# Patient Record
Sex: Female | Born: 1960 | Race: Black or African American | Hispanic: No | Marital: Single | Smoking: Never smoker
Health system: Southern US, Community
[De-identification: ages and names within clinical notes are randomized; demographics above are authoritative.]

## PROBLEM LIST (undated history)

## (undated) DIAGNOSIS — R0600 Dyspnea, unspecified: Secondary | ICD-10-CM

## (undated) DIAGNOSIS — K219 Gastro-esophageal reflux disease without esophagitis: Secondary | ICD-10-CM

## (undated) DIAGNOSIS — G4730 Sleep apnea, unspecified: Secondary | ICD-10-CM

## (undated) DIAGNOSIS — J189 Pneumonia, unspecified organism: Secondary | ICD-10-CM

## (undated) DIAGNOSIS — F419 Anxiety disorder, unspecified: Secondary | ICD-10-CM

## (undated) DIAGNOSIS — F32A Depression, unspecified: Secondary | ICD-10-CM

## (undated) DIAGNOSIS — M79672 Pain in left foot: Secondary | ICD-10-CM

## (undated) DIAGNOSIS — M7732 Calcaneal spur, left foot: Secondary | ICD-10-CM

## (undated) DIAGNOSIS — I10 Essential (primary) hypertension: Secondary | ICD-10-CM

## (undated) DIAGNOSIS — IMO0001 Reserved for inherently not codable concepts without codable children: Secondary | ICD-10-CM

## (undated) DIAGNOSIS — T7840XA Allergy, unspecified, initial encounter: Secondary | ICD-10-CM

## (undated) DIAGNOSIS — I639 Cerebral infarction, unspecified: Secondary | ICD-10-CM

## (undated) DIAGNOSIS — M7662 Achilles tendinitis, left leg: Secondary | ICD-10-CM

## (undated) DIAGNOSIS — D649 Anemia, unspecified: Secondary | ICD-10-CM

## (undated) DIAGNOSIS — M47816 Spondylosis without myelopathy or radiculopathy, lumbar region: Secondary | ICD-10-CM

## (undated) DIAGNOSIS — I499 Cardiac arrhythmia, unspecified: Secondary | ICD-10-CM

## (undated) DIAGNOSIS — Z6841 Body Mass Index (BMI) 40.0 and over, adult: Secondary | ICD-10-CM

## (undated) DIAGNOSIS — E669 Obesity, unspecified: Secondary | ICD-10-CM

## (undated) DIAGNOSIS — I82401 Acute embolism and thrombosis of unspecified deep veins of right lower extremity: Secondary | ICD-10-CM

## (undated) DIAGNOSIS — J45909 Unspecified asthma, uncomplicated: Secondary | ICD-10-CM

## (undated) DIAGNOSIS — M216X1 Other acquired deformities of right foot: Secondary | ICD-10-CM

## (undated) DIAGNOSIS — E785 Hyperlipidemia, unspecified: Secondary | ICD-10-CM

## (undated) DIAGNOSIS — M1990 Unspecified osteoarthritis, unspecified site: Secondary | ICD-10-CM

## (undated) DIAGNOSIS — E66813 Obesity, class 3: Secondary | ICD-10-CM

## (undated) DIAGNOSIS — R7303 Prediabetes: Secondary | ICD-10-CM

## (undated) HISTORY — DX: Anemia, unspecified: D64.9

## (undated) HISTORY — PX: ABDOMINAL HYSTERECTOMY: SHX81

## (undated) HISTORY — PX: KELOID EXCISION: SHX1856

## (undated) HISTORY — DX: Allergy, unspecified, initial encounter: T78.40XA

## (undated) HISTORY — DX: Obesity, unspecified: E66.9

## (undated) HISTORY — PX: HEEL SPUR EXCISION: SHX1733

## (undated) HISTORY — DX: Essential (primary) hypertension: I10

## (undated) HISTORY — DX: Gastro-esophageal reflux disease without esophagitis: K21.9

## (undated) HISTORY — DX: Acute embolism and thrombosis of unspecified deep veins of right lower extremity: I82.401

## (undated) HISTORY — PX: HERNIA REPAIR: SHX51

## (undated) HISTORY — DX: Spondylosis without myelopathy or radiculopathy, lumbar region: M47.816

## (undated) HISTORY — DX: Hyperlipidemia, unspecified: E78.5

## (undated) HISTORY — DX: Reserved for inherently not codable concepts without codable children: IMO0001

## (undated) HISTORY — PX: CHOLECYSTECTOMY: SHX55

---

## 2005-01-11 ENCOUNTER — Emergency Department: Payer: Self-pay | Admitting: Emergency Medicine

## 2005-01-19 ENCOUNTER — Ambulatory Visit: Payer: Self-pay | Admitting: Unknown Physician Specialty

## 2005-01-26 ENCOUNTER — Ambulatory Visit: Payer: Self-pay | Admitting: Family Medicine

## 2006-02-12 ENCOUNTER — Emergency Department: Payer: Self-pay | Admitting: General Practice

## 2006-04-18 ENCOUNTER — Other Ambulatory Visit: Payer: Self-pay

## 2006-04-18 ENCOUNTER — Inpatient Hospital Stay: Payer: Self-pay

## 2006-11-26 ENCOUNTER — Emergency Department: Payer: Self-pay | Admitting: Unknown Physician Specialty

## 2006-12-13 ENCOUNTER — Ambulatory Visit: Payer: Self-pay

## 2007-02-22 ENCOUNTER — Emergency Department (HOSPITAL_COMMUNITY): Admission: EM | Admit: 2007-02-22 | Discharge: 2007-02-22 | Payer: Self-pay | Admitting: Emergency Medicine

## 2007-06-06 ENCOUNTER — Emergency Department (HOSPITAL_COMMUNITY): Admission: EM | Admit: 2007-06-06 | Discharge: 2007-06-06 | Payer: Self-pay | Admitting: Emergency Medicine

## 2007-07-14 ENCOUNTER — Encounter (INDEPENDENT_AMBULATORY_CARE_PROVIDER_SITE_OTHER): Payer: Self-pay | Admitting: Nurse Practitioner

## 2007-07-21 ENCOUNTER — Ambulatory Visit: Payer: Self-pay | Admitting: Nurse Practitioner

## 2007-07-21 DIAGNOSIS — L910 Hypertrophic scar: Secondary | ICD-10-CM | POA: Insufficient documentation

## 2007-07-21 DIAGNOSIS — J45909 Unspecified asthma, uncomplicated: Secondary | ICD-10-CM | POA: Insufficient documentation

## 2007-07-21 DIAGNOSIS — I10 Essential (primary) hypertension: Secondary | ICD-10-CM | POA: Insufficient documentation

## 2007-09-06 ENCOUNTER — Ambulatory Visit: Payer: Self-pay | Admitting: Nurse Practitioner

## 2007-09-06 DIAGNOSIS — K219 Gastro-esophageal reflux disease without esophagitis: Secondary | ICD-10-CM

## 2007-09-06 LAB — CONVERTED CEMR LAB
ALT: 14 units/L (ref 0–35)
AST: 16 units/L (ref 0–37)
Alkaline Phosphatase: 48 units/L (ref 39–117)
Basophils Absolute: 0 10*3/uL (ref 0.0–0.1)
Chloride: 105 meq/L (ref 96–112)
Creatinine, Ser: 0.87 mg/dL (ref 0.40–1.20)
Eosinophils Absolute: 0.2 10*3/uL (ref 0.0–0.7)
Eosinophils Relative: 4 % (ref 0–5)
HDL: 64 mg/dL (ref >39)
LDL Cholesterol: 114 mg/dL — ABNORMAL HIGH (ref 0–99)
Lymphs Abs: 2.1 10*3/uL (ref 0.7–4.0)
MCHC: 33 g/dL (ref 30.0–36.0)
MCV: 80.1 fL (ref 78.0–100.0)
Monocytes Absolute: 0.4 10*3/uL (ref 0.1–1.0)
Monocytes Relative: 8 % (ref 3–12)
Neutro Abs: 2 10*3/uL (ref 1.7–7.7)
Neutrophils Relative %: 42 % — ABNORMAL LOW (ref 43–77)
Platelets: 225 10*3/uL (ref 150–400)
Potassium: 4.2 meq/L (ref 3.5–5.3)
RBC: 4.47 M/uL (ref 3.87–5.11)
RDW: 17.1 % — ABNORMAL HIGH (ref 11.5–15.5)
Total CHOL/HDL Ratio: 3
Triglycerides: 71 mg/dL (ref <150)
VLDL: 14 mg/dL (ref 0–40)

## 2007-10-31 ENCOUNTER — Ambulatory Visit (HOSPITAL_COMMUNITY): Admission: RE | Admit: 2007-10-31 | Discharge: 2007-10-31 | Payer: Self-pay | Admitting: Nurse Practitioner

## 2007-11-10 ENCOUNTER — Encounter (INDEPENDENT_AMBULATORY_CARE_PROVIDER_SITE_OTHER): Payer: Self-pay | Admitting: Nurse Practitioner

## 2007-11-15 ENCOUNTER — Ambulatory Visit: Payer: Self-pay | Admitting: Nurse Practitioner

## 2007-11-15 DIAGNOSIS — D649 Anemia, unspecified: Secondary | ICD-10-CM

## 2007-11-15 DIAGNOSIS — M1710 Unilateral primary osteoarthritis, unspecified knee: Secondary | ICD-10-CM

## 2007-11-20 ENCOUNTER — Ambulatory Visit (HOSPITAL_COMMUNITY): Admission: RE | Admit: 2007-11-20 | Discharge: 2007-11-20 | Payer: Self-pay | Admitting: Family Medicine

## 2007-11-20 DIAGNOSIS — IMO0002 Reserved for concepts with insufficient information to code with codable children: Secondary | ICD-10-CM | POA: Insufficient documentation

## 2007-11-27 ENCOUNTER — Ambulatory Visit: Payer: Self-pay | Admitting: Nurse Practitioner

## 2007-11-27 DIAGNOSIS — F329 Major depressive disorder, single episode, unspecified: Secondary | ICD-10-CM

## 2007-11-28 ENCOUNTER — Telehealth (INDEPENDENT_AMBULATORY_CARE_PROVIDER_SITE_OTHER): Payer: Self-pay | Admitting: Nurse Practitioner

## 2007-12-07 ENCOUNTER — Encounter (INDEPENDENT_AMBULATORY_CARE_PROVIDER_SITE_OTHER): Payer: Self-pay | Admitting: Nurse Practitioner

## 2007-12-08 ENCOUNTER — Ambulatory Visit: Payer: Self-pay | Admitting: Nurse Practitioner

## 2007-12-21 ENCOUNTER — Ambulatory Visit: Payer: Self-pay | Admitting: Nurse Practitioner

## 2007-12-26 ENCOUNTER — Ambulatory Visit: Payer: Self-pay | Admitting: *Deleted

## 2008-01-05 ENCOUNTER — Ambulatory Visit: Payer: Self-pay | Admitting: Nurse Practitioner

## 2008-01-16 ENCOUNTER — Telehealth (INDEPENDENT_AMBULATORY_CARE_PROVIDER_SITE_OTHER): Payer: Self-pay | Admitting: Nurse Practitioner

## 2008-02-01 ENCOUNTER — Encounter (INDEPENDENT_AMBULATORY_CARE_PROVIDER_SITE_OTHER): Payer: Self-pay | Admitting: Nurse Practitioner

## 2008-02-19 ENCOUNTER — Telehealth (INDEPENDENT_AMBULATORY_CARE_PROVIDER_SITE_OTHER): Payer: Self-pay | Admitting: Nurse Practitioner

## 2008-02-23 ENCOUNTER — Ambulatory Visit: Payer: Self-pay | Admitting: Nurse Practitioner

## 2008-02-23 DIAGNOSIS — K5289 Other specified noninfective gastroenteritis and colitis: Secondary | ICD-10-CM | POA: Insufficient documentation

## 2008-02-26 ENCOUNTER — Encounter (INDEPENDENT_AMBULATORY_CARE_PROVIDER_SITE_OTHER): Payer: Self-pay | Admitting: Nurse Practitioner

## 2008-02-26 ENCOUNTER — Ambulatory Visit: Payer: Self-pay | Admitting: Internal Medicine

## 2008-02-28 ENCOUNTER — Telehealth (INDEPENDENT_AMBULATORY_CARE_PROVIDER_SITE_OTHER): Payer: Self-pay | Admitting: Nurse Practitioner

## 2008-03-13 ENCOUNTER — Other Ambulatory Visit: Payer: Self-pay | Admitting: Orthopedic Surgery

## 2008-03-14 ENCOUNTER — Ambulatory Visit (HOSPITAL_BASED_OUTPATIENT_CLINIC_OR_DEPARTMENT_OTHER): Admission: RE | Admit: 2008-03-14 | Discharge: 2008-03-14 | Payer: Self-pay | Admitting: Orthopedic Surgery

## 2008-03-15 ENCOUNTER — Telehealth (INDEPENDENT_AMBULATORY_CARE_PROVIDER_SITE_OTHER): Payer: Self-pay | Admitting: Nurse Practitioner

## 2008-03-20 ENCOUNTER — Ambulatory Visit: Payer: Self-pay

## 2008-03-29 ENCOUNTER — Telehealth (INDEPENDENT_AMBULATORY_CARE_PROVIDER_SITE_OTHER): Payer: Self-pay | Admitting: Nurse Practitioner

## 2008-04-04 ENCOUNTER — Encounter: Admission: RE | Admit: 2008-04-04 | Discharge: 2008-05-29 | Payer: Self-pay | Admitting: Orthopedic Surgery

## 2008-04-24 ENCOUNTER — Telehealth (INDEPENDENT_AMBULATORY_CARE_PROVIDER_SITE_OTHER): Payer: Self-pay | Admitting: Nurse Practitioner

## 2008-04-30 ENCOUNTER — Ambulatory Visit: Payer: Self-pay | Admitting: Nurse Practitioner

## 2008-05-02 ENCOUNTER — Telehealth (INDEPENDENT_AMBULATORY_CARE_PROVIDER_SITE_OTHER): Payer: Self-pay | Admitting: Nurse Practitioner

## 2008-05-07 ENCOUNTER — Encounter (INDEPENDENT_AMBULATORY_CARE_PROVIDER_SITE_OTHER): Payer: Self-pay | Admitting: Nurse Practitioner

## 2008-05-07 DIAGNOSIS — K921 Melena: Secondary | ICD-10-CM

## 2008-05-07 LAB — CONVERTED CEMR LAB
OCCULT 1: POSITIVE
OCCULT 2: POSITIVE

## 2008-05-20 ENCOUNTER — Ambulatory Visit: Payer: Self-pay | Admitting: Internal Medicine

## 2008-05-30 ENCOUNTER — Encounter: Payer: Self-pay | Admitting: Internal Medicine

## 2008-05-30 ENCOUNTER — Ambulatory Visit: Payer: Self-pay | Admitting: Internal Medicine

## 2008-05-31 ENCOUNTER — Encounter: Payer: Self-pay | Admitting: Internal Medicine

## 2008-06-08 ENCOUNTER — Emergency Department (HOSPITAL_COMMUNITY): Admission: EM | Admit: 2008-06-08 | Discharge: 2008-06-09 | Payer: Self-pay | Admitting: Emergency Medicine

## 2008-06-19 ENCOUNTER — Ambulatory Visit: Payer: Self-pay | Admitting: Nurse Practitioner

## 2008-06-21 ENCOUNTER — Telehealth (INDEPENDENT_AMBULATORY_CARE_PROVIDER_SITE_OTHER): Payer: Self-pay | Admitting: Nurse Practitioner

## 2008-07-12 ENCOUNTER — Ambulatory Visit: Payer: Self-pay | Admitting: Nurse Practitioner

## 2008-07-12 LAB — CONVERTED CEMR LAB
Bilirubin Urine: NEGATIVE
Ketones, urine, test strip: NEGATIVE
Specific Gravity, Urine: 1.015
pH: 5

## 2008-07-13 ENCOUNTER — Encounter (INDEPENDENT_AMBULATORY_CARE_PROVIDER_SITE_OTHER): Payer: Self-pay | Admitting: Nurse Practitioner

## 2008-07-15 ENCOUNTER — Telehealth (INDEPENDENT_AMBULATORY_CARE_PROVIDER_SITE_OTHER): Payer: Self-pay | Admitting: *Deleted

## 2008-07-22 ENCOUNTER — Encounter (INDEPENDENT_AMBULATORY_CARE_PROVIDER_SITE_OTHER): Payer: Self-pay | Admitting: Nurse Practitioner

## 2008-08-05 ENCOUNTER — Encounter (INDEPENDENT_AMBULATORY_CARE_PROVIDER_SITE_OTHER): Payer: Self-pay | Admitting: Nurse Practitioner

## 2008-09-13 ENCOUNTER — Telehealth (INDEPENDENT_AMBULATORY_CARE_PROVIDER_SITE_OTHER): Payer: Self-pay | Admitting: Nurse Practitioner

## 2008-09-16 ENCOUNTER — Telehealth (INDEPENDENT_AMBULATORY_CARE_PROVIDER_SITE_OTHER): Payer: Self-pay | Admitting: Nurse Practitioner

## 2008-09-18 ENCOUNTER — Emergency Department (HOSPITAL_COMMUNITY): Admission: EM | Admit: 2008-09-18 | Discharge: 2008-09-18 | Payer: Self-pay | Admitting: Emergency Medicine

## 2008-10-02 ENCOUNTER — Encounter (INDEPENDENT_AMBULATORY_CARE_PROVIDER_SITE_OTHER): Payer: Self-pay | Admitting: *Deleted

## 2008-10-03 ENCOUNTER — Ambulatory Visit: Payer: Self-pay | Admitting: Nurse Practitioner

## 2008-10-03 DIAGNOSIS — R5381 Other malaise: Secondary | ICD-10-CM

## 2008-10-03 DIAGNOSIS — R0989 Other specified symptoms and signs involving the circulatory and respiratory systems: Secondary | ICD-10-CM

## 2008-10-03 DIAGNOSIS — R0609 Other forms of dyspnea: Secondary | ICD-10-CM

## 2008-10-03 DIAGNOSIS — R5383 Other fatigue: Secondary | ICD-10-CM

## 2008-10-03 LAB — CONVERTED CEMR LAB: Blood Glucose, Fingerstick: 69

## 2008-10-08 ENCOUNTER — Encounter (INDEPENDENT_AMBULATORY_CARE_PROVIDER_SITE_OTHER): Payer: Self-pay | Admitting: Nurse Practitioner

## 2008-10-08 ENCOUNTER — Telehealth (INDEPENDENT_AMBULATORY_CARE_PROVIDER_SITE_OTHER): Payer: Self-pay | Admitting: *Deleted

## 2008-10-10 ENCOUNTER — Ambulatory Visit (HOSPITAL_BASED_OUTPATIENT_CLINIC_OR_DEPARTMENT_OTHER): Admission: RE | Admit: 2008-10-10 | Discharge: 2008-10-10 | Payer: Self-pay | Admitting: Nurse Practitioner

## 2008-10-10 ENCOUNTER — Encounter (INDEPENDENT_AMBULATORY_CARE_PROVIDER_SITE_OTHER): Payer: Self-pay | Admitting: Nurse Practitioner

## 2008-10-13 ENCOUNTER — Ambulatory Visit: Payer: Self-pay | Admitting: Internal Medicine

## 2008-10-18 ENCOUNTER — Encounter (INDEPENDENT_AMBULATORY_CARE_PROVIDER_SITE_OTHER): Payer: Self-pay | Admitting: Nurse Practitioner

## 2008-10-21 ENCOUNTER — Telehealth (INDEPENDENT_AMBULATORY_CARE_PROVIDER_SITE_OTHER): Payer: Self-pay | Admitting: Nurse Practitioner

## 2009-04-23 ENCOUNTER — Ambulatory Visit: Payer: Self-pay | Admitting: Family Medicine

## 2009-08-25 ENCOUNTER — Encounter: Payer: Self-pay | Admitting: Family Medicine

## 2009-09-06 ENCOUNTER — Encounter: Payer: Self-pay | Admitting: Family Medicine

## 2009-11-28 ENCOUNTER — Emergency Department: Payer: Self-pay | Admitting: Emergency Medicine

## 2010-04-29 ENCOUNTER — Ambulatory Visit: Payer: Self-pay | Admitting: Certified Nurse Midwife

## 2010-07-07 ENCOUNTER — Ambulatory Visit: Payer: Self-pay | Admitting: Family Medicine

## 2010-09-30 ENCOUNTER — Ambulatory Visit: Payer: Self-pay | Admitting: Family Medicine

## 2011-01-19 NOTE — Procedures (Signed)
NAME:  Lindsay Cooke, Lindsay Cooke                ACCOUNT NO.:  [id number]   MEDICAL RECORD NO.:  [id number]          PATIENT TYPE:  OUT   LOCATION:  SLEEP CENTER                 FACILITY:  [facility]   PHYSICIAN:  Clinton D. Maple Hudson, MD, FCCP, FACPDATE OF BIRTH:  03-15-63   DATE OF STUDY:  10/10/2008                            NOCTURNAL POLYSOMNOGRAM   REFERRING PHYSICIAN:  Lehman Prom, NP   INDICATION FOR STUDY:  Insomnia with sleep apnea.   EPWORTH SLEEPINESS SCORE:  6/24.  BMI 43.3, weight 252 pounds, height 64  inches, neck 15 inches.   MEDICATIONS:  Home medications charted and reviewed.   SLEEP ARCHITECTURE:  Total sleep time 262 minutes with sleep efficiency  67%.  Stage I was 29.7%.  Stage II 70.3%.  Stage III and REM were  absent.  Sleep latency 59 minutes.  Awake after sleep onset 72 minutes.  Arousal index 7.1.  No bedtime medication was reported.  She slept  intermittently from slept onset at 11:10 p.m. until better able to  sustain sleep after 12:30 a.m.  She then woke with only minimal further  sleep after 4:30 a.m.   RESPIRATORY DATA:  Apnea-hypopnea index (AHI) 2.3 per hour.  A total of  10 events was scored, all hypopneas recorded while sleeping supine.  There were insufficient events to permit CPAP titration by split  protocol on the study night.   OXYGEN DATA:  Moderate snoring with oxygen desaturation to a nadir of  90%.  Mean oxygen saturation through the study was 96.2% on room air.   CARDIAC DATA:  Normal sinus rhythm.   MOVEMENT-PARASOMNIA:  Periodic limb movement with arousal.  A total of  21 events was counted associated with arousal or awakening an average of  4.8 times per hour.   IMPRESSIONS-RECOMMENDATIONS:  1. Difficulty initiating and maintaining sleep consistent with the      patient's history, but not obviously associated with significant      respiratory disturbance.  Consider addressing this primarily with      attention to good sleep hygiene and  management of insomnia.  2. Occasional respiratory disturbance events effecting sleep, AHI 2.3      per hour (normal range 0 to 5 per hour).  Moderate snoring.  All of      the events were recorded while supine.  Oxygen desaturation to a      nadir of 90%.  3. Periodic limb movement with arousal, 4.8 per hour.  There may be      some benefit to a therapeutic trial of Requip or Mirapex if      clinically appropriate.      Clinton D. Maple Hudson, MD, [facility], FACP  Diplomate, Biomedical engineer of Sleep Medicine  Electronically Signed     CDY/MEDQ  D:  10/12/2008 11:19:10  T:  10/13/2008 05:20:35  Job:  [id number]

## 2011-01-19 NOTE — Op Note (Signed)
NAME:  Lindsay Cooke, Lindsay Cooke                ACCOUNT NO.:  [id number]   MEDICAL RECORD NO.:  [id number]          PATIENT TYPE:  AMB   LOCATION:  DSC                          FACILITY:  [facility]   PHYSICIAN:  Rodney A. Mortenson, M.D.DATE OF BIRTH:  Jan 26, 1961   DATE OF PROCEDURE:  03/14/2008  DATE OF DISCHARGE:                               OPERATIVE REPORT   JUSTIFICATION:  A 50 year old female with 2-year history of increasing  pain in the right knee.  Recently had marked increased pain along the  medial joint line.  Trouble with stairs, rolling over in bed was quite  painful.  There is acute tenderness along the post medial corner of the  knee.  No lateral joint line tenderness, no fluid in the knee.  The knee  was very large and the MRI was done.  This shows degenerative changes  especially in the medial part and a large tear of the posterior horn of  the medial meniscus.  Because of persistent pain and discomfort,  selective arthroscopic evaluation and treatment was indicated necessary.  Complications were discussed preoperatively.  Questions were answered  and encouraged and the patient wished to proceed.   Justification outpatient surgical and minimum morbidity.   PREOPERATIVE DIAGNOSES:  Chondromalacia medial patellar facet; tear  posterior attachment medial meniscus.   POSTOPERATIVE DIAGNOSES:  Chondromalacia medial patellar facet, right  knee; tear posterior attachment medial meniscus, right knee; large  tibial spine osteophyte with impingement into the femoral notch area;  early osteoarthritis medial femoral condyle.   PROCEDURES:  Arthroscopic chondroplasty medial patellar facet; debride  posterior horn of the medial meniscus; osteotomy tibial spine osteophyte  and removal.   SURGEON:  Rodney A. Chaney Malling, MD   ANESTHESIA:  MAC.   PATHOLOGY:  With an arthroscope, a very careful examination was  undertaken.  There was fraying and tearing of the medial patellar facet  with  grade 2 and grade 3 changes.  The femoral notch area showed some  early changes of early osteoarthritis.  Over the medial femoral condyle,  there was grade 2 loss of articular cartilage.  The arthroscope was then  placed into the medial compartment and the anterior two-thirds of the  medial meniscus was absolutely normal, but there was fraying and tearing  of the posterior attachment of the medial meniscus.  With the  arthroscope into the intercondylar notch area, an anterior cruciate  ligament was normal, but there was a huge tibial spine osteophyte, and  on full extension, this osteophyte impacted the femoral notch area.  Again, the lateral compartment with normal articular cartilage of the  lateral femoral condyle, lateral tibial plateau, and entire  circumference of the lateral meniscus was normal.   PROCEDURE:  The patient was placed on the operating table in supine  position.  Pneumatic tourniquet brought above the right upper thigh.  The right leg was placed in a leg holder.  The entire right lower  extremity was prepped with DuraPrep and draped out in the usual manner.  An infusion can was placed in the superior medial pouch and the knee  distended with  saline.  Anteromedial and lateral portals were made.  The  arthroscope was introduced.  As noted above, there was fraying and  tearing of articular cartilage of the medial patellar facet and using  the chondroplasty shaver, this area was debrided very nicely.  The  arthroscope was then passed into the lateral compartment fairly normal  structures were seen.  In the medial compartment, there was early  osteoarthritis and loss of articular cartilage of the medial femoral  condyle, but a tear of the posterior horn.  This was debrided through  both portals with small baskets.  Once this was debrided to my  satisfaction, intra-articular shaver was introduced and the remaining  rim was then smoothed and balanced with nice transition mid  third of the  medial meniscus.  The anterior two-thirds of meniscus was preserved.  The arthroscope was then brought into the intercondylar notch area, and  there was a huge tibial spine osteophyte with full extension, impinged  on the intercondylar notch area and  blocked in full extension.  The  base of the osteophyte was then osteotomized with a small osteotome and  mallet.  Once this was freed up, it was grabbed with the pituitary and  removed through the anterior portal.  The base was then debrided with  chondroplasty shaver.  Excellent decompression of tibial spine and the  posterior attachment of the medial meniscus was achieved.  The knee was  then filled with Marcaine.  The patient has a history of severe keloid  formation and the wounds were closed with 4-0 nylon suture in an attempt  to minimize keloid formation.  Marcaine was placed in the knee.  A large  bulky pressure dressing applied and the patient returned to recovery  room in excellent condition.  Technically, this procedure went extremely  well.   DISPOSITION:  1. Usual postoperative instructions.  2. To my office next week on Wednesday.  3. Percocet for pain.      Rodney A. Chaney Malling, M.D.  Electronically Signed     RAM/MEDQ  D:  03/14/2008  T:  03/14/2008  Job:  [id number]

## 2011-03-26 ENCOUNTER — Ambulatory Visit: Payer: Self-pay | Admitting: Family Medicine

## 2011-04-02 ENCOUNTER — Ambulatory Visit: Payer: Self-pay | Admitting: Family Medicine

## 2011-04-06 ENCOUNTER — Ambulatory Visit: Payer: Self-pay | Admitting: Family Medicine

## 2011-04-22 ENCOUNTER — Ambulatory Visit: Payer: Self-pay | Admitting: Family Medicine

## 2011-04-30 ENCOUNTER — Emergency Department: Payer: Self-pay | Admitting: Emergency Medicine

## 2011-06-03 LAB — BASIC METABOLIC PANEL
CO2: 27
GFR calc non Af Amer: 52 — ABNORMAL LOW
Sodium: 142

## 2011-06-03 LAB — POCT HEMOGLOBIN-HEMACUE: Hemoglobin: 11.5 — ABNORMAL LOW

## 2011-06-07 LAB — POCT I-STAT, CHEM 8
BUN: 22
Creatinine, Ser: 1.5 — ABNORMAL HIGH
Glucose, Bld: 101 — ABNORMAL HIGH
HCT: 38
Hemoglobin: 12.9

## 2011-06-07 LAB — URINALYSIS, ROUTINE W REFLEX MICROSCOPIC
Hgb urine dipstick: NEGATIVE
Ketones, ur: NEGATIVE
Protein, ur: NEGATIVE
pH: 6

## 2011-06-07 LAB — DIFFERENTIAL
Basophils Absolute: 0
Basophils Relative: 1
Eosinophils Relative: 3
Lymphocytes Relative: 37
Monocytes Absolute: 0.6

## 2011-06-07 LAB — POCT CARDIAC MARKERS
Myoglobin, poc: 233
Myoglobin, poc: 93.9

## 2011-06-07 LAB — CBC
HCT: 38.3
Hemoglobin: 12.7
MCHC: 33.2
MCV: 84.4
Platelets: 227
RBC: 4.53
RDW: 18 — ABNORMAL HIGH
WBC: 7

## 2011-06-23 LAB — I-STAT 8, (EC8 V) (CONVERTED LAB)
Acid-Base Excess: 4 — ABNORMAL HIGH
BUN: 12
HCT: 37
Hemoglobin: 12.6
Operator id: 277751
Potassium: 3.6
TCO2: 28

## 2011-06-23 LAB — POCT CARDIAC MARKERS
CKMB, poc: <1 — ABNORMAL LOW
CKMB, poc: <1 — ABNORMAL LOW
Myoglobin, poc: 45.4
Operator id: 277751
Troponin i, poc: <0.05

## 2011-06-23 LAB — D-DIMER, QUANTITATIVE: D-Dimer, Quant: 1.09 — ABNORMAL HIGH

## 2011-07-01 ENCOUNTER — Ambulatory Visit: Payer: Self-pay | Admitting: Family

## 2011-08-05 ENCOUNTER — Ambulatory Visit: Payer: Self-pay | Admitting: Family

## 2011-10-26 ENCOUNTER — Ambulatory Visit: Payer: Self-pay | Admitting: Internal Medicine

## 2011-11-28 ENCOUNTER — Emergency Department: Payer: Self-pay | Admitting: Emergency Medicine

## 2012-08-18 DIAGNOSIS — H9191 Unspecified hearing loss, right ear: Secondary | ICD-10-CM | POA: Insufficient documentation

## 2012-11-11 ENCOUNTER — Emergency Department: Payer: Self-pay | Admitting: Emergency Medicine

## 2012-11-11 LAB — CBC
HCT: 37.9 % (ref 35.0–47.0)
HGB: 12.9 g/dL (ref 12.0–16.0)
MCH: 27.9 pg (ref 26.0–34.0)
MCHC: 34.1 g/dL (ref 32.0–36.0)
Platelet: 211 10*3/uL (ref 150–440)
RDW: 17 % — ABNORMAL HIGH (ref 11.5–14.5)

## 2012-11-11 LAB — LIPASE, BLOOD: Lipase: 156 U/L (ref 73–393)

## 2012-11-11 LAB — COMPREHENSIVE METABOLIC PANEL
Albumin: 4 g/dL (ref 3.4–5.0)
Anion Gap: 6 — ABNORMAL LOW (ref 7–16)
Bilirubin,Total: 0.8 mg/dL (ref 0.2–1.0)
Calcium, Total: 9.3 mg/dL (ref 8.5–10.1)
Chloride: 104 mmol/L (ref 98–107)
Co2: 29 mmol/L (ref 21–32)
Creatinine: 1.06 mg/dL (ref 0.60–1.30)
EGFR (Non-African Amer.): >60
Potassium: 3.6 mmol/L (ref 3.5–5.1)
SGOT(AST): 25 U/L (ref 15–37)
SGPT (ALT): 43 U/L (ref 12–78)
Sodium: 139 mmol/L (ref 136–145)
Total Protein: 7.9 g/dL (ref 6.4–8.2)

## 2012-11-11 LAB — URINALYSIS, COMPLETE
Bilirubin,UR: NEGATIVE
Blood: NEGATIVE
Glucose,UR: NEGATIVE mg/dL (ref 0–75)
Ketone: NEGATIVE
Leukocyte Esterase: NEGATIVE
Nitrite: NEGATIVE
Protein: NEGATIVE
RBC,UR: <1 /HPF (ref 0–5)
Specific Gravity: 1.013 (ref 1.003–1.030)
Squamous Epithelial: 1
WBC UR: 1 /HPF (ref 0–5)

## 2013-01-19 ENCOUNTER — Ambulatory Visit: Payer: Self-pay | Admitting: Family Medicine

## 2013-02-16 ENCOUNTER — Emergency Department: Payer: Self-pay | Admitting: Emergency Medicine

## 2013-02-16 LAB — URINALYSIS, COMPLETE
Blood: NEGATIVE
Glucose,UR: NEGATIVE mg/dL (ref 0–75)
Leukocyte Esterase: NEGATIVE
Protein: NEGATIVE
RBC,UR: <1 /HPF (ref 0–5)
Squamous Epithelial: 3

## 2013-02-16 LAB — COMPREHENSIVE METABOLIC PANEL
BUN: 18 mg/dL (ref 7–18)
Bilirubin,Total: 0.6 mg/dL (ref 0.2–1.0)
Calcium, Total: 8.8 mg/dL (ref 8.5–10.1)
Chloride: 105 mmol/L (ref 98–107)
Co2: 32 mmol/L (ref 21–32)
Creatinine: 1.08 mg/dL (ref 0.60–1.30)
EGFR (Non-African Amer.): >60
Osmolality: 282 (ref 275–301)
Potassium: 3.8 mmol/L (ref 3.5–5.1)
SGPT (ALT): 47 U/L (ref 12–78)
Sodium: 140 mmol/L (ref 136–145)
Total Protein: 6.7 g/dL (ref 6.4–8.2)

## 2013-02-16 LAB — CBC
HCT: 34.9 % — ABNORMAL LOW (ref 35.0–47.0)
HGB: 12 g/dL (ref 12.0–16.0)
MCHC: 34.5 g/dL (ref 32.0–36.0)
MCV: 82 fL (ref 80–100)
RDW: 16.4 % — ABNORMAL HIGH (ref 11.5–14.5)
WBC: 6.4 10*3/uL (ref 3.6–11.0)

## 2013-10-08 ENCOUNTER — Other Ambulatory Visit: Payer: Self-pay | Admitting: *Deleted

## 2013-10-08 MED ORDER — MELOXICAM 15 MG PO TABS: 15.0000 mg | ORAL_TABLET | Freq: Every day | ORAL | Status: DC

## 2013-10-08 NOTE — Telephone Encounter (Signed)
RECEIVED FAX REQUEST FOR MOBIC 15MG   FROM PHARMACY

## 2013-10-10 ENCOUNTER — Emergency Department: Payer: Self-pay | Admitting: Emergency Medicine

## 2013-10-10 LAB — COMPREHENSIVE METABOLIC PANEL
ALK PHOS: 62 U/L
ANION GAP: 7 (ref 7–16)
Albumin: 4.1 g/dL (ref 3.4–5.0)
BUN: 9 mg/dL (ref 7–18)
Bilirubin,Total: 0.9 mg/dL (ref 0.2–1.0)
CHLORIDE: 102 mmol/L (ref 98–107)
CREATININE: 0.99 mg/dL (ref 0.60–1.30)
Calcium, Total: 9.4 mg/dL (ref 8.5–10.1)
Co2: 29 mmol/L (ref 21–32)
EGFR (African American): >60
EGFR (Non-African Amer.): >60
Glucose: 137 mg/dL — ABNORMAL HIGH (ref 65–99)
OSMOLALITY: 277 (ref 275–301)
POTASSIUM: 3.7 mmol/L (ref 3.5–5.1)
SGOT(AST): 15 U/L (ref 15–37)
SGPT (ALT): 23 U/L (ref 12–78)
Sodium: 138 mmol/L (ref 136–145)
Total Protein: 8 g/dL (ref 6.4–8.2)

## 2013-10-10 LAB — CBC WITH DIFFERENTIAL/PLATELET
BASOS PCT: 1.1 %
Basophil #: 0.1 10*3/uL (ref 0.0–0.1)
EOS ABS: 0.3 10*3/uL (ref 0.0–0.7)
Eosinophil %: 3.1 %
HCT: 37.2 % (ref 35.0–47.0)
HGB: 12.7 g/dL (ref 12.0–16.0)
LYMPHS ABS: 2.4 10*3/uL (ref 1.0–3.6)
LYMPHS PCT: 23.1 %
MCH: 28.2 pg (ref 26.0–34.0)
MCHC: 34.1 g/dL (ref 32.0–36.0)
MCV: 83 fL (ref 80–100)
Monocyte #: 0.8 x10 3/mm (ref 0.2–0.9)
Monocyte %: 7.6 %
NEUTROS PCT: 65.1 %
Neutrophil #: 6.9 10*3/uL — ABNORMAL HIGH (ref 1.4–6.5)
PLATELETS: 246 10*3/uL (ref 150–440)
RBC: 4.5 10*6/uL (ref 3.80–5.20)
RDW: 16.9 % — ABNORMAL HIGH (ref 11.5–14.5)
WBC: 10.6 10*3/uL (ref 3.6–11.0)

## 2013-10-10 LAB — URINALYSIS, COMPLETE
BILIRUBIN, UR: NEGATIVE
Bacteria: NONE SEEN
Blood: NEGATIVE
Glucose,UR: NEGATIVE mg/dL (ref 0–75)
KETONE: NEGATIVE
Leukocyte Esterase: NEGATIVE
Nitrite: NEGATIVE
Ph: 8 (ref 4.5–8.0)
Protein: NEGATIVE
RBC,UR: <1 /HPF (ref 0–5)
SPECIFIC GRAVITY: 1.015 (ref 1.003–1.030)
WBC UR: <1 /HPF (ref 0–5)

## 2013-10-10 LAB — LIPASE, BLOOD: LIPASE: 125 U/L (ref 73–393)

## 2013-10-12 ENCOUNTER — Emergency Department: Payer: Self-pay | Admitting: Emergency Medicine

## 2013-10-12 LAB — CBC
HCT: 32.3 % — ABNORMAL LOW (ref 35.0–47.0)
HGB: 11.2 g/dL — ABNORMAL LOW (ref 12.0–16.0)
MCH: 28.8 pg (ref 26.0–34.0)
MCHC: 34.6 g/dL (ref 32.0–36.0)
MCV: 83 fL (ref 80–100)
Platelet: 207 10*3/uL (ref 150–440)
RBC: 3.88 10*6/uL (ref 3.80–5.20)
RDW: 16.7 % — ABNORMAL HIGH (ref 11.5–14.5)
WBC: 11.1 10*3/uL — AB (ref 3.6–11.0)

## 2013-10-12 LAB — COMPREHENSIVE METABOLIC PANEL
Albumin: 3.6 g/dL (ref 3.4–5.0)
Alkaline Phosphatase: 95 U/L
Anion Gap: 5 — ABNORMAL LOW (ref 7–16)
BUN: 8 mg/dL (ref 7–18)
Bilirubin,Total: 1.1 mg/dL — ABNORMAL HIGH (ref 0.2–1.0)
CO2: 30 mmol/L (ref 21–32)
CREATININE: 0.94 mg/dL (ref 0.60–1.30)
Calcium, Total: 9 mg/dL (ref 8.5–10.1)
Chloride: 100 mmol/L (ref 98–107)
EGFR (Non-African Amer.): >60
Glucose: 136 mg/dL — ABNORMAL HIGH (ref 65–99)
Osmolality: 271 (ref 275–301)
Potassium: 4.2 mmol/L (ref 3.5–5.1)
SGOT(AST): 69 U/L — ABNORMAL HIGH (ref 15–37)
SGPT (ALT): 65 U/L (ref 12–78)
Sodium: 135 mmol/L — ABNORMAL LOW (ref 136–145)
TOTAL PROTEIN: 8 g/dL (ref 6.4–8.2)

## 2013-10-12 LAB — LIPASE, BLOOD: Lipase: 87 U/L (ref 73–393)

## 2013-10-16 ENCOUNTER — Inpatient Hospital Stay: Payer: Self-pay | Admitting: Surgery

## 2013-10-16 LAB — CBC WITH DIFFERENTIAL/PLATELET
BASOS ABS: 0.1 10*3/uL (ref 0.0–0.1)
BASOS PCT: 1.6 %
EOS ABS: 0.3 10*3/uL (ref 0.0–0.7)
Eosinophil %: 3.9 %
HCT: 33.3 % — ABNORMAL LOW (ref 35.0–47.0)
HGB: 11.4 g/dL — ABNORMAL LOW (ref 12.0–16.0)
LYMPHS PCT: 38.1 %
Lymphocyte #: 2.8 10*3/uL (ref 1.0–3.6)
MCH: 28.4 pg (ref 26.0–34.0)
MCHC: 34.3 g/dL (ref 32.0–36.0)
MCV: 83 fL (ref 80–100)
Monocyte #: 0.6 x10 3/mm (ref 0.2–0.9)
Monocyte %: 7.7 %
Neutrophil #: 3.6 10*3/uL (ref 1.4–6.5)
Neutrophil %: 48.7 %
PLATELETS: 318 10*3/uL (ref 150–440)
RBC: 4.01 10*6/uL (ref 3.80–5.20)
RDW: 16.4 % — ABNORMAL HIGH (ref 11.5–14.5)
WBC: 7.3 10*3/uL (ref 3.6–11.0)

## 2013-10-16 LAB — COMPREHENSIVE METABOLIC PANEL
Albumin: 3.3 g/dL — ABNORMAL LOW (ref 3.4–5.0)
Alkaline Phosphatase: 159 U/L — ABNORMAL HIGH
Anion Gap: 5 — ABNORMAL LOW (ref 7–16)
BUN: 12 mg/dL (ref 7–18)
Bilirubin,Total: 0.5 mg/dL (ref 0.2–1.0)
CO2: 33 mmol/L — AB (ref 21–32)
Calcium, Total: 9.9 mg/dL (ref 8.5–10.1)
Chloride: 101 mmol/L (ref 98–107)
Creatinine: 1.02 mg/dL (ref 0.60–1.30)
EGFR (Non-African Amer.): >60
Glucose: 101 mg/dL — ABNORMAL HIGH (ref 65–99)
Osmolality: 277 (ref 275–301)
POTASSIUM: 3.6 mmol/L (ref 3.5–5.1)
SGOT(AST): 28 U/L (ref 15–37)
SGPT (ALT): 64 U/L (ref 12–78)
Sodium: 139 mmol/L (ref 136–145)
Total Protein: 8.3 g/dL — ABNORMAL HIGH (ref 6.4–8.2)

## 2013-10-18 LAB — CBC WITH DIFFERENTIAL/PLATELET
BASOS ABS: 0.1 10*3/uL (ref 0.0–0.1)
Basophil %: 1.2 %
Eosinophil #: 0.3 10*3/uL (ref 0.0–0.7)
Eosinophil %: 4.8 %
HCT: 33 % — AB (ref 35.0–47.0)
HGB: 11.2 g/dL — ABNORMAL LOW (ref 12.0–16.0)
Lymphocyte #: 3 10*3/uL (ref 1.0–3.6)
Lymphocyte %: 44 %
MCH: 28.4 pg (ref 26.0–34.0)
MCHC: 34 g/dL (ref 32.0–36.0)
MCV: 83 fL (ref 80–100)
Monocyte #: 0.5 x10 3/mm (ref 0.2–0.9)
Monocyte %: 7.8 %
Neutrophil #: 2.8 10*3/uL (ref 1.4–6.5)
Neutrophil %: 42.2 %
PLATELETS: 321 10*3/uL (ref 150–440)
RBC: 3.96 10*6/uL (ref 3.80–5.20)
RDW: 16.9 % — ABNORMAL HIGH (ref 11.5–14.5)
WBC: 6.8 10*3/uL (ref 3.6–11.0)

## 2013-10-18 LAB — COMPREHENSIVE METABOLIC PANEL
Albumin: 3.1 g/dL — ABNORMAL LOW (ref 3.4–5.0)
Alkaline Phosphatase: 131 U/L — ABNORMAL HIGH
Anion Gap: 7 (ref 7–16)
BUN: 7 mg/dL (ref 7–18)
Bilirubin,Total: 0.4 mg/dL (ref 0.2–1.0)
CHLORIDE: 103 mmol/L (ref 98–107)
CO2: 28 mmol/L (ref 21–32)
CREATININE: 0.97 mg/dL (ref 0.60–1.30)
Calcium, Total: 9.6 mg/dL (ref 8.5–10.1)
EGFR (African American): >60
EGFR (Non-African Amer.): >60
Glucose: 125 mg/dL — ABNORMAL HIGH (ref 65–99)
Osmolality: 275 (ref 275–301)
Potassium: 3.7 mmol/L (ref 3.5–5.1)
SGOT(AST): 15 U/L (ref 15–37)
SGPT (ALT): 42 U/L (ref 12–78)
Sodium: 138 mmol/L (ref 136–145)
Total Protein: 7.5 g/dL (ref 6.4–8.2)

## 2013-10-19 LAB — COMPREHENSIVE METABOLIC PANEL
ALBUMIN: 3 g/dL — AB (ref 3.4–5.0)
ALK PHOS: 115 U/L
Anion Gap: 5 — ABNORMAL LOW (ref 7–16)
BUN: 7 mg/dL (ref 7–18)
Bilirubin,Total: 0.4 mg/dL (ref 0.2–1.0)
CALCIUM: 9 mg/dL (ref 8.5–10.1)
Chloride: 104 mmol/L (ref 98–107)
Co2: 30 mmol/L (ref 21–32)
Creatinine: 1.09 mg/dL (ref 0.60–1.30)
EGFR (Non-African Amer.): 59 — ABNORMAL LOW
Glucose: 99 mg/dL (ref 65–99)
Osmolality: 276 (ref 275–301)
Potassium: 4.1 mmol/L (ref 3.5–5.1)
SGOT(AST): 21 U/L (ref 15–37)
SGPT (ALT): 36 U/L (ref 12–78)
Sodium: 139 mmol/L (ref 136–145)
Total Protein: 7 g/dL (ref 6.4–8.2)

## 2013-10-19 LAB — CBC WITH DIFFERENTIAL/PLATELET
Basophil #: 0.1 10*3/uL (ref 0.0–0.1)
Basophil %: 1.1 %
EOS PCT: 1.2 %
Eosinophil #: 0.1 10*3/uL (ref 0.0–0.7)
HCT: 32 % — AB (ref 35.0–47.0)
HGB: 10.9 g/dL — AB (ref 12.0–16.0)
LYMPHS ABS: 2.7 10*3/uL (ref 1.0–3.6)
LYMPHS PCT: 26.6 %
MCH: 28.3 pg (ref 26.0–34.0)
MCHC: 34.1 g/dL (ref 32.0–36.0)
MCV: 83 fL (ref 80–100)
Monocyte #: 0.7 x10 3/mm (ref 0.2–0.9)
Monocyte %: 7.2 %
NEUTROS ABS: 6.6 10*3/uL — AB (ref 1.4–6.5)
Neutrophil %: 63.9 %
PLATELETS: 347 10*3/uL (ref 150–440)
RBC: 3.86 10*6/uL (ref 3.80–5.20)
RDW: 16.7 % — AB (ref 11.5–14.5)
WBC: 10.3 10*3/uL (ref 3.6–11.0)

## 2013-10-19 LAB — PATHOLOGY REPORT

## 2013-10-21 LAB — CREATININE, SERUM
Creatinine: 0.98 mg/dL (ref 0.60–1.30)
EGFR (African American): >60

## 2013-12-10 ENCOUNTER — Ambulatory Visit: Payer: Self-pay | Admitting: Podiatry

## 2013-12-12 ENCOUNTER — Ambulatory Visit: Payer: Self-pay | Admitting: Podiatry

## 2013-12-24 ENCOUNTER — Ambulatory Visit: Payer: Self-pay | Admitting: Podiatry

## 2014-01-02 ENCOUNTER — Encounter: Payer: Self-pay | Admitting: Podiatry

## 2014-01-02 ENCOUNTER — Ambulatory Visit (INDEPENDENT_AMBULATORY_CARE_PROVIDER_SITE_OTHER): Payer: 59 | Admitting: Podiatry

## 2014-01-02 VITALS — Resp 16 | Ht 61.0 in | Wt 230.0 lb

## 2014-01-02 DIAGNOSIS — R600 Localized edema: Secondary | ICD-10-CM

## 2014-01-02 DIAGNOSIS — R609 Edema, unspecified: Secondary | ICD-10-CM

## 2014-01-02 NOTE — Progress Notes (Signed)
Lindsay Cooke presents today with a chief complaint of swelling to the posterior lateral aspect of the bilateral ankles. She states it is elected significance down.  Objective: Vital signs are stable she is alert and oriented x3 she has no pain on palpation of the ankles. Pulses are strongly palpable there is no venous distention. She has lipomas to the posterior lateral aspect of the peroneal tendons.  Assessment: Lipomas bilateral lateral ankles.  Plan: Followup with me as needed.

## 2014-03-14 ENCOUNTER — Emergency Department: Payer: Self-pay | Admitting: Emergency Medicine

## 2014-03-14 LAB — CBC
HCT: 38 % (ref 35.0–47.0)
HGB: 12.6 g/dL (ref 12.0–16.0)
MCH: 28.3 pg (ref 26.0–34.0)
MCHC: 33 g/dL (ref 32.0–36.0)
MCV: 86 fL (ref 80–100)
Platelet: 264 10*3/uL (ref 150–440)
RBC: 4.44 10*6/uL (ref 3.80–5.20)
RDW: 16.4 % — ABNORMAL HIGH (ref 11.5–14.5)
WBC: 7 10*3/uL (ref 3.6–11.0)

## 2014-03-14 LAB — BASIC METABOLIC PANEL
Anion Gap: 8 (ref 7–16)
BUN: 12 mg/dL (ref 7–18)
CHLORIDE: 104 mmol/L (ref 98–107)
Calcium, Total: 9.1 mg/dL (ref 8.5–10.1)
Co2: 29 mmol/L (ref 21–32)
Creatinine: 1.14 mg/dL (ref 0.60–1.30)
EGFR (African American): >60
EGFR (Non-African Amer.): 56 — ABNORMAL LOW
Glucose: 145 mg/dL — ABNORMAL HIGH (ref 65–99)
OSMOLALITY: 284 (ref 275–301)
Potassium: 3.8 mmol/L (ref 3.5–5.1)
Sodium: 141 mmol/L (ref 136–145)

## 2014-03-14 LAB — TROPONIN I: Troponin-I: <0.02 ng/mL

## 2014-04-05 ENCOUNTER — Ambulatory Visit: Payer: Self-pay | Admitting: Family Medicine

## 2014-04-25 ENCOUNTER — Encounter: Payer: Self-pay | Admitting: Internal Medicine

## 2014-05-03 ENCOUNTER — Ambulatory Visit: Payer: Self-pay | Admitting: Cardiovascular Disease

## 2014-05-03 LAB — CBC WITH DIFFERENTIAL/PLATELET
BASOS ABS: 0.1 10*3/uL (ref 0.0–0.1)
BASOS PCT: 1.1 %
EOS PCT: 4.4 %
Eosinophil #: 0.2 10*3/uL (ref 0.0–0.7)
HCT: 35.2 % (ref 35.0–47.0)
HGB: 11.9 g/dL — ABNORMAL LOW (ref 12.0–16.0)
LYMPHS PCT: 46.8 %
Lymphocyte #: 2.1 10*3/uL (ref 1.0–3.6)
MCH: 28.7 pg (ref 26.0–34.0)
MCHC: 33.8 g/dL (ref 32.0–36.0)
MCV: 85 fL (ref 80–100)
MONOS PCT: 7.2 %
Monocyte #: 0.3 x10 3/mm (ref 0.2–0.9)
Neutrophil #: 1.8 10*3/uL (ref 1.4–6.5)
Neutrophil %: 40.5 %
PLATELETS: 211 10*3/uL (ref 150–440)
RBC: 4.16 10*6/uL (ref 3.80–5.20)
RDW: 17 % — ABNORMAL HIGH (ref 11.5–14.5)
WBC: 4.5 10*3/uL (ref 3.6–11.0)

## 2014-05-03 LAB — BASIC METABOLIC PANEL
Anion Gap: 8 (ref 7–16)
BUN: 12 mg/dL (ref 7–18)
CHLORIDE: 107 mmol/L (ref 98–107)
CO2: 28 mmol/L (ref 21–32)
Calcium, Total: 8.9 mg/dL (ref 8.5–10.1)
Creatinine: 1.02 mg/dL (ref 0.60–1.30)
EGFR (African American): >60
EGFR (Non-African Amer.): >60
GLUCOSE: 113 mg/dL — AB (ref 65–99)
Osmolality: 286 (ref 275–301)
Potassium: 3.5 mmol/L (ref 3.5–5.1)
Sodium: 143 mmol/L (ref 136–145)

## 2014-05-28 ENCOUNTER — Encounter: Payer: Self-pay | Admitting: Internal Medicine

## 2014-05-29 ENCOUNTER — Emergency Department: Payer: Self-pay | Admitting: Student

## 2014-05-29 LAB — URINALYSIS, COMPLETE
BLOOD: NEGATIVE
Bilirubin,UR: NEGATIVE
Glucose,UR: NEGATIVE mg/dL (ref 0–75)
KETONE: NEGATIVE
Leukocyte Esterase: NEGATIVE
NITRITE: NEGATIVE
Ph: 6 (ref 4.5–8.0)
Protein: NEGATIVE
Specific Gravity: 1.011 (ref 1.003–1.030)
Squamous Epithelial: 1
WBC UR: <1 /HPF (ref 0–5)

## 2014-05-29 LAB — COMPREHENSIVE METABOLIC PANEL
ALT: 18 U/L
Albumin: 3.7 g/dL (ref 3.4–5.0)
Alkaline Phosphatase: 66 U/L
Anion Gap: 6 — ABNORMAL LOW (ref 7–16)
BUN: 13 mg/dL (ref 7–18)
Bilirubin,Total: 0.5 mg/dL (ref 0.2–1.0)
CHLORIDE: 103 mmol/L (ref 98–107)
CO2: 31 mmol/L (ref 21–32)
Calcium, Total: 9.1 mg/dL (ref 8.5–10.1)
Creatinine: 0.95 mg/dL (ref 0.60–1.30)
GLUCOSE: 101 mg/dL — AB (ref 65–99)
Osmolality: 280 (ref 275–301)
Potassium: 3.5 mmol/L (ref 3.5–5.1)
SGOT(AST): 14 U/L — ABNORMAL LOW (ref 15–37)
SODIUM: 140 mmol/L (ref 136–145)
Total Protein: 7.3 g/dL (ref 6.4–8.2)

## 2014-05-29 LAB — CBC
HCT: 36.5 % (ref 35.0–47.0)
HGB: 11.6 g/dL — ABNORMAL LOW (ref 12.0–16.0)
MCH: 27.3 pg (ref 26.0–34.0)
MCHC: 31.8 g/dL — AB (ref 32.0–36.0)
MCV: 86 fL (ref 80–100)
Platelet: 211 10*3/uL (ref 150–440)
RBC: 4.25 10*6/uL (ref 3.80–5.20)
RDW: 16.7 % — ABNORMAL HIGH (ref 11.5–14.5)
WBC: 5.3 10*3/uL (ref 3.6–11.0)

## 2014-05-29 LAB — HCG, QUANTITATIVE, PREGNANCY: BETA HCG, QUANT.: 2 m[IU]/mL

## 2014-07-16 DIAGNOSIS — M479 Spondylosis, unspecified: Secondary | ICD-10-CM | POA: Insufficient documentation

## 2014-11-06 ENCOUNTER — Encounter: Payer: Self-pay | Admitting: Internal Medicine

## 2014-11-06 ENCOUNTER — Emergency Department: Payer: Self-pay | Admitting: Emergency Medicine

## 2014-12-28 NOTE — Op Note (Signed)
PATIENT NAME:  Lindsay Cooke, Lindsay Cooke MR#:  [id number] DATE OF BIRTH:  [birth year]  DATE OF PROCEDURE:  10/18/2013  PREOPERATIVE DIAGNOSIS:  Acute cholecystitis.   POSTOPERATIVE DIAGNOSIS:  Acute cholecystitis.   PROCEDURE:  Laparoscopic cholecystectomy.   SURGEON:  Richard E. Burt Knack, M.D.   ANESTHESIA:  General with endotracheal tube.   ASSISTANT:  Applebaum, PA-S.   INDICATIONS:  This is a patient with unrelenting right upper quadrant pain and a workup showing acute cholecystitis. Preoperatively, we discussed the rationale for surgery, the options of observation, the risk of bleeding, infection, recurrence of symptoms, failure to resolve her symptoms, bowel injury, bile duct injury, bile duct leak, retained common bile duct stone, any of which could require further surgery and/or ERCP, stent and papillotomy. She has also had a ventral hernia repair in the supraumbilical area, and because of her large scar and keloid, we discussed the risk of additional keloid, but more importantly, the risk of the inability to access the abdominal cavity without bowel injury and the potential for an open procedure or bowel repair. This was reviewed for her. She understood and agreed to proceed.   FINDINGS:  Acute cholecystitis with chronic cholecystitis and a very thickened, scarified gallbladder wall, necrosis posteriorly in the gallbladder fossa, multiple small gallstones, thick bile and extensive adhesions.   DESCRIPTION OF PROCEDURE:  The patient was induced to general anesthesia, she was on IV antibiotics. VTE prophylaxis was in place. She was prepped and draped in a sterile fashion. Marcaine was infiltrated in the skin and subcutaneous tissues in the supraumbilical area cephalad to the transverse keloid scar present and a midline incision was utilized to place a Visiport. A Visiport was utilized with a 5-mm, 0-degree scope to enter the abdominal cavity. Entrance was to the left of the falciform ligament, but  maneuvering around the falciform ligament revealed no additional adhesions in this area, and under direct vision, 2 lateral 5-mm ports were placed followed by a 12-mm epigastric port. The camera was placed in each of these other port sites to view back at the periumbilical site and it was clear that there were adhesions inferior to this, but no adhesions in the area of the Visiport trocar placement and no sign of bowel involved in any of those adhesions and certainly no sign of bowel injury.   With the camera in the periumbilical site, the gallbladder was identified encased in omentum. The omental adhesions were taken down bluntly. They were both acute and chronic in nature (scar tissue was present from the liver dome to the diaphragm as well). Dissection of this omental cake off of the gallbladder was performed revealing a very edematous and chronically and acutely inflamed gallbladder. The 0-degree scope was changed out to a 30-degree scope to allow visualization of the infundibular area and the gallbladder was placed on tension. An incision using utilizing blunt dissection and sharp dissection without the use of energy was performed in the area of the infundibulum to reveal a very thickened rind-like area around the infundibulum. This was taken down to reveal the cystic artery, which was doubly clipped and divided. This allowed for good visualization of the cystic duct as it entered into the infundibulum. The SAFE procedure was performed, and there was no sign of common hepatic duct behind the gallbladder signifying that the cystic duct had indeed been identified. Here it was triply clipped and divided. It was very thickened, but had a small lumen, and the cystic duct was divided. The gallbladder was taken  from the gallbladder fossa with blunt retraction and dissection. Some small vessels were identified and doubly clipped and divided as well, and the gallbladder was taken from the gallbladder fossa and passed  out through an enlarged epigastric port site with the aid of an Endo Catch bag. There was very little bleeding. The area was irrigated with copious amounts of normal saline. Spilled stones were retrieved. There was no sign of bile leak or bleeding and no sign of bowel injury. The clips were in good position and there was no sign of bile leak.   Through the epigastric port and out the right lateral port was placed a 10-mm JP drain placed into the foramen of Winslow and held in with 3-0 nylon. Again, the area was checked for hemostasis and found to be adequate.  The camera was placed in multiple port sites to view back at the periumbilical site. Again, there was no sign of bowel adhesions involved in this area and no sign of bowel injury. Therefore, with hemostasis being adequate, the pneumoperitoneum was released. All ports were removed. The drain was placed to bulb suction and the fascial edges at the epigastric site and periumbilical site were approximated with figure-of-eight 0 Vicryls and then 4-0 subcuticular Monocryl was used on all skin edges. Steri-Strips, Mastisol and sterile dressings were placed.     The patient tolerated the procedure well. There were no complications. She was taken to the recovery room in stable condition to be admitted for continued care.  ____________________________ Jerrol Banana. Burt Knack, MD rec:jm D: 10/18/2013 13:50:44 ET T: 10/18/2013 15:13:18 ET JOB#: [id number]  cc: Jerrol Banana. Burt Knack, MD, <Dictator> Florene Glen MD ELECTRONICALLY SIGNED 10/18/2013 15:38

## 2014-12-28 NOTE — Consult Note (Signed)
Brief Consult Note: Diagnosis: Sx cholelithiasis.   Patient was seen by consultant.   Discussed with Attending MD.   Comments: Nl WBC, temp, and nontender abdominal exam. Has been having Sx for > a year; this time not markedly different. Mom and sister s/p ccy. No indications of acute cholecystitis or hydrops. Should resolve with opiod analgesics and nausea meds. We will be happy to see as outpatient and schedule for lap ccy electively.  Electronic Signatures: Consuela Mimes (MD)  (Signed 04-Feb-15 09:35)  Authored: Brief Consult Note   Last Updated: 04-Feb-15 09:35 by Consuela Mimes (MD)

## 2014-12-28 NOTE — Discharge Summary (Signed)
PATIENT NAME:  Lindsay Cooke, WIERSMA MR#:  [id number] DATE OF BIRTH:  08-Feb-1961  DATE OF ADMISSION:  10/16/2013 DATE OF DISCHARGE:  10/21/2013  DIAGNOSES: Acute cholecystitis, obesity, diabetes, hypertension.   PROCEDURE: Laparoscopic cholecystectomy.   HISTORY OF PRESENT ILLNESS: This is a patient with unrelenting right upper quadrant pain with gallstones and a tentative diagnosis of acute cholecystitis. She was taken to the Operating Room where that diagnosis was confirmed. Laparoscopic cholecystectomy was performed without incident. A drain was placed, subsequently removed and never showed any sign of bile leak.  She is tolerating a regular diet and is restarted on her home medications with oral  analgesics. She will follow up in our office in 10 days with instructions to shower.   ____________________________ Jerrol Banana Burt Knack, MD rec:cs D: 10/21/2013 13:50:29 ET T: 10/21/2013 15:57:04 ET JOB#: [id number]  cc: Jerrol Banana. Burt Knack, MD, <Dictator> Florene Glen MD ELECTRONICALLY SIGNED 10/21/2013 18:53

## 2015-02-22 ENCOUNTER — Encounter: Payer: Self-pay | Admitting: Emergency Medicine

## 2015-02-22 ENCOUNTER — Emergency Department: Payer: Medicare Other

## 2015-02-22 ENCOUNTER — Emergency Department
Admission: EM | Admit: 2015-02-22 | Discharge: 2015-02-22 | Disposition: A | Discharge status: Home / Self Care | Payer: Medicare Other | Attending: Emergency Medicine | Admitting: Emergency Medicine

## 2015-02-22 DIAGNOSIS — J45901 Unspecified asthma with (acute) exacerbation: Secondary | ICD-10-CM

## 2015-02-22 DIAGNOSIS — Z880 Allergy status to penicillin: Secondary | ICD-10-CM | POA: Insufficient documentation

## 2015-02-22 DIAGNOSIS — I10 Essential (primary) hypertension: Secondary | ICD-10-CM | POA: Diagnosis not present

## 2015-02-22 DIAGNOSIS — R05 Cough: Secondary | ICD-10-CM | POA: Diagnosis present

## 2015-02-22 DIAGNOSIS — Z79899 Other long term (current) drug therapy: Secondary | ICD-10-CM | POA: Insufficient documentation

## 2015-02-22 MED ORDER — PREDNISONE 20 MG PO TABS
60.0000 mg | ORAL_TABLET | Freq: Once | ORAL | Status: AC
  Administered 2015-02-22: 60 mg via ORAL

## 2015-02-22 MED ORDER — IPRATROPIUM-ALBUTEROL 0.5-2.5 (3) MG/3ML IN SOLN
RESPIRATORY_TRACT | Status: AC
  Administered 2015-02-22: 3 mL via RESPIRATORY_TRACT
  Filled 2015-02-22: qty 3

## 2015-02-22 MED ORDER — IPRATROPIUM-ALBUTEROL 0.5-2.5 (3) MG/3ML IN SOLN
3.0000 mL | Freq: Once | RESPIRATORY_TRACT | Status: AC
  Administered 2015-02-22: 3 mL via RESPIRATORY_TRACT

## 2015-02-22 MED ORDER — PREDNISONE 10 MG PO TABS: ORAL_TABLET | ORAL | Status: DC

## 2015-02-22 MED ORDER — ALBUTEROL SULFATE HFA 108 (90 BASE) MCG/ACT IN AERS: 2.0000 | INHALATION_SPRAY | Freq: Four times a day (QID) | RESPIRATORY_TRACT | Status: DC | PRN

## 2015-02-22 MED ORDER — PREDNISONE 20 MG PO TABS
ORAL_TABLET | ORAL | Status: AC
  Administered 2015-02-22: 60 mg via ORAL
  Filled 2015-02-22: qty 3

## 2015-02-22 NOTE — Discharge Instructions (Signed)

## 2015-02-22 NOTE — ED Provider Notes (Signed)
[facility] Emergency Department Provider Note  ____________________________________________  Time seen:  12:22 PM  I have reviewed the triage vital signs and the nursing notes.   HISTORY  Chief Complaint Cough   HPI Lindsay Cooke is a 54 y.o. female is here today with complaint of productive cough and asthma problems. She has not been on any inhalers for many years. She is unsure if she is having a fever during this time. She states she felt short of breath on exertion. She states her pain is a 9 out of 10. She states that cough and asthma was worse today but has been going on for several days.   Past Medical History  Diagnosis Date  . Reflux   . Hypertension     Patient Active Problem List   Diagnosis Date Noted  . FATIGUE 10/03/2008  . SNORING 10/03/2008  . GUAIAC POSITIVE STOOL 05/07/2008  . GASTROENTERITIS 02/23/2008  . DEPRESSION 11/27/2007  . MEDIAL MENISCUS TEAR, RIGHT 11/20/2007  . ANEMIA 11/15/2007  . OSTEOARTHRITIS, KNEE 11/15/2007  . GERD 09/06/2007  . OBESITY 07/21/2007  . HYPERTENSION 07/21/2007  . ASTHMA 07/21/2007  . KELOID 07/21/2007    Past Surgical History  Procedure Laterality Date  . Cholecystectomy    . Keloid excision    . Hernia repair      Current Outpatient Rx  Name  Route  Sig  Dispense  Refill  . albuterol (PROVENTIL HFA;VENTOLIN HFA) 108 (90 BASE) MCG/ACT inhaler   Inhalation   Inhale 2 puffs into the lungs every 6 (six) hours as needed for wheezing or shortness of breath.   1 Inhaler   2   . losartan-hydrochlorothiazide (HYZAAR) 50-12.5 MG per tablet   Oral   Take 1 tablet by mouth daily.         . meloxicam (MOBIC) 15 MG tablet   Oral   Take 1 tablet (15 mg total) by mouth daily.   30 tablet   1   . predniSONE (DELTASONE) 10 MG tablet      Take 5 tablets tomorrow, on day 2 take 4 tablets, day 3 take 3 tablets, day 4 take 2 tablets, day 5 take  1 tablets the last day   21 tablet   0      Allergies Penicillins  No family history on file.  Social History History  Substance Use Topics  . Smoking status: Never Smoker   . Smokeless tobacco: Not on file  . Alcohol Use: No    Review of Systems Constitutional: Unsure about fever/chills Eyes: No visual changes. ENT: No sore throat. Cardiovascular: Denies chest pain. Respiratory: Positive shortness of breath on exertion especially outside. Productive cough. Gastrointestinal: No abdominal pain.  No nausea, no vomiting.  Genitourinary: Negative for dysuria. Musculoskeletal: Negative for back pain. Skin: Negative for rash. Neurological: Negative for headaches, focal weakness or numbness.  10-point ROS otherwise negative.  ____________________________________________   PHYSICAL EXAM:  VITAL SIGNS: ED Triage Vitals  Enc Vitals Group     BP 02/22/15 1200 111/91 mmHg     Pulse Rate 02/22/15 1200 90     Resp 02/22/15 1200 18     Temp 02/22/15 1200 98.6 F (37 C)     Temp Source 02/22/15 1200 Oral     SpO2 02/22/15 1200 98 %     Weight 02/22/15 1200 240 lb (108.863 kg)     Height 02/22/15 1200 5\' 4"  (1.626 m)     Head Cir --  Peak Flow --      Pain Score 02/22/15 1202 9     Pain Loc --      Pain Edu? --      Excl. in [place]? --     Constitutional: Alert and oriented. Well appearing and in no acute distress. Eyes: Conjunctivae are normal. PERRL. EOMI. Head: Atraumatic. Nose: No congestion/rhinnorhea. Mouth/Throat: Mucous membranes are moist.  Oropharynx non-erythematous. Neck: No stridor.  Supple Hematological/Lymphatic/Immunilogical: No cervical lymphadenopathy. Cardiovascular: Normal rate, regular rhythm. Grossly normal heart sounds.  Good peripheral circulation. Respiratory: Normal respiratory effort.  No retractions. Lungs bilateral expiratory wheezes noted. Patient is able to speak in full sentences without any difficulty. Gastrointestinal: Soft and nontender. No distention. No abdominal bruits. No  CVA tenderness. Musculoskeletal: No lower extremity tenderness nor edema.  No joint effusions. Neurologic:  Normal speech and language. No gross focal neurologic deficits are appreciated. Speech is normal. No gait instability. Skin:  Skin is warm, dry and intact. No rash noted. Psychiatric: Mood and affect are normal. Speech and behavior are normal.  ____________________________________________   LABS (all labs ordered are listed, but only abnormal results are displayed)  Labs Reviewed - No data to display RADIOLOGY  Chest x-ray per radiologist showed no active cardiopulmonary disease. ____________________________________________   PROCEDURES  Procedure(s) performed: None  Critical Care performed: No  ____________________________________________   INITIAL IMPRESSION / ASSESSMENT AND PLAN / ED COURSE  Pertinent labs & imaging results that were available during my care of the patient were reviewed by me and considered in my medical decision making (see chart for details).  Patient received DuoNeb treatment while in the emergency room along with prednisone. She states she is markedly improved after the DuoNeb treatment. She was given a prescription for albuterol inhaler along with another 5 days of prednisone to taper. She is to follow-up with her doctor if any continued problems. ____________________________________________   FINAL CLINICAL IMPRESSION(S) / ED DIAGNOSES  Final diagnoses:  Asthma exacerbation      Johnn Hai, PA-C 02/22/15 1547  Earleen Newport, MD 02/23/15 1810

## 2015-02-22 NOTE — ED Notes (Signed)
Speaking full sentences

## 2015-04-10 ENCOUNTER — Other Ambulatory Visit: Payer: Self-pay | Admitting: Nurse Practitioner

## 2015-04-10 ENCOUNTER — Other Ambulatory Visit: Payer: Self-pay | Admitting: Pediatrics

## 2015-04-10 DIAGNOSIS — Z1231 Encounter for screening mammogram for malignant neoplasm of breast: Secondary | ICD-10-CM

## 2015-04-21 ENCOUNTER — Encounter: Payer: Self-pay | Admitting: Emergency Medicine

## 2015-04-21 ENCOUNTER — Emergency Department
Admission: EM | Admit: 2015-04-21 | Discharge: 2015-04-21 | Disposition: A | Discharge status: Home / Self Care | Payer: Medicare Other | Attending: Emergency Medicine | Admitting: Emergency Medicine

## 2015-04-21 DIAGNOSIS — J45909 Unspecified asthma, uncomplicated: Secondary | ICD-10-CM | POA: Diagnosis not present

## 2015-04-21 DIAGNOSIS — Z79899 Other long term (current) drug therapy: Secondary | ICD-10-CM | POA: Diagnosis not present

## 2015-04-21 DIAGNOSIS — Z791 Long term (current) use of non-steroidal anti-inflammatories (NSAID): Secondary | ICD-10-CM | POA: Insufficient documentation

## 2015-04-21 DIAGNOSIS — Z7952 Long term (current) use of systemic steroids: Secondary | ICD-10-CM | POA: Insufficient documentation

## 2015-04-21 DIAGNOSIS — L299 Pruritus, unspecified: Secondary | ICD-10-CM | POA: Diagnosis present

## 2015-04-21 DIAGNOSIS — Z880 Allergy status to penicillin: Secondary | ICD-10-CM | POA: Diagnosis not present

## 2015-04-21 DIAGNOSIS — I10 Essential (primary) hypertension: Secondary | ICD-10-CM | POA: Diagnosis not present

## 2015-04-21 MED ORDER — PERMETHRIN 5 % EX CREA: 1.0000 "application " | TOPICAL_CREAM | Freq: Once | CUTANEOUS | Status: DC

## 2015-04-21 MED ORDER — HYDROXYZINE HCL 50 MG PO TABS: 50.0000 mg | ORAL_TABLET | Freq: Three times a day (TID) | ORAL | Status: DC | PRN

## 2015-04-21 NOTE — ED Notes (Signed)
Says itching all over x 1 week.

## 2015-04-21 NOTE — ED Notes (Signed)
Assess per PA 

## 2015-04-21 NOTE — ED Provider Notes (Signed)
[facility] Emergency Department Provider Note  ____________________________________________  Time seen: Approximately 10:08 AM  I have reviewed the triage vital signs and the nursing notes.   HISTORY  Chief Complaint Pruritis    HPI Lindsay Cooke is a 54 y.o. female complaining of itching for one week. Patient denies any new foods hygiene and lunch products. Patient state she is all over her body and and intensifies at night. Patient states no lesions noted. Patient states she is standing at a house not slept anywhere  Past Medical History  Diagnosis Date  . Reflux   . Hypertension     Patient Active Problem List   Diagnosis Date Noted  . FATIGUE 10/03/2008  . SNORING 10/03/2008  . GUAIAC POSITIVE STOOL 05/07/2008  . GASTROENTERITIS 02/23/2008  . DEPRESSION 11/27/2007  . MEDIAL MENISCUS TEAR, RIGHT 11/20/2007  . ANEMIA 11/15/2007  . OSTEOARTHRITIS, KNEE 11/15/2007  . GERD 09/06/2007  . OBESITY 07/21/2007  . HYPERTENSION 07/21/2007  . ASTHMA 07/21/2007  . KELOID 07/21/2007    Past Surgical History  Procedure Laterality Date  . Cholecystectomy    . Keloid excision    . Hernia repair      Current Outpatient Rx  Name  Route  Sig  Dispense  Refill  . albuterol (PROVENTIL HFA;VENTOLIN HFA) 108 (90 BASE) MCG/ACT inhaler   Inhalation   Inhale 2 puffs into the lungs every 6 (six) hours as needed for wheezing or shortness of breath.   1 Inhaler   2   . losartan-hydrochlorothiazide (HYZAAR) 50-12.5 MG per tablet   Oral   Take 1 tablet by mouth daily.         . meloxicam (MOBIC) 15 MG tablet   Oral   Take 1 tablet (15 mg total) by mouth daily.   30 tablet   1   . predniSONE (DELTASONE) 10 MG tablet      Take 5 tablets tomorrow, on day 2 take 4 tablets, day 3 take 3 tablets, day 4 take 2 tablets, day 5 take  1 tablets the last day   21 tablet   0     Allergies Penicillins  No family history on file.  Social History Social  History  Substance Use Topics  . Smoking status: Never Smoker   . Smokeless tobacco: None  . Alcohol Use: No    Review of Systems Constitutional: No fever/chills Eyes: No visual changes. ENT: No sore throat. Cardiovascular: Denies chest pain. Respiratory: Denies shortness of breath. Gastrointestinal: No abdominal pain.  No nausea, no vomiting.  No diarrhea.  No constipation. Genitourinary: Negative for dysuria. Musculoskeletal: Negative for back pain. Skin: Negative for rash. Constant itching Neurological: Negative for headaches, focal weakness or numbness. Endocrine:Hypertension and asthma Hematological/Lymphatic: Allergic/Immunilogical: Penicillin 10-point ROS otherwise negative.  ____________________________________________   PHYSICAL EXAM:  VITAL SIGNS: ED Triage Vitals  Enc Vitals Group     BP 04/21/15 0955 145/70 mmHg     Pulse Rate 04/21/15 0955 86     Resp 04/21/15 0955 20     Temp 04/21/15 0955 97.8 F (36.6 C)     Temp Source 04/21/15 0955 Oral     SpO2 04/21/15 0955 98 %     Weight 04/21/15 0955 240 lb (108.863 kg)     Height 04/21/15 0955 5\' 1"  (1.549 m)     Head Cir --      Peak Flow --      Pain Score --      Pain Loc --  Pain Edu? --      Excl. in [place]? --     Constitutional: Alert and oriented. Well appearing and in no acute distress. Eyes: Conjunctivae are normal. PERRL. EOMI. Head: Atraumatic. Nose: No congestion/rhinnorhea. Mouth/Throat: Mucous membranes are moist.  Oropharynx non-erythematous. Neck: No stridor. No cervical spine tenderness to palpation. Cardiovascular: Normal rate, regular rhythm. Grossly normal heart sounds.  Good peripheral circulation. Respiratory: Normal respiratory effort.  No retractions. Lungs CTAB. Gastrointestinal: Soft and nontender. No distention. No abdominal bruits. No CVA tenderness. Genitourinary:  Musculoskeletal: No lower extremity tenderness nor edema.  No joint effusions. Neurologic:  Normal speech  and language. No gross focal neurologic deficits are appreciated. No gait instability. Skin:  Skin is warm, dry and intact. No rash noted. Signs of excoriation but no lesions Psychiatric: Mood and affect are normal. Speech and behavior are normal.  ____________________________________________   LABS (all labs ordered are listed, but only abnormal results are displayed)  Labs Reviewed - No data to display ____________________________________________  EKG   ____________________________________________  RADIOLOGY   ____________________________________________   PROCEDURES  Procedure(s) performed: None  Critical Care performed: No  ____________________________________________   INITIAL IMPRESSION / ASSESSMENT AND PLAN / ED COURSE  Pertinent labs & imaging results that were available during my care of the patient were reviewed by me and considered in my medical decision making (see chart for details).  Pruritus. Patient given a prescription for Atarax for itching. Although no visible lesions for history of nocturnal intensified itching also prescribed Elimite. Patient advised follow-up family doctor in 2-3 days improvement.   FINAL CLINICAL IMPRESSION(S) / ED DIAGNOSES  Final diagnoses:  Pruritus      Sable Feil, PA-C 04/21/15 [place], PA-C 04/21/15 1022  Earleen Newport, MD 04/23/15 [phone]

## 2015-04-23 ENCOUNTER — Encounter: Payer: Self-pay | Admitting: Internal Medicine

## 2015-05-06 ENCOUNTER — Ambulatory Visit: Payer: Medicare Other

## 2015-05-13 ENCOUNTER — Ambulatory Visit: Admission: RE | Admit: 2015-05-13 | Payer: Medicare Other | Source: Ambulatory Visit

## 2015-05-28 DIAGNOSIS — I82409 Acute embolism and thrombosis of unspecified deep veins of unspecified lower extremity: Secondary | ICD-10-CM | POA: Insufficient documentation

## 2015-05-29 ENCOUNTER — Other Ambulatory Visit: Payer: Self-pay | Admitting: Nurse Practitioner

## 2015-05-29 ENCOUNTER — Ambulatory Visit
Admission: RE | Admit: 2015-05-29 | Discharge: 2015-05-29 | Disposition: A | Discharge status: Home / Self Care | Payer: Medicare Other | Source: Ambulatory Visit | Attending: Nurse Practitioner | Admitting: Nurse Practitioner

## 2015-05-29 DIAGNOSIS — Z1231 Encounter for screening mammogram for malignant neoplasm of breast: Secondary | ICD-10-CM

## 2015-06-10 ENCOUNTER — Other Ambulatory Visit: Payer: Self-pay | Admitting: *Deleted

## 2015-06-10 ENCOUNTER — Encounter: Payer: Self-pay | Admitting: *Deleted

## 2015-06-11 ENCOUNTER — Encounter (INDEPENDENT_AMBULATORY_CARE_PROVIDER_SITE_OTHER): Payer: Self-pay

## 2015-06-11 ENCOUNTER — Encounter: Payer: Self-pay | Admitting: Internal Medicine

## 2015-06-11 ENCOUNTER — Inpatient Hospital Stay: Payer: Medicare Other

## 2015-06-11 ENCOUNTER — Inpatient Hospital Stay: Payer: Medicare Other | Attending: Internal Medicine | Admitting: Internal Medicine

## 2015-06-11 VITALS — BP 121/81 | HR 75 | Temp 97.8°F | Resp 20 | Ht 61.0 in | Wt 242.5 lb

## 2015-06-11 DIAGNOSIS — M79651 Pain in right thigh: Secondary | ICD-10-CM | POA: Diagnosis not present

## 2015-06-11 DIAGNOSIS — D649 Anemia, unspecified: Secondary | ICD-10-CM | POA: Diagnosis not present

## 2015-06-11 DIAGNOSIS — K219 Gastro-esophageal reflux disease without esophagitis: Secondary | ICD-10-CM

## 2015-06-11 DIAGNOSIS — M479 Spondylosis, unspecified: Secondary | ICD-10-CM

## 2015-06-11 DIAGNOSIS — I10 Essential (primary) hypertension: Secondary | ICD-10-CM | POA: Diagnosis not present

## 2015-06-11 DIAGNOSIS — I82411 Acute embolism and thrombosis of right femoral vein: Secondary | ICD-10-CM | POA: Diagnosis present

## 2015-06-11 DIAGNOSIS — M25561 Pain in right knee: Secondary | ICD-10-CM | POA: Diagnosis not present

## 2015-06-11 DIAGNOSIS — Z7901 Long term (current) use of anticoagulants: Secondary | ICD-10-CM | POA: Diagnosis not present

## 2015-06-11 DIAGNOSIS — E669 Obesity, unspecified: Secondary | ICD-10-CM | POA: Diagnosis not present

## 2015-06-11 NOTE — Progress Notes (Signed)
[name] CONSULT NOTE  No care team member to display  CHIEF COMPLAINTS/PURPOSE OF CONSULTATION:  DVT recurrent  HEMATOLOGIC HISTORY:  # ? MARCH DVT on Eliquis ; SEP 2016-DVT [RIGHT sup Fem vein] on Eliquis   HISTORY OF PRESENTING ILLNESS:  Lindsay Cooke 54 y.o. morbidly obese female with recently diagnosed right lower extremity DVT has been referred to [place] for further evaluation.  Patient states that spring of 2016 she had a blood clot in the right lower extremity; she denies any long flights or surgeries or any other condition leading to immobility around the time of the blood clot. She states to be on anticoagulation for 3 months or so; and then stop.  However again few Weeks ago patient noted to have worsening pain in her right thigh; and along with worsening right knee pain [this is chronic]. A repeat ultrasound in the doctor's office showed DVT of the right superficial femoral vein. Patient has been started back on Eliquis.   Patient states that there is no significant improvement in the right thigh pain or swelling. However more importantly she defers to her right knee pain as more significant than the right thigh pain.   ROS: A complete 10 point review of system is done which is negative except mentioned above in history of present illness  MEDICAL HISTORY:  Past Medical History  Diagnosis Date  . Reflux   . Benign essential hypertension   . Right leg DVT ([place])   . Obesity   . Lumbar spondylosis   . Anemia     SURGICAL HISTORY: Past Surgical History  Procedure Laterality Date  . Cholecystectomy    . Keloid excision    . Hernia repair      SOCIAL HISTORY: Social History   Social History  . Marital Status: Single    Spouse Name: N/A  . Number of Children: N/A  . Years of Education: N/A   Occupational History  . Not on file.   Social History Main Topics  . Smoking status: Never Smoker   . Smokeless tobacco: Never Used  . Alcohol Use: No   . Drug Use: No  . Sexual Activity: No   Other Topics Concern  . Not on file   Social History Narrative    FAMILY HISTORY: Family History  Problem Relation Age of Onset  . Breast cancer Sister   . Heart disease Mother   . Congestive Heart Failure Sister     ALLERGIES:  is allergic to penicillins.  MEDICATIONS:  Current Outpatient Prescriptions  Medication Sig Dispense Refill  . albuterol (PROAIR HFA) 108 (90 BASE) MCG/ACT inhaler Inhale 1 Inhaler into the lungs daily as needed. Shortness of breath    . albuterol (PROVENTIL HFA;VENTOLIN HFA) 108 (90 BASE) MCG/ACT inhaler Inhale 2 puffs into the lungs every 6 (six) hours as needed for wheezing or shortness of breath. 1 Inhaler 2  . apixaban (ELIQUIS) 5 MG TABS tablet Take 5 mg by mouth 2 (two) times daily.    Marland Kitchen Apple Cider Vinegar (APPLE CIDER VINEGAR ULTRA) 600 MG CAPS Take 1 capsule by mouth daily.    . Biotin 5000 MCG CAPS Take 1 capsule by mouth daily.    . Cetirizine HCl 10 MG CAPS Take 10 mg by mouth daily.    . Cholecalciferol (D3-1000 PO) Take 1 tablet by mouth daily.    . Cinnamon 500 MG capsule Take 1 capsule by mouth daily.    . DULoxetine (CYMBALTA) 60 MG capsule  Take 60 mg by mouth 2 (two) times daily.    Water engineer Bandages & Supports (MEDICAL COMPRESSION THIGH HIGH) MISC by Does not apply route.    Marland Kitchen FLUARIX QUADRIVALENT 0.5 ML injection inject 0.5 milliliter intramuscularly  0  . hydrALAZINE (APRESOLINE) 25 MG tablet Take 25 mg by mouth daily.    Marland Kitchen losartan-hydrochlorothiazide (HYZAAR) 100-25 MG tablet Take 1 tablet by mouth daily.    Marland Kitchen omeprazole (PRILOSEC) 40 MG capsule Take 1 capsule by mouth daily.    . Prenatal Vit-Fe Fumarate-FA (MULTIVITAMIN-PRENATAL) 27-0.8 MG TABS tablet Take 1 tablet by mouth daily at 12 noon.    . vitamin C (ASCORBIC ACID) 500 MG tablet Take 500 mg by mouth daily.    . vitamin E (VITAMIN E) 400 UNIT capsule Take 400 Units by mouth daily.     No current facility-administered medications  for this visit.      Marland Kitchen  PHYSICAL EXAMINATION: ECOG PERFORMANCE STATUS: 1 - Symptomatic but completely ambulatory  Filed Vitals:   06/11/15 1500  BP: 121/81  Pulse: 75  Temp: 97.8 F (36.6 C)  Resp: 20   Filed Weights   06/11/15 1500  Weight: 242 lb 8.1 oz (110 kg)    GENERAL: Well-nourished well-developed; Alert, no distress; mild discomfort because of knee pain. Morbidly obese; has difficulty getting onto the table because of habitus. EYES: no pallor or icterus OROPHARYNX: no thrush or ulceration; good dentition  NECK: supple, no masses felt LYMPH:  no palpable lymphadenopathy in the cervical, axillary or inguinal regions LUNGS: clear to auscultation and  No wheeze or crackles HEART/CVS: regular rate & rhythm and no murmurs; Right thigh- seems to be swollen compared to the left. tenderness noted on palpation of the right thigh.  ABDOMEN: abdomen soft, non-tender and normal bowel sounds Musculoskeletal:no cyanosis of digits and no clubbing  PSYCH: alert & oriented x 3 with fluent speech NEURO: no focal motor/sensory deficits SKIN:  no rashes; keloid noted on the right side of the face.  LABORATORY DATA:  I have reviewed the data as listed Lab Results  Component Value Date   WBC 5.3 05/29/2014   HGB 11.6* 05/29/2014   HCT 36.5 05/29/2014   MCV 86 05/29/2014   PLT 211 05/29/2014   No results for input(s): NA, K, CL, CO2, GLUCOSE, BUN, CREATININE, CALCIUM, GFRNONAA, GFRAA, PROT, ALBUMIN, AST, ALT, ALKPHOS, BILITOT, BILIDIR, IBILI in the last 8760 hours.  RADIOGRAPHIC STUDIES: I have personally reviewed the radiological images as listed and agreed with the findings in the report. Mm Digital Screening Bilateral  05/29/2015   CLINICAL DATA:  Screening.  EXAM: DIGITAL SCREENING BILATERAL MAMMOGRAM WITH CAD  COMPARISON:  Previous exam(s).  ACR Breast Density Category b: There are scattered areas of fibroglandular density.  FINDINGS: There are no findings suspicious for  malignancy. Images were processed with CAD.  IMPRESSION: No mammographic evidence of malignancy. A result letter of this screening mammogram will be mailed directly to the patient.  RECOMMENDATION: Screening mammogram in one year. (Code:SM-B-01Y)  BI-RADS CATEGORY  1: Negative.   Electronically Signed   By: Fidela Salisbury M.D.   On: 05/29/2015 12:46    ASSESSMENT & PLAN:   # Right lower extremity DVT 2; and most recently September 2016 currently on Eliquis. The etiology of DVTs is unclear; however obesity  Is a risk factor. I would recommend checking factor V Leiden; prothrombin gene mutation; also anticardiolipin antibody workup. Patient recent cancer screening mammograms/colonoscopy negative.  For now I recommend continued  Eliquis.   # Discussed with the patient that the length of anticoagulation is unclear at this time. However, further recommendations to based upon above workup; patient's tolerance to anticoagulation etc. Our office has contacted PCPs office to get the results of the ultrasounds.  Thank you Ms. Matthew Saras for allowing me to participate in the care of your pleasant patient. Please do not hesitate to contact me with questions or concerns in the interim.  Questions were answered. The patient knows to call the clinic with any problems, questions or concerns.   I spent 20 minutes counseling the patient face to face. The total time spent in the appointment was 30 minutes and more than 50% was on counseling.     Cammie Sickle, MD 06/11/2015 4:05 PM

## 2015-06-13 ENCOUNTER — Telehealth: Payer: Self-pay | Admitting: *Deleted

## 2015-06-13 ENCOUNTER — Ambulatory Visit: Payer: Medicare Other | Admitting: Internal Medicine

## 2015-06-13 NOTE — Telephone Encounter (Signed)
Patient called. Questions were answered. Dr. Rogue Bussing states that patient might benefit from the stockings; however, the patient states that she can not afford these stocking (as the cost is $184/per leg). Patient wants to get these stockings over the counter cheaper. At this time, she can check her local pharmacies to see if there is a cheaper alternative. However, the patient still states that she will not be able to do buy the stockings that her primary care suggested.  Reviewed with patient that her coagulation workup has not just resulted.  I explained to patient that these results take over a week to result. We will have these results back in time for her next appointment on 10/19 with Dr. Rogue Bussing.

## 2015-06-13 NOTE — Telephone Encounter (Signed)
Pt called because she is suppose to wear thigh high stockings and the one she is suppose to buy is very costly.  She wants to know if there is cheaper alternatives.  Then she called back later about wanted to know about her blood report.  I told her I would send it to the nurse with Dr. B and someone would get back with her.

## 2015-06-16 LAB — FACTOR 5 LEIDEN

## 2015-06-16 LAB — CARDIOLIPIN ANTIBODIES, IGG, IGM, IGA: Anticardiolipin IgG: <9 GPL U/mL (ref 0–14)

## 2015-06-16 LAB — PROTHROMBIN GENE MUTATION

## 2015-06-17 ENCOUNTER — Ambulatory Visit: Payer: Medicare Other | Admitting: Internal Medicine

## 2015-06-25 ENCOUNTER — Encounter: Payer: Self-pay | Admitting: Internal Medicine

## 2015-06-25 ENCOUNTER — Inpatient Hospital Stay (HOSPITAL_BASED_OUTPATIENT_CLINIC_OR_DEPARTMENT_OTHER): Payer: Medicare Other | Admitting: Internal Medicine

## 2015-06-25 VITALS — BP 128/82 | HR 75 | Temp 96.9°F | Resp 18 | Ht 61.0 in | Wt 280.6 lb

## 2015-06-25 DIAGNOSIS — M25561 Pain in right knee: Secondary | ICD-10-CM | POA: Diagnosis not present

## 2015-06-25 DIAGNOSIS — I82411 Acute embolism and thrombosis of right femoral vein: Secondary | ICD-10-CM

## 2015-06-25 DIAGNOSIS — M79651 Pain in right thigh: Secondary | ICD-10-CM

## 2015-06-25 DIAGNOSIS — M479 Spondylosis, unspecified: Secondary | ICD-10-CM

## 2015-06-25 DIAGNOSIS — K219 Gastro-esophageal reflux disease without esophagitis: Secondary | ICD-10-CM

## 2015-06-25 DIAGNOSIS — I10 Essential (primary) hypertension: Secondary | ICD-10-CM

## 2015-06-25 DIAGNOSIS — E669 Obesity, unspecified: Secondary | ICD-10-CM

## 2015-06-25 DIAGNOSIS — I82401 Acute embolism and thrombosis of unspecified deep veins of right lower extremity: Secondary | ICD-10-CM

## 2015-06-25 DIAGNOSIS — Z7901 Long term (current) use of anticoagulants: Secondary | ICD-10-CM

## 2015-06-25 DIAGNOSIS — D649 Anemia, unspecified: Secondary | ICD-10-CM

## 2015-06-25 NOTE — Progress Notes (Signed)
[facility] CONSULT NOTE  No care team member to display  CHIEF COMPLAINTS/PURPOSE OF CONSULTATION:  DVT recurrent  HEMATOLOGIC HISTORY:  # ? MARCH DVT on Eliquis ; SEP 2016-DVT [RIGHT sup Fem vein] on Eliquis   HISTORY OF PRESENTING ILLNESS:  Lindsay Cooke 54 y.o. morbidly obese female with recently diagnosed right lower extremity DVT 2 most recently September 2016 that showed a right lower extremity DVT currently on Eliquis.  Patient continues to have right knee pain; right leg swelling more than left. She seems to be tolerating Eliquis fairly well without any bleeding issues.  She is here to review the results of her blood work/hypercoagulable workup.  MEDICAL HISTORY:  Past Medical History  Diagnosis Date  . Reflux   . Benign essential hypertension   . Right leg DVT ([place])   . Obesity   . Lumbar spondylosis   . Anemia     SURGICAL HISTORY: Past Surgical History  Procedure Laterality Date  . Cholecystectomy    . Keloid excision    . Hernia repair      SOCIAL HISTORY: Social History   Social History  . Marital Status: Single    Spouse Name: N/A  . Number of Children: N/A  . Years of Education: N/A   Occupational History  . Not on file.   Social History Main Topics  . Smoking status: Never Smoker   . Smokeless tobacco: Never Used  . Alcohol Use: No  . Drug Use: No  . Sexual Activity: No   Other Topics Concern  . Not on file   Social History Narrative    FAMILY HISTORY: Family History  Problem Relation Age of Onset  . Breast cancer Sister   . Heart disease Mother   . Congestive Heart Failure Sister     ALLERGIES:  is allergic to penicillins.  MEDICATIONS:  Current Outpatient Prescriptions  Medication Sig Dispense Refill  . albuterol (PROAIR HFA) 108 (90 BASE) MCG/ACT inhaler Inhale 1 Inhaler into the lungs daily as needed. Shortness of breath    . albuterol (PROVENTIL HFA;VENTOLIN HFA) 108 (90 BASE) MCG/ACT inhaler Inhale 2  puffs into the lungs every 6 (six) hours as needed for wheezing or shortness of breath. 1 Inhaler 2  . apixaban (ELIQUIS) 5 MG TABS tablet Take 5 mg by mouth 2 (two) times daily.    Marland Kitchen Apple Cider Vinegar (APPLE CIDER VINEGAR ULTRA) 600 MG CAPS Take 1 capsule by mouth daily.    . Biotin 5000 MCG CAPS Take 1 capsule by mouth daily.    . Cetirizine HCl 10 MG CAPS Take 10 mg by mouth daily.    . Cholecalciferol (D3-1000 PO) Take 1 tablet by mouth daily.    . Cinnamon 500 MG capsule Take 1 capsule by mouth daily.    Marland Kitchen FLUARIX QUADRIVALENT 0.5 ML injection inject 0.5 milliliter intramuscularly  0  . hydrALAZINE (APRESOLINE) 25 MG tablet Take 25 mg by mouth daily.    Marland Kitchen losartan-hydrochlorothiazide (HYZAAR) 100-25 MG tablet Take 1 tablet by mouth daily.    Marland Kitchen omeprazole (PRILOSEC) 40 MG capsule Take 1 capsule by mouth daily.    . Prenatal Vit-Fe Fumarate-FA (MULTIVITAMIN-PRENATAL) 27-0.8 MG TABS tablet Take 1 tablet by mouth daily at 12 noon.    . vitamin C (ASCORBIC ACID) 500 MG tablet Take 500 mg by mouth daily.    . vitamin E (VITAMIN E) 400 UNIT capsule Take 400 Units by mouth daily.    . DULoxetine (CYMBALTA) 60 MG  capsule Take 60 mg by mouth 2 (two) times daily.    Water engineer Bandages & Supports (MEDICAL COMPRESSION THIGH HIGH) MISC by Does not apply route.     No current facility-administered medications for this visit.      Marland Kitchen  PHYSICAL EXAMINATION: ECOG PERFORMANCE STATUS: 1 - Symptomatic but completely ambulatory  Filed Vitals:   06/25/15 0914  BP: 128/82  Pulse: 75  Temp: 96.9 F (36.1 C)  Resp: 18   Filed Weights   06/25/15 0914  Weight: 280 lb 10.3 oz (127.3 kg)    GENERAL: Well-nourished well-developed; Alert, no distress; mild discomfort because of knee pain. Morbidly obese; has difficulty getting onto the table because of habitus.  LABORATORY DATA:  I have reviewed the data as listed Lab Results  Component Value Date   WBC 5.3 05/29/2014   HGB 11.6* 05/29/2014    HCT 36.5 05/29/2014   MCV 86 05/29/2014   PLT 211 05/29/2014   No results for input(s): NA, K, CL, CO2, GLUCOSE, BUN, CREATININE, CALCIUM, GFRNONAA, GFRAA, PROT, ALBUMIN, AST, ALT, ALKPHOS, BILITOT, BILIDIR, IBILI in the last 8760 hours.  RADIOGRAPHIC STUDIES: I have personally reviewed the radiological images as listed and agreed with the findings in the report. Mm Digital Screening Bilateral  05/29/2015  CLINICAL DATA:  Screening. EXAM: DIGITAL SCREENING BILATERAL MAMMOGRAM WITH CAD COMPARISON:  Previous exam(s). ACR Breast Density Category b: There are scattered areas of fibroglandular density. FINDINGS: There are no findings suspicious for malignancy. Images were processed with CAD. IMPRESSION: No mammographic evidence of malignancy. A result letter of this screening mammogram will be mailed directly to the patient. RECOMMENDATION: Screening mammogram in one year. (Code:SM-B-01Y) BI-RADS CATEGORY  1: Negative. Electronically Signed   By: Fidela Salisbury M.D.   On: 05/29/2015 12:46    ASSESSMENT & PLAN:   # Right lower extremity DVT 2; and most recently September 2016 currently on Eliquis. Prothrombin gene mutation; factor V Leiden negative. Anticardiolipin antibody negative. Etiology unclear-but obesity/lack of significant morbidity is a likely factor. For now I recommended at least 4- 6 months of anticoagulation.  I recommend repeating ultrasound of the right lower extremity in approximately 4 months;  and then recommend final anticoagulation duration.   # Right knee pain- likely from arthritis rather than DVT. Recommend follow up with PCP.   I spent 15  minutes counseling the patient face to face. The total time spent in the appointment was 30 minutes and more than 50% was on counseling.     Cammie Sickle, MD 06/25/2015 9:47 AM

## 2015-06-25 NOTE — Progress Notes (Signed)
Pt here to talk with md about lab results to determine how long pt will need to take blood thinners, she states that her right thigh to knee hurts all the time and she takes tylenol and it is not working.

## 2015-07-01 ENCOUNTER — Telehealth: Payer: Self-pay | Admitting: *Deleted

## 2015-07-01 NOTE — Telephone Encounter (Signed)
Called to request assistance in getting eliquis States her insurance is denying it and she needs Dr B to do something

## 2015-07-09 NOTE — Telephone Encounter (Addendum)
I spoke with Judeen Hammans. She has left multiple message with patient's primary care provider and nursing team at [facility]. Her PMD is the provider that prescribed this drug. Judeen Hammans also spoke with Barnabas Lister. Unfortunately, no patient assistance funds are available at this time.  Judeen Hammans has updated the patient with this information on multiple occassions.

## 2015-07-24 ENCOUNTER — Telehealth: Payer: Self-pay | Admitting: *Deleted

## 2015-07-24 NOTE — Telephone Encounter (Signed)
i told Dr. Rogue Bussing that when I had extra time that I would call pt to see if her blood thinner medication was taken care of with her PCP.  I called her today and she states that switched her to xarelto and gave her a card that she can get 5 prescriptions with it and they are working on getting  A new rx to be approved through [facility].  She is doing ok and the PCP office is working with her.

## 2015-07-24 NOTE — Telephone Encounter (Signed)
I did speak to pt on 11/2 and explain to her that I have checked with [facility] and the PCP office has still not done the prior auth.  When i called the PCP office they told me that they did not send it to [place] but they did send it to [facility] and they would let pt know if there was a problem.  Patient mentioned something about getting the rest of the med before the year runs out and it starts all over again with copay.  I told her she would need to speak to PCP office because they are ordering and managing the drug.  She states the office called her later in the day and told her while they are working on it she could come and get samples from their office.  She was going that day.

## 2015-09-04 DIAGNOSIS — G4489 Other headache syndrome: Secondary | ICD-10-CM | POA: Insufficient documentation

## 2015-09-18 DIAGNOSIS — Z0000 Encounter for general adult medical examination without abnormal findings: Secondary | ICD-10-CM | POA: Insufficient documentation

## 2015-09-18 DIAGNOSIS — Z719 Counseling, unspecified: Secondary | ICD-10-CM | POA: Insufficient documentation

## 2015-09-18 DIAGNOSIS — Z713 Dietary counseling and surveillance: Secondary | ICD-10-CM | POA: Insufficient documentation

## 2015-10-01 DIAGNOSIS — Z86711 Personal history of pulmonary embolism: Secondary | ICD-10-CM | POA: Insufficient documentation

## 2015-10-24 ENCOUNTER — Ambulatory Visit: Payer: Medicare Other

## 2015-10-27 ENCOUNTER — Inpatient Hospital Stay: Payer: Medicare Other | Admitting: Internal Medicine

## 2015-10-27 ENCOUNTER — Telehealth: Payer: Self-pay | Admitting: *Deleted

## 2015-10-27 NOTE — Telephone Encounter (Signed)
Pt no showed for u/s. I asked cancer center scheduling to f/u with this with the patient since pt was on md's schedule for today.  Per Tia in scheduling "pt was called and asked if she wanted to r/s and she stated she would call us back when she was ready to r/s [place] and f/u appt."

## 2015-11-23 ENCOUNTER — Inpatient Hospital Stay
Admission: EM | Admit: 2015-11-23 | Discharge: 2015-11-25 | DRG: 065 | Disposition: A | Discharge status: Home Health | Payer: Medicare Other | Attending: Internal Medicine | Admitting: Internal Medicine

## 2015-11-23 ENCOUNTER — Encounter: Payer: Self-pay | Admitting: Emergency Medicine

## 2015-11-23 ENCOUNTER — Emergency Department: Payer: Medicare Other

## 2015-11-23 DIAGNOSIS — M79605 Pain in left leg: Secondary | ICD-10-CM | POA: Diagnosis present

## 2015-11-23 DIAGNOSIS — E669 Obesity, unspecified: Secondary | ICD-10-CM | POA: Diagnosis present

## 2015-11-23 DIAGNOSIS — D649 Anemia, unspecified: Secondary | ICD-10-CM | POA: Diagnosis present

## 2015-11-23 DIAGNOSIS — J45909 Unspecified asthma, uncomplicated: Secondary | ICD-10-CM | POA: Diagnosis present

## 2015-11-23 DIAGNOSIS — M79604 Pain in right leg: Secondary | ICD-10-CM

## 2015-11-23 DIAGNOSIS — Z880 Allergy status to penicillin: Secondary | ICD-10-CM | POA: Diagnosis not present

## 2015-11-23 DIAGNOSIS — I82409 Acute embolism and thrombosis of unspecified deep veins of unspecified lower extremity: Secondary | ICD-10-CM

## 2015-11-23 DIAGNOSIS — R4781 Slurred speech: Secondary | ICD-10-CM | POA: Diagnosis not present

## 2015-11-23 DIAGNOSIS — Z803 Family history of malignant neoplasm of breast: Secondary | ICD-10-CM

## 2015-11-23 DIAGNOSIS — I10 Essential (primary) hypertension: Secondary | ICD-10-CM | POA: Diagnosis present

## 2015-11-23 DIAGNOSIS — Z8249 Family history of ischemic heart disease and other diseases of the circulatory system: Secondary | ICD-10-CM

## 2015-11-23 DIAGNOSIS — M47816 Spondylosis without myelopathy or radiculopathy, lumbar region: Secondary | ICD-10-CM | POA: Diagnosis present

## 2015-11-23 DIAGNOSIS — Z86718 Personal history of other venous thrombosis and embolism: Secondary | ICD-10-CM

## 2015-11-23 DIAGNOSIS — Z6841 Body Mass Index (BMI) 40.0 and over, adult: Secondary | ICD-10-CM | POA: Diagnosis not present

## 2015-11-23 DIAGNOSIS — I639 Cerebral infarction, unspecified: Secondary | ICD-10-CM | POA: Diagnosis present

## 2015-11-23 DIAGNOSIS — K219 Gastro-esophageal reflux disease without esophagitis: Secondary | ICD-10-CM | POA: Diagnosis present

## 2015-11-23 DIAGNOSIS — Z7901 Long term (current) use of anticoagulants: Secondary | ICD-10-CM

## 2015-11-23 DIAGNOSIS — R042 Hemoptysis: Secondary | ICD-10-CM | POA: Diagnosis present

## 2015-11-23 DIAGNOSIS — I824Y1 Acute embolism and thrombosis of unspecified deep veins of right proximal lower extremity: Secondary | ICD-10-CM

## 2015-11-23 HISTORY — DX: Unspecified asthma, uncomplicated: J45.909

## 2015-11-23 LAB — CBC WITH DIFFERENTIAL/PLATELET
BASOS ABS: 0.1 10*3/uL (ref 0–0.1)
BASOS PCT: 1 %
EOS ABS: 0.2 10*3/uL (ref 0–0.7)
Eosinophils Relative: 3 %
HEMATOCRIT: 35.7 % (ref 35.0–47.0)
Hemoglobin: 12.1 g/dL (ref 12.0–16.0)
Lymphocytes Relative: 42 %
Lymphs Abs: 2.4 10*3/uL (ref 1.0–3.6)
MCH: 28 pg (ref 26.0–34.0)
MCHC: 33.9 g/dL (ref 32.0–36.0)
MCV: 82.5 fL (ref 80.0–100.0)
MONO ABS: 0.4 10*3/uL (ref 0.2–0.9)
Monocytes Relative: 8 %
NEUTROS ABS: 2.7 10*3/uL (ref 1.4–6.5)
Neutrophils Relative %: 46 %
PLATELETS: 193 10*3/uL (ref 150–440)
RBC: 4.32 MIL/uL (ref 3.80–5.20)
RDW: 17 % — AB (ref 11.5–14.5)
WBC: 5.8 10*3/uL (ref 3.6–11.0)

## 2015-11-23 LAB — BASIC METABOLIC PANEL
Anion gap: 3 — ABNORMAL LOW (ref 5–15)
BUN: 17 mg/dL (ref 6–20)
CALCIUM: 9.3 mg/dL (ref 8.9–10.3)
CHLORIDE: 105 mmol/L (ref 101–111)
CO2: 31 mmol/L (ref 22–32)
CREATININE: 0.78 mg/dL (ref 0.44–1.00)
Glucose, Bld: 85 mg/dL (ref 65–99)
Potassium: 3.4 mmol/L — ABNORMAL LOW (ref 3.5–5.1)
SODIUM: 139 mmol/L (ref 135–145)

## 2015-11-23 LAB — TROPONIN I

## 2015-11-23 MED ORDER — LOSARTAN POTASSIUM 50 MG PO TABS
100.0000 mg | ORAL_TABLET | Freq: Every day | ORAL | Status: DC
  Administered 2015-11-24 – 2015-11-25 (×2): 100 mg via ORAL
  Filled 2015-11-23 (×3): qty 2

## 2015-11-23 MED ORDER — DULOXETINE HCL 60 MG PO CPEP
60.0000 mg | ORAL_CAPSULE | Freq: Two times a day (BID) | ORAL | Status: DC
  Administered 2015-11-23 – 2015-11-25 (×4): 60 mg via ORAL
  Filled 2015-11-23 (×4): qty 1

## 2015-11-23 MED ORDER — POTASSIUM CHLORIDE IN NACL 20-0.9 MEQ/L-% IV SOLN
INTRAVENOUS | Status: DC
  Administered 2015-11-23 – 2015-11-24 (×3): via INTRAVENOUS
  Filled 2015-11-23 (×4): qty 1000

## 2015-11-23 MED ORDER — ONDANSETRON HCL 4 MG/2ML IJ SOLN: 4.0000 mg | Freq: Four times a day (QID) | INTRAMUSCULAR | Status: DC | PRN

## 2015-11-23 MED ORDER — LOSARTAN POTASSIUM-HCTZ 100-25 MG PO TABS: 1.0000 | ORAL_TABLET | Freq: Every day | ORAL | Status: DC

## 2015-11-23 MED ORDER — IOHEXOL 350 MG/ML SOLN
75.0000 mL | Freq: Once | INTRAVENOUS | Status: AC | PRN
  Administered 2015-11-23: 75 mL via INTRAVENOUS

## 2015-11-23 MED ORDER — ACETAMINOPHEN 650 MG RE SUPP: 650.0000 mg | Freq: Four times a day (QID) | RECTAL | Status: DC | PRN

## 2015-11-23 MED ORDER — VITAMIN E 180 MG (400 UNIT) PO CAPS
400.0000 [IU] | ORAL_CAPSULE | Freq: Every day | ORAL | Status: DC
  Administered 2015-11-24 – 2015-11-25 (×2): 400 [IU] via ORAL
  Filled 2015-11-23 (×2): qty 1

## 2015-11-23 MED ORDER — HYDROCHLOROTHIAZIDE 25 MG PO TABS
25.0000 mg | ORAL_TABLET | Freq: Every day | ORAL | Status: DC
  Administered 2015-11-24 – 2015-11-25 (×2): 25 mg via ORAL
  Filled 2015-11-23 (×2): qty 1

## 2015-11-23 MED ORDER — ONDANSETRON HCL 4 MG PO TABS: 4.0000 mg | ORAL_TABLET | Freq: Four times a day (QID) | ORAL | Status: DC | PRN

## 2015-11-23 MED ORDER — SODIUM CHLORIDE 0.9% FLUSH
3.0000 mL | Freq: Two times a day (BID) | INTRAVENOUS | Status: DC
  Administered 2015-11-24 – 2015-11-25 (×2): 3 mL via INTRAVENOUS

## 2015-11-23 MED ORDER — ASPIRIN EC 81 MG PO TBEC
81.0000 mg | DELAYED_RELEASE_TABLET | Freq: Every day | ORAL | Status: DC
  Administered 2015-11-24 – 2015-11-25 (×2): 81 mg via ORAL
  Filled 2015-11-23 (×2): qty 1

## 2015-11-23 MED ORDER — HYDRALAZINE HCL 25 MG PO TABS
25.0000 mg | ORAL_TABLET | Freq: Every day | ORAL | Status: DC
  Administered 2015-11-23 – 2015-11-25 (×3): 25 mg via ORAL
  Filled 2015-11-23 (×3): qty 1

## 2015-11-23 MED ORDER — APIXABAN 5 MG PO TABS
5.0000 mg | ORAL_TABLET | Freq: Two times a day (BID) | ORAL | Status: DC
  Administered 2015-11-23 – 2015-11-24 (×3): 5 mg via ORAL
  Filled 2015-11-23 (×3): qty 1

## 2015-11-23 MED ORDER — DOCUSATE SODIUM 100 MG PO CAPS
100.0000 mg | ORAL_CAPSULE | Freq: Two times a day (BID) | ORAL | Status: DC
  Administered 2015-11-23 – 2015-11-25 (×4): 100 mg via ORAL
  Filled 2015-11-23 (×5): qty 1

## 2015-11-23 MED ORDER — ACETAMINOPHEN 325 MG PO TABS
650.0000 mg | ORAL_TABLET | Freq: Four times a day (QID) | ORAL | Status: DC | PRN
  Administered 2015-11-24 (×2): 650 mg via ORAL
  Filled 2015-11-23 (×2): qty 2

## 2015-11-23 MED ORDER — ASPIRIN 81 MG PO CHEW
324.0000 mg | CHEWABLE_TABLET | Freq: Once | ORAL | Status: AC
  Administered 2015-11-23: 324 mg via ORAL
  Filled 2015-11-23: qty 4

## 2015-11-23 MED ORDER — ALBUTEROL SULFATE (2.5 MG/3ML) 0.083% IN NEBU: 3.0000 mL | INHALATION_SOLUTION | RESPIRATORY_TRACT | Status: DC | PRN

## 2015-11-23 MED ORDER — BIOTIN 5000 MCG PO CAPS: 1.0000 | ORAL_CAPSULE | Freq: Every day | ORAL | Status: DC

## 2015-11-23 MED ORDER — BISACODYL 10 MG RE SUPP: 10.0000 mg | Freq: Every day | RECTAL | Status: DC | PRN

## 2015-11-23 MED ORDER — MORPHINE SULFATE (PF) 2 MG/ML IV SOLN
2.0000 mg | INTRAVENOUS | Status: DC | PRN
  Administered 2015-11-24: 04:00:00 2 mg via INTRAVENOUS
  Filled 2015-11-23: qty 1

## 2015-11-23 MED ORDER — PANTOPRAZOLE SODIUM 40 MG PO TBEC
40.0000 mg | DELAYED_RELEASE_TABLET | Freq: Every day | ORAL | Status: DC
Start: 2015-11-24 — End: 2015-11-25
  Administered 2015-11-24 – 2015-11-25 (×2): 40 mg via ORAL
  Filled 2015-11-23 (×3): qty 1

## 2015-11-23 NOTE — ED Notes (Signed)
Attempted to call report to the floor to give report, Judson Roch Investment banker, corporate) unavailable at this time.  Will return my call once she is done getting report on her assignments.

## 2015-11-23 NOTE — ED Notes (Signed)
Patient transported to MRI 

## 2015-11-23 NOTE — ED Notes (Signed)
Pt states on Friday night she had swelling to eyes, slurred speech, and started coughing and coughed up blood. Pt is concerned if she had a TIA. Pt also states she is having bilateral leg pain. Pt was diagnosed with DVT 2-3 months ago in right leg. Pt has been on blood thinner for over a year due to previous clot.

## 2015-11-23 NOTE — H&P (Signed)
History and Physical    Lindsay Cooke [id number] DOB: November 28, 1960 DOA: 11/23/2015  Referring physician: Dr. Archie Balboa PCP: No primary care provider on file.  Specialists: none  Chief Complaint: slurred speech  HPI: Lindsay Cooke is a 55 y.o. 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 Also has had cough and mild hemoptysis. In ER, MRI positive for acute CVA/infarct. Denies HA or blurred vision. No focal numbness or weakness. She is now admitted. Has been on Eliquis for over a year now. Last DVT was 2-3 months ago. Has never had a coagulopathy w/u.  Review of Systems: The patient denies anorexia, fever, weight loss,, vision loss, decreased hearing, hoarseness, chest pain, syncope, dyspnea on exertion, peripheral edema, balance deficits, abdominal pain, melena, hematochezia, severe indigestion/heartburn, hematuria, incontinence, genital sores, muscle weakness, suspicious skin lesions, transient blindness, difficulty walking, depression, unusual weight change, abnormal bleeding, enlarged lymph nodes, angioedema, and breast masses.   Past Medical History  Diagnosis Date  . Reflux   . Benign essential hypertension   . Right leg DVT ([place])   . Obesity   . Lumbar spondylosis   . Anemia    Past Surgical History  Procedure Laterality Date  . Cholecystectomy    . Keloid excision    . Hernia repair     Social History:  reports that she has never smoked. She has never used smokeless tobacco. She reports that she does not drink alcohol or use illicit drugs.  Allergies  Allergen Reactions  . Penicillins Hives and Rash    Family History  Problem Relation Age of Onset  . Breast cancer Sister   . Heart disease Mother   . Congestive Heart Failure Sister     Prior to Admission medications   Medication Sig Start Date End Date Taking? Authorizing Provider  albuterol (PROAIR HFA) 108  (90 BASE) MCG/ACT inhaler Inhale 1 Inhaler into the lungs daily as needed. Shortness of breath 05/01/15   Historical Provider, MD  albuterol (PROVENTIL HFA;VENTOLIN HFA) 108 (90 BASE) MCG/ACT inhaler Inhale 2 puffs into the lungs every 6 (six) hours as needed for wheezing or shortness of breath. 02/22/15   Johnn Hai, PA-C  apixaban (ELIQUIS) 5 MG TABS tablet Take 5 mg by mouth 2 (two) times daily.    Historical Provider, MD  Apple Cider Vinegar (APPLE CIDER VINEGAR ULTRA) 600 MG CAPS Take 1 capsule by mouth daily.    Historical Provider, MD  Biotin 5000 MCG CAPS Take 1 capsule by mouth daily.    Historical Provider, MD  Cetirizine HCl 10 MG CAPS Take 10 mg by mouth daily. 09/21/12   Historical Provider, MD  Cholecalciferol (D3-1000 PO) Take 1 tablet by mouth daily.    Historical Provider, MD  Cinnamon 500 MG capsule Take 1 capsule by mouth daily.    Historical Provider, MD  DULoxetine (CYMBALTA) 60 MG capsule Take 60 mg by mouth 2 (two) times daily.    Historical Provider, MD  Elastic Bandages & Supports (MEDICAL COMPRESSION THIGH HIGH) MISC by Does not apply route.    Historical Provider, MD  FLUARIX QUADRIVALENT 0.5 ML injection inject 0.5 milliliter intramuscularly 05/22/15   Historical Provider, MD  hydrALAZINE (APRESOLINE) 25 MG tablet Take 25 mg by mouth daily. 04/25/15   Historical Provider, MD  losartan-hydrochlorothiazide (HYZAAR) 100-25 MG tablet Take 1 tablet by mouth daily. 04/25/15   Historical Provider, MD  omeprazole ([place])  40 MG capsule Take 1 capsule by mouth daily. 05/01/15   Historical Provider, MD  Prenatal Vit-Fe Fumarate-FA (MULTIVITAMIN-PRENATAL) 27-0.8 MG TABS tablet Take 1 tablet by mouth daily at 12 noon.    Historical Provider, MD  vitamin C (ASCORBIC ACID) 500 MG tablet Take 500 mg by mouth daily.    Historical Provider, MD  vitamin E (VITAMIN E) 400 UNIT capsule Take 400 Units by mouth daily.    Historical Provider, MD   Physical Exam: Filed Vitals:   11/23/15  1324 11/23/15 1330 11/23/15 1430 11/23/15 1500  BP: 125/81 117/79 121/81 138/75  Pulse: 77 80 80 70  Temp:      TempSrc:      Resp: 15 20 25 15   Height:      Weight:      SpO2: 98% 98% 98% 100%     General:  WDWN in NAD, scarring noted to right face, o/w [place]/AT  Eyes: PERRL, EOMI, no scleral icterus, conjunctiva clear  ENT: moist oropharynx without lesions or exudate, dentition fair, TM's benign  Neck: supple, no lymphadenopathy. No bruits or thyromegaly  Cardiovascular: regular rate without MRG; 2+ peripheral pulses, no JVD, no peripheral edema  Respiratory: CTA biL, good air movement without wheezing, rhonchi or crackled, respiratory effort normal  Abdomen: soft, non tender to palpation, positive bowel sounds, no guarding, no rebound, no organomegaly  Skin: no rashes or lesions. Keloid noted to right face  Musculoskeletal: normal bulk and tone, no joint swelling  Psychiatric: normal mood and affect, A&OX3  Neurologic: CN 2-12 grossly intact, Motor strength 5/5 in all 4 groups, DTR's symmetric, sensory exam non-focal  Labs on Admission:  Basic Metabolic Panel:  Recent Labs Lab 11/23/15 1226  NA 139  K 3.4*  CL 105  CO2 31  GLUCOSE 85  BUN 17  CREATININE 0.78  CALCIUM 9.3   Liver Function Tests: No results for input(s): AST, ALT, ALKPHOS, BILITOT, PROT, ALBUMIN in the last 168 hours. No results for input(s): LIPASE, AMYLASE in the last 168 hours. No results for input(s): AMMONIA in the last 168 hours. CBC:  Recent Labs Lab 11/23/15 1226  WBC 5.8  NEUTROABS 2.7  HGB 12.1  HCT 35.7  MCV 82.5  PLT 193   Cardiac Enzymes:  Recent Labs Lab 11/23/15 1226  TROPONINI <0.03    BNP (last 3 results) No results for input(s): BNP in the last 8760 hours.  ProBNP (last 3 results) No results for input(s): PROBNP in the last 8760 hours.  CBG: No results for input(s): GLUCAP in the last 168 hours.  Radiological Exams on Admission: Ct Head Wo  Contrast  11/23/2015  CLINICAL DATA:  Slurred speech. EXAM: CT HEAD WITHOUT CONTRAST TECHNIQUE: Contiguous axial images were obtained from the base of the skull through the vertex without intravenous contrast. COMPARISON:  05/29/2014 head CT. FINDINGS: The anterior temporal fossa is poorly visualized due to extensive streak artifact from the skull base. No evidence of parenchymal hemorrhage or extra-axial fluid collection. No mass lesion, mass effect, or midline shift. No CT evidence of acute infarct. Cerebral volume is age appropriate. No ventriculomegaly. The visualized paranasal sinuses are essentially clear. The mastoid air cells are unopacified. No evidence of calvarial fracture. IMPRESSION: Negative head CT.  No acute intracranial abnormality. Electronically Signed   By: Ilona Sorrel M.D.   On: 11/23/2015 13:42   Mr Brain Wo Contrast  11/23/2015  CLINICAL DATA:  Slurred speech.  Eye swelling.  Hemoptysis. EXAM: MRI HEAD WITHOUT CONTRAST TECHNIQUE:  Multiplanar, multiecho pulse sequences of the brain and surrounding structures were obtained without intravenous contrast. COMPARISON:  CT head 11/23/2015 FINDINGS: Two small areas of possible restricted diffusion in the right parietal cortex suggestive of small acute infarcts. These measure under 5 mm each. No other areas of restricted diffusion. Negative for chronic ischemia. Negative for demyelinating disease. Cerebral white matter normal Ventricle size normal.  Cerebral volume normal. Negative for hemorrhage or mass. No shift of the midline structures. Image quality degraded by mild motion Paranasal sinuses clear.  Pituitary normal in size.  Normal orbit. IMPRESSION: Probable small areas of acute infarct in the right parietal cortex. Otherwise negative. Mild image quality degradation due to motion. Electronically Signed   By: Franchot Gallo M.D.   On: 11/23/2015 16:44    EKG: Independently reviewed.  Assessment/Plan Principal Problem:   CVA (cerebral  vascular accident) ([place]) Active Problems:   OBESITY   Hemoptysis   DVT, lower extremity, recurrent ([place])   Will admit to floor with Eliquis and ASA. CT chest for hemoptysis. Order echo and carotids and Neurology consult. Consider coagulopathy w/u. [place] bilateral LE. Repeat labs in AM  Diet: clear liquids Fluids: NS with K+@100  DVT Prophylaxis: Eliquis  Code Status: FULL  Family Communication: none  Disposition Plan: home  Time spent: 55 min

## 2015-11-23 NOTE — ED Notes (Signed)
Pt returned to room by this tech

## 2015-11-23 NOTE — ED Notes (Signed)
Patient states she is on eliquis for blood clots but she has also noticed some slurred speech and numbness on her left side since Friday.

## 2015-11-23 NOTE — ED Notes (Signed)
MD at bedside. 

## 2015-11-23 NOTE — ED Provider Notes (Signed)
[facility] Emergency Department Provider Note   ____________________________________________  Time seen: ~1225  I have reviewed the triage vital signs and the nursing notes.   HISTORY  Chief Complaint Slurred speech  History limited by: Not Limited   HPI Lindsay Cooke is a 55 y.o. female with history of DVT, HTN who presents to the emergency department today with primary complaint of slurred speech. She states that it started suddenly two days ago. It has been present since than although she thinks it has improved. She denied any new headache two days ago although states she does have chronic headaches. She also has complaints of bilateral leg pain for which she is being followed by her PCP. Also complaining of bilateral upper eye lid swelling. Also complaining of right hand pain for a long time. Denies any fevers. Denies any trauma to her head.   Past Medical History  Diagnosis Date  . Reflux   . Benign essential hypertension   . Right leg DVT ([place])   . Obesity   . Lumbar spondylosis   . Anemia     Patient Active Problem List   Diagnosis Date Noted  . FATIGUE 10/03/2008  . SNORING 10/03/2008  . GUAIAC POSITIVE STOOL 05/07/2008  . GASTROENTERITIS 02/23/2008  . DEPRESSION 11/27/2007  . MEDIAL MENISCUS TEAR, RIGHT 11/20/2007  . ANEMIA 11/15/2007  . OSTEOARTHRITIS, KNEE 11/15/2007  . GERD 09/06/2007  . OBESITY 07/21/2007  . HYPERTENSION 07/21/2007  . ASTHMA 07/21/2007  . KELOID 07/21/2007    Past Surgical History  Procedure Laterality Date  . Cholecystectomy    . Keloid excision    . Hernia repair      Current Outpatient Rx  Name  Route  Sig  Dispense  Refill  . albuterol (PROAIR HFA) 108 (90 BASE) MCG/ACT inhaler   Inhalation   Inhale 1 Inhaler into the lungs daily as needed. Shortness of breath         . albuterol (PROVENTIL HFA;VENTOLIN HFA) 108 (90 BASE) MCG/ACT inhaler   Inhalation   Inhale 2 puffs into the lungs every 6  (six) hours as needed for wheezing or shortness of breath.   1 Inhaler   2   . apixaban (ELIQUIS) 5 MG TABS tablet   Oral   Take 5 mg by mouth 2 (two) times daily.         Marland Kitchen Apple Cider Vinegar (APPLE CIDER VINEGAR ULTRA) 600 MG CAPS   Oral   Take 1 capsule by mouth daily.         . Biotin 5000 MCG CAPS   Oral   Take 1 capsule by mouth daily.         . Cetirizine HCl 10 MG CAPS   Oral   Take 10 mg by mouth daily.         . Cholecalciferol (D3-1000 PO)   Oral   Take 1 tablet by mouth daily.         . Cinnamon 500 MG capsule   Oral   Take 1 capsule by mouth daily.         . DULoxetine (CYMBALTA) 60 MG capsule   Oral   Take 60 mg by mouth 2 (two) times daily.         Water engineer Bandages & Supports (MEDICAL COMPRESSION THIGH HIGH) MISC   Does not apply   by Does not apply route.         Marland Kitchen FLUARIX QUADRIVALENT 0.5 ML injection  inject 0.5 milliliter intramuscularly      0     Dispense as written.   . hydrALAZINE (APRESOLINE) 25 MG tablet   Oral   Take 25 mg by mouth daily.         Marland Kitchen losartan-hydrochlorothiazide (HYZAAR) 100-25 MG tablet   Oral   Take 1 tablet by mouth daily.         Marland Kitchen omeprazole (PRILOSEC) 40 MG capsule   Oral   Take 1 capsule by mouth daily.         . Prenatal Vit-Fe Fumarate-FA (MULTIVITAMIN-PRENATAL) 27-0.8 MG TABS tablet   Oral   Take 1 tablet by mouth daily at 12 noon.         . vitamin C (ASCORBIC ACID) 500 MG tablet   Oral   Take 500 mg by mouth daily.         . vitamin E (VITAMIN E) 400 UNIT capsule   Oral   Take 400 Units by mouth daily.           Allergies Penicillins  Family History  Problem Relation Age of Onset  . Breast cancer Sister   . Heart disease Mother   . Congestive Heart Failure Sister     Social History Social History  Substance Use Topics  . Smoking status: Never Smoker   . Smokeless tobacco: Never Used  . Alcohol Use: No    Review of Systems  Constitutional:  Negative for fever. Cardiovascular: Negative for chest pain. Respiratory: Negative for shortness of breath. Gastrointestinal: Negative for abdominal pain, vomiting and diarrhea. Neurological: Slurred speech. Chronic headache.  10-point ROS otherwise negative.  ____________________________________________   PHYSICAL EXAM:  VITAL SIGNS:   98.5 F (36.9 C)  81  18   136/106     Constitutional: Alert and oriented. Well appearing and in no distress. Eyes: Conjunctivae are normal. PERRL. Normal extraocular movements. ENT   Head: Normocephalic and atraumatic.   Nose: No congestion/rhinnorhea.   Mouth/Throat: Mucous membranes are moist.   Neck: No stridor. Hematological/Lymphatic/Immunilogical: No cervical lymphadenopathy. Cardiovascular: Normal rate, regular rhythm.  No murmurs, rubs, or gallops. Respiratory: Normal respiratory effort without tachypnea nor retractions. Breath sounds are clear and equal bilaterally. No wheezes/rales/rhonchi. Gastrointestinal: Soft and nontender. No distention. There is no CVA tenderness. Genitourinary: Deferred Musculoskeletal: Normal range of motion in all extremities. No joint effusions.  No lower extremity tenderness nor edema. Neurologic:  No appreciable slurred speech. Symmetric smile. Tongue midline. Strength 5/5 in upper and lower extremities. Sensation grossly intact. Normal finger to nose.  Skin:  Skin is warm, dry and intact. No rash noted. Psychiatric: Mood and affect are normal. Speech and behavior are normal. Patient exhibits appropriate insight and judgment.  ____________________________________________    LABS (pertinent positives/negatives)  Labs Reviewed  CBC WITH DIFFERENTIAL/PLATELET - Abnormal; Notable for the following:    RDW 17.0 (*)    All other components within normal limits  BASIC METABOLIC PANEL - Abnormal; Notable for the following:    Potassium 3.4 (*)    Anion gap 3 (*)    All other components within  normal limits  TROPONIN I     ____________________________________________   EKG  I, Nance Pear, attending physician, personally viewed and interpreted this EKG  EKG Time: 1710 Rate: 80 Rhythm: normal sinus rhythm Axis: normal Intervals: qtc 449 QRS: narrow, LVH ST changes: no st elevation Impression: abnormal ekg ____________________________________________    RADIOLOGY  CT head IMPRESSION: Negative head CT. No acute intracranial abnormality.  MRI brain  IMPRESSION: Probable small areas of acute infarct in the right parietal cortex. Otherwise negative.  Mild image quality degradation due to motion.   ____________________________________________   PROCEDURES  Procedure(s) performed: None  Critical Care performed: No  ____________________________________________   INITIAL IMPRESSION / ASSESSMENT AND PLAN / ED COURSE  Pertinent labs & imaging results that were available during my care of the patient were reviewed by me and considered in my medical decision making (see chart for details).  Patient presented to the emergency department today with a primary concern for slurred speech for 2 days. CT head was negative however given the continued slurred speech and MRI was obtained. This did show 2 areas of acute infarct. This is concerning for acute stroke. Patient is already on Eliquis. Will plan on admission to the hospital service for further workup and evaluation.  ____________________________________________   FINAL CLINICAL IMPRESSION(S) / ED DIAGNOSES  Final diagnoses:  Cerebral infarction due to unspecified mechanism     Nance Pear, MD 11/23/15 1714

## 2015-11-23 NOTE — Progress Notes (Signed)
PHARMACIST - PHYSICIAN ORDER COMMUNICATION  CONCERNING: P&T Medication Policy on Herbal Medications  DESCRIPTION:  This patient's order for:  Biotin  has been noted.  This product(s) is classified as an "herbal" or natural product. Due to a lack of definitive safety studies or FDA approval, nonstandard manufacturing practices, plus the potential risk of unknown drug-drug interactions while on inpatient medications, the Pharmacy and Therapeutics Committee does not permit the use of "herbal" or natural products of this type within [facility].   ACTION TAKEN: The pharmacy department is unable to verify this order at this time and your patient has been informed of this safety policy. Please reevaluate patient's clinical condition at discharge and address if the herbal or natural product(s) should be resumed at that time.  Paulina Fusi, PharmD, BCPS 11/23/2015 8:55 PM

## 2015-11-23 NOTE — ED Notes (Signed)
MRI technician called patient and screened her over the phone.  Will call back when patient is ready to be transported to MRI room.

## 2015-11-24 ENCOUNTER — Inpatient Hospital Stay
Admit: 2015-11-24 | Discharge: 2015-11-24 | Disposition: A | Discharge status: Home / Self Care | Payer: Medicare Other | Attending: Internal Medicine | Admitting: Internal Medicine

## 2015-11-24 DIAGNOSIS — I639 Cerebral infarction, unspecified: Principal | ICD-10-CM

## 2015-11-24 LAB — COMPREHENSIVE METABOLIC PANEL
ALT: 21 U/L (ref 14–54)
ANION GAP: 5 (ref 5–15)
AST: 19 U/L (ref 15–41)
Albumin: 3.7 g/dL (ref 3.5–5.0)
Alkaline Phosphatase: 57 U/L (ref 38–126)
BUN: 14 mg/dL (ref 6–20)
CALCIUM: 8.7 mg/dL — AB (ref 8.9–10.3)
CHLORIDE: 108 mmol/L (ref 101–111)
CO2: 26 mmol/L (ref 22–32)
Creatinine, Ser: 0.79 mg/dL (ref 0.44–1.00)
GFR calc non Af Amer: >60 mL/min (ref >60)
Glucose, Bld: 108 mg/dL — ABNORMAL HIGH (ref 65–99)
Potassium: 3.7 mmol/L (ref 3.5–5.1)
SODIUM: 139 mmol/L (ref 135–145)
Total Bilirubin: 0.8 mg/dL (ref 0.3–1.2)
Total Protein: 6.5 g/dL (ref 6.5–8.1)

## 2015-11-24 LAB — CBC
HCT: 32.8 % — ABNORMAL LOW (ref 35.0–47.0)
Hemoglobin: 11.7 g/dL — ABNORMAL LOW (ref 12.0–16.0)
MCH: 29.1 pg (ref 26.0–34.0)
MCHC: 35.6 g/dL (ref 32.0–36.0)
MCV: 81.7 fL (ref 80.0–100.0)
PLATELETS: 175 10*3/uL (ref 150–440)
RBC: 4.01 MIL/uL (ref 3.80–5.20)
RDW: 16.5 % — ABNORMAL HIGH (ref 11.5–14.5)
WBC: 8.2 10*3/uL (ref 3.6–11.0)

## 2015-11-24 LAB — ECHOCARDIOGRAM COMPLETE
HEIGHTINCHES: 61 in
WEIGHTICAEL: 4608 [oz_av]

## 2015-11-24 LAB — SEDIMENTATION RATE: Sed Rate: 48 mm/hr — ABNORMAL HIGH (ref 0–30)

## 2015-11-24 LAB — TROPONIN I

## 2015-11-24 LAB — GLUCOSE, CAPILLARY
GLUCOSE-CAPILLARY: 128 mg/dL — AB (ref 65–99)
GLUCOSE-CAPILLARY: 78 mg/dL (ref 65–99)

## 2015-11-24 MED ORDER — BUTALBITAL-APAP-CAFFEINE 50-325-40 MG PO TABS
1.0000 | ORAL_TABLET | Freq: Once | ORAL | Status: AC
  Administered 2015-11-24: 1 via ORAL
  Filled 2015-11-24: qty 1

## 2015-11-24 MED ORDER — HYDROCODONE-ACETAMINOPHEN 5-325 MG PO TABS: 1.0000 | ORAL_TABLET | ORAL | Status: DC | PRN

## 2015-11-24 NOTE — Progress Notes (Signed)
Pt complains of CP that radiates to right arm.  Spoke with Dr Melynda Ripple and ordered stat troponin and EKG.  Notified both services. Dorna Bloom RN

## 2015-11-24 NOTE — Consult Note (Signed)
Referring Physician: Anselm Jungling    Chief Complaint: Slurred speech  HPI: Lindsay Cooke is an 55 y.o. female who reports that she awakened on 3/17 with slurred speech.  Did not notice any vision changes or weakness/numbness.  When her speech did not improve over the weekend she presented for evaluation.   Patient with a history of DVT on Eliquis.  Patient reports compliance.   Initial NIHSS of 1.    Date last known well: 11/20/2015 Time last known well: Time: 22:00 tPA Given: No: Outside time window, onEliquis  MRankin: 0  Past Medical History  Diagnosis Date  . Reflux   . Benign essential hypertension   . Right leg DVT ([place])   . Obesity   . Lumbar spondylosis   . Anemia   . Asthma     Past Surgical History  Procedure Laterality Date  . Cholecystectomy    . Keloid excision    . Hernia repair      Family History  Problem Relation Age of Onset  . Breast cancer Sister   . Heart disease Mother   . Congestive Heart Failure Sister    Social History:  reports that she has never smoked. She has never used smokeless tobacco. She reports that she does not drink alcohol or use illicit drugs.  Allergies:  Allergies  Allergen Reactions  . Penicillins Hives and Rash    Has patient had a PCN reaction causing immediate rash, facial/tongue/throat swelling, SOB or lightheadedness with hypotension: Yes Has patient had a PCN reaction causing severe rash involving mucus membranes or skin necrosis: No Has patient had a PCN reaction that required hospitalization No Has patient had a PCN reaction occurring within the last 10 years: No If all of the above answers are "NO", then may proceed with Cephalosporin use.    Medications:  I have reviewed the patient's current medications. Prior to Admission:  Prescriptions prior to admission  Medication Sig Dispense Refill Last Dose  . albuterol (PROVENTIL HFA;VENTOLIN HFA) 108 (90 BASE) MCG/ACT inhaler Inhale 2 puffs into the lungs every 6 (six)  hours as needed for wheezing or shortness of breath. 1 Inhaler 2 unknown  . apixaban (ELIQUIS) 5 MG TABS tablet Take 5 mg by mouth 2 (two) times daily.   11/22/2015 at Unknown time  . Apple Cider Vinegar (APPLE CIDER VINEGAR ULTRA) 600 MG CAPS Take 1 capsule by mouth daily.   11/22/2015 at Unknown time  . cetirizine (ZYRTEC) 10 MG tablet Take 10 mg by mouth daily.   11/22/2015 at Unknown time  . Cholecalciferol (D3-1000 PO) Take 1,000 Units by mouth daily.    11/22/2015 at Unknown time  . Cinnamon 500 MG capsule Take 500 mg by mouth daily.    11/22/2015 at Unknown time  . DULoxetine (CYMBALTA) 60 MG capsule Take 60 mg by mouth 2 (two) times daily.   11/22/2015 at Unknown time  . hydrALAZINE (APRESOLINE) 25 MG tablet Take 25 mg by mouth daily.   11/22/2015 at Unknown time  . losartan-hydrochlorothiazide (HYZAAR) 100-25 MG tablet Take 1 tablet by mouth daily.   11/22/2015 at Unknown time  . omeprazole (PRILOSEC) 40 MG capsule Take 1 capsule by mouth daily.   11/22/2015 at Unknown time  . PATADAY 0.2 % SOLN Place 1 drop into both eyes daily as needed. For dry and itchy eyes.   unknown  . vitamin C (ASCORBIC ACID) 500 MG tablet Take 500 mg by mouth daily.   11/22/2015 at Unknown time  . vitamin  E (VITAMIN E) 400 UNIT capsule Take 400 Units by mouth daily.   11/22/2015 at Unknown time   Scheduled: . apixaban  5 mg Oral BID  . aspirin EC  81 mg Oral Daily  . docusate sodium  100 mg Oral BID  . DULoxetine  60 mg Oral BID  . hydrALAZINE  25 mg Oral Daily  . losartan  100 mg Oral Daily   And  . hydrochlorothiazide  25 mg Oral Daily  . pantoprazole  40 mg Oral Daily  . sodium chloride flush  3 mL Intravenous Q12H  . vitamin E  400 Units Oral Daily    ROS: History obtained from the patient  General ROS: negative for - chills, fatigue, fever, night sweats, weight gain or weight loss Psychological ROS: negative for - behavioral disorder, hallucinations, memory difficulties, mood swings or suicidal  ideation Ophthalmic ROS: left eye swelling ENT ROS: negative for - epistaxis, nasal discharge, oral lesions, sore throat, tinnitus or vertigo Allergy and Immunology ROS: negative for - hives or itchy/watery eyes Hematological and Lymphatic ROS: negative for - bleeding problems, bruising or swollen lymph nodes Endocrine ROS: negative for - galactorrhea, hair pattern changes, polydipsia/polyuria or temperature intolerance Respiratory ROS: negative for - cough, hemoptysis, shortness of breath or wheezing Cardiovascular ROS: negative for - chest pain, dyspnea on exertion, edema or irregular heartbeat Gastrointestinal ROS: negative for - abdominal pain, diarrhea, hematemesis, nausea/vomiting or stool incontinence Genito-Urinary ROS: negative for - dysuria, hematuria, incontinence or urinary frequency/urgency Musculoskeletal ROS: BLE pain Neurological ROS: as noted in HPI Dermatological ROS: negative for rash and skin lesion changes  Physical Examination: Blood pressure 134/77, pulse 101, temperature 98.3 F (36.8 C), temperature source Oral, resp. rate 18, height 5\' 1"  (1.549 m), weight 130.636 kg (288 lb), SpO2 100 %.  HEENT-  Normocephalic, no lesions, without obvious abnormality.  Normal external eye and conjunctiva.  Normal TM's bilaterally.  Normal auditory canals and external ears. Normal external nose, mucus membranes and septum.  Normal pharynx. Cardiovascular- S1, S2 normal, pulses palpable throughout   Lungs- chest clear, no wheezing, rales, normal symmetric air entry Abdomen- soft, non-tender; bowel sounds normal; no masses,  no organomegaly Extremities- LE edema Lymph-no adenopathy palpable Musculoskeletal-muscular tenderness in the legs bilaterally Skin-warm and dry, no hyperpigmentation, vitiligo, or suspicious lesions  Neurological Examination Mental Status: Alert, oriented, thought content appropriate.  Speech fluent without evidence of aphasia.  Mild dysarthria.  Able to  follow 3 step commands without difficulty. Cranial Nerves: II: Discs flat bilaterally; Visual fields grossly normal, pupils equal, round, reactive to light and accommodation III,IV, VI: ptosis not present, extra-ocular motions intact bilaterally V,VII: decrease in right NLF, facial light touch sensation normal bilaterally VIII: hearing normal bilaterally IX,X: gag reflex present XI: bilateral shoulder shrug XII: midline tongue extension Motor: Right : Upper extremity   5/5    Left:     Upper extremity   5/5  Lower extremity   5/5     Lower extremity   5/5 Tone and bulk:normal tone throughout; no atrophy noted Sensory: Pinprick and light touch intact throughout, bilaterally Deep Tendon Reflexes: 1+ and symmetric with absent AJ's bilaterally Plantars: Right: upgoing   Left: downgoing Cerebellar: Normal finger-to-nose and normal heel-to-shin testing bilaterally Gait: not tested due to safety concerns   Laboratory Studies:  Basic Metabolic Panel:  Recent Labs Lab 11/23/15 1226 11/24/15 0415  NA 139 139  K 3.4* 3.7  CL 105 108  CO2 31 26  GLUCOSE 85 108*  BUN 17 14  CREATININE 0.78 0.79  CALCIUM 9.3 8.7*    Liver Function Tests:  Recent Labs Lab 11/24/15 0415  AST 19  ALT 21  ALKPHOS 57  BILITOT 0.8  PROT 6.5  ALBUMIN 3.7   No results for input(s): LIPASE, AMYLASE in the last 168 hours. No results for input(s): AMMONIA in the last 168 hours.  CBC:  Recent Labs Lab 11/23/15 1226 11/24/15 0415  WBC 5.8 8.2  NEUTROABS 2.7  --   HGB 12.1 11.7*  HCT 35.7 32.8*  MCV 82.5 81.7  PLT 193 175    Cardiac Enzymes:  Recent Labs Lab 11/23/15 1226 11/24/15 0415  TROPONINI <0.03 <0.03    BNP: Invalid input(s): POCBNP  CBG:  Recent Labs Lab 11/24/15 0758  GLUCAP 128*    Microbiology: No results found for this or any previous visit.  Coagulation Studies: No results for input(s): LABPROT, INR in the last 72 hours.  Urinalysis: No results for  input(s): COLORURINE, LABSPEC, PHURINE, GLUCOSEU, HGBUR, BILIRUBINUR, KETONESUR, PROTEINUR, UROBILINOGEN, NITRITE, LEUKOCYTESUR in the last 168 hours.  Invalid input(s): APPERANCEUR  Lipid Panel:    Component Value Date/Time   CHOL 192 09/06/2007 2218   TRIG 71 09/06/2007 2218   HDL 64 09/06/2007 2218   CHOLHDL 3.0 Ratio 09/06/2007 2218   VLDL 14 09/06/2007 2218   LDLCALC 114* 09/06/2007 2218    HgbA1C: No results found for: HGBA1C  Urine Drug Screen:  No results found for: LABOPIA, COCAINSCRNUR, LABBENZ, AMPHETMU, THCU, LABBARB  Alcohol Level: No results for input(s): ETH in the last 168 hours.  Other results: EKG: normal sinus rhythm at 83 bpm.  Imaging: Ct Head Wo Contrast  11/23/2015  CLINICAL DATA:  Slurred speech. EXAM: CT HEAD WITHOUT CONTRAST TECHNIQUE: Contiguous axial images were obtained from the base of the skull through the vertex without intravenous contrast. COMPARISON:  05/29/2014 head CT. FINDINGS: The anterior temporal fossa is poorly visualized due to extensive streak artifact from the skull base. No evidence of parenchymal hemorrhage or extra-axial fluid collection. No mass lesion, mass effect, or midline shift. No CT evidence of acute infarct. Cerebral volume is age appropriate. No ventriculomegaly. The visualized paranasal sinuses are essentially clear. The mastoid air cells are unopacified. No evidence of calvarial fracture. IMPRESSION: Negative head CT.  No acute intracranial abnormality. Electronically Signed   By: Ilona Sorrel M.D.   On: 11/23/2015 13:42   Ct Angio Chest Pe W/cm &/or Wo Cm  11/23/2015  CLINICAL DATA:  Hemoptysis 2 days ago. History of blood clots. Anti coagulation with Eliquis. EXAM: CT ANGIOGRAPHY CHEST WITH CONTRAST TECHNIQUE: Multidetector CT imaging of the chest was performed using the standard protocol during bolus administration of intravenous contrast. Multiplanar CT image reconstructions and MIPs were obtained to evaluate the vascular  anatomy. CONTRAST:  42mL OMNIPAQUE IOHEXOL 350 MG/ML SOLN COMPARISON:  Multiple exams, including 02/12/2015 and 06/08/2008 FINDINGS: Body habitus reduces diagnostic sensitivity and specificity. Mediastinum/Nodes: No filling defect is identified in the pulmonary arterial tree to suggest pulmonary embolus. Mild cardiomegaly. Lungs/Pleura: Linear high density branching structures in the right middle lobe anteriorly on images 68-74 of series 7 and similar structures in the right lower lobe noted. Similar linear high density structures are present in the posterior basal segments of the lower lobes. The lungs appear otherwise clear. Upper abdomen: Unremarkable Musculoskeletal: Lower thoracic spondylosis. Review of the MIP images confirms the above findings. IMPRESSION: 1. No filling defect is identified in the pulmonary arterial tree to suggest pulmonary embolus.  2. There is some tiny linear branching densities in the periphery of the right middle lobe and seen to a lesser extent in the lower lobes and lingula. Differential diagnostic considerations include small broncho let us along bronchiectatic peripheral airways related to prior atypical infectious process, old granulomatous disease, or possibly remote and likely clinically inconsequential methacrylate embolization, if the patient has had of vertebral augmentation or implant. In any case these branching densities are not appreciably changed from 2014. 3. Lower thoracic spondylosis. Electronically Signed   By: Van Clines M.D.   On: 11/23/2015 18:18   Mr Brain Wo Contrast  11/23/2015  CLINICAL DATA:  Slurred speech.  Eye swelling.  Hemoptysis. EXAM: MRI HEAD WITHOUT CONTRAST TECHNIQUE: Multiplanar, multiecho pulse sequences of the brain and surrounding structures were obtained without intravenous contrast. COMPARISON:  CT head 11/23/2015 FINDINGS: Two small areas of possible restricted diffusion in the right parietal cortex suggestive of small acute  infarcts. These measure under 5 mm each. No other areas of restricted diffusion. Negative for chronic ischemia. Negative for demyelinating disease. Cerebral white matter normal Ventricle size normal.  Cerebral volume normal. Negative for hemorrhage or mass. No shift of the midline structures. Image quality degraded by mild motion Paranasal sinuses clear.  Pituitary normal in size.  Normal orbit. IMPRESSION: Probable small areas of acute infarct in the right parietal cortex. Otherwise negative. Mild image quality degradation due to motion. Electronically Signed   By: Franchot Gallo M.D.   On: 11/23/2015 16:44   US Carotid Bilateral  11/23/2015  CLINICAL DATA:  Hemoptysis. Slurred speech for 2 days. History of hypertension, TIA, syncope. EXAM: BILATERAL CAROTID DUPLEX ULTRASOUND TECHNIQUE: Pearline Cables scale imaging, color Doppler and duplex ultrasound were performed of bilateral carotid and vertebral arteries in the neck. COMPARISON:  None. FINDINGS: Criteria: Quantification of carotid stenosis is based on velocity parameters that correlate the residual internal carotid diameter with NASCET-based stenosis levels, using the diameter of the distal internal carotid lumen as the denominator for stenosis measurement. The following velocity measurements were obtained: RIGHT ICA:  94/31 cm/sec CCA:  [id number] cm/sec SYSTOLIC ICA/CCA RATIO:  0.9 DIASTOLIC ICA/CCA RATIO:  1 ECA:  63 cm/sec LEFT ICA:  86/39 cm/sec CCA:  [id number] cm/sec SYSTOLIC ICA/CCA RATIO:  0.8 DIASTOLIC ICA/CCA RATIO:  1.2 ECA:  87 cm/sec RIGHT CAROTID ARTERY: Mild noncalcific atherosclerotic changes in the carotid bifurcation. Color flow is demonstrated throughout the carotid bifurcation. There is a focal bulbous appearing area within the distal carotid artery with tortuous flow possibly indicating aneurysmal dilatation. No significant thrombus or wall thickening. Otherwise normal waveforms. RIGHT VERTEBRAL ARTERY:  Antegrade flow is demonstrated. LEFT CAROTID  ARTERY: Mild noncalcific atherosclerotic changes. Color flow is demonstrated throughout the carotid bifurcation. Normal waveforms. No significant thrombus or wall thickening. LEFT VERTEBRAL ARTERY:  Antegrade flow is demonstrated. IMPRESSION: No evidence of hemodynamically significant stenosis of either right or the left internal carotid artery. Nonspecific bulbous appearance of the right distal common carotid artery with tortuous blood possibly suggesting ectasia or aneurysm. No significant thrombus or wall thickening. Electronically Signed   By: Lucienne Capers M.D.   On: 11/23/2015 19:21   US Venous Img Lower Bilateral  11/23/2015  CLINICAL DATA:  Bilateral leg pain for 1 year.  History of DVTs. EXAM: BILATERAL LOWER EXTREMITY VENOUS DOPPLER ULTRASOUND TECHNIQUE: Gray-scale sonography with graded compression, as well as color Doppler and duplex ultrasound were performed to evaluate the lower extremity deep venous systems from the level of the common femoral vein and including  the common femoral, femoral, profunda femoral, popliteal and calf veins including the posterior tibial, peroneal and gastrocnemius veins when visible. The superficial great saphenous vein was also interrogated. Spectral Doppler was utilized to evaluate flow at rest and with distal augmentation maneuvers in the common femoral, femoral and popliteal veins. COMPARISON:  None. FINDINGS: RIGHT LOWER EXTREMITY Common Femoral Vein: No evidence of thrombus. Normal compressibility, respiratory phasicity and response to augmentation. Saphenofemoral Junction: No evidence of thrombus. Normal compressibility and flow on color Doppler imaging. Profunda Femoral Vein: No evidence of thrombus. Normal compressibility and flow on color Doppler imaging. Femoral Vein: No evidence of thrombus. Normal compressibility, respiratory phasicity and response to augmentation. Popliteal Vein: No evidence of thrombus. Normal compressibility, respiratory phasicity and  response to augmentation. Calf Veins: No evidence of thrombus. Normal compressibility and flow on color Doppler imaging. Superficial Great Saphenous Vein: No evidence of thrombus. Normal compressibility and flow on color Doppler imaging. Venous Reflux:  None. Other Findings:  None. LEFT LOWER EXTREMITY Common Femoral Vein: No evidence of thrombus. Normal compressibility, respiratory phasicity and response to augmentation. Saphenofemoral Junction: No evidence of thrombus. Normal compressibility and flow on color Doppler imaging. Profunda Femoral Vein: No evidence of thrombus. Normal compressibility and flow on color Doppler imaging. Femoral Vein: No evidence of thrombus. Normal compressibility, respiratory phasicity and response to augmentation. Popliteal Vein: No evidence of thrombus. Normal compressibility, respiratory phasicity and response to augmentation. Calf Veins: No evidence of thrombus. Normal compressibility and flow on color Doppler imaging. Superficial Great Saphenous Vein: No evidence of thrombus. Normal compressibility and flow on color Doppler imaging. Venous Reflux:  None. Other Findings:  None. IMPRESSION: No evidence of deep venous thrombosis. Electronically Signed   By: Lucienne Capers M.D.   On: 11/23/2015 19:15    Assessment: 55 y.o. female presenting with slurred speech.  MRI of the brain personally reviewed and shows two areas of acute infarct in the right parietal cortex.  Patient on Eliquis.  Compliant.  Carotid doppler shows no hemodynamically significant stenosis.  Concern is for PFO.  Echocardiogram pending.  May need to consider change to Coumadin.    Stroke Risk Factors - none  Plan: 1. HgbA1c, fasting lipid panel 2. Prophylactic therapy-Continue Eliquis for now 3. NPO until RN stroke swallow screen 4. Telemetry monitoring 5. Frequent neuro checks   Alexis Goodell, MD Neurology [phone] 11/24/2015, 10:29 AM

## 2015-11-24 NOTE — Evaluation (Signed)
Physical Therapy Evaluation Patient Details Name: Lindsay Cooke MRN: [id number] DOB: [birth year] Today's Date: 11/24/2015   History of Present Illness  presented to ER with slurred speech, AMS; admitted with acute CVA (MRI significant for acute R parietal infarct).  CT chest negative for PE, LE doppler negative for DVT.  Per neurology, concenred about possible PFO; discussing further testing with cardiology.  Clinical Impression  Upon evaluation, patient alert and oriented; follows all commands and demonstrates fair insight/safety awareness.  Reports persistent headache, but denies pain otherwise.  Bilat UE/LE strength, sensation and coordination grossly symmetrical and WFL; no drift, no focal weakness appreciated.  No significant speech deficits noted during session.  Able to complete bed mobility indep; sit/stand, basic transfers and gait (50-175') without assist device, min assist, with RW, cga.  Do recommend continued use of RW for optimal safety/indep at this time due to higher level balance deficits and associated gait instability.  Patient voiced understanding and agreement. Would benefit from skilled PT to address above deficits and promote optimal return to PLOF; Recommend transition to [place] upon discharge from acute hospitalization.     Follow Up Recommendations Home health PT    Equipment Recommendations  Rolling walker with 5" wheels    Recommendations for Other Services       Precautions / Restrictions Precautions Precautions: Fall Restrictions Weight Bearing Restrictions: No      Mobility  Bed Mobility Overal bed mobility: Modified Independent                Transfers Overall transfer level: Needs assistance Equipment used: Rolling walker (2 wheeled) Transfers: Sit to/from Stand Sit to Stand: Supervision;Min guard            Ambulation/Gait Ambulation/Gait assistance: Min assist Ambulation Distance (Feet): 50 Feet Assistive device: None        General Gait Details: mild/moderate ataxia with increased stance time R LE; excessive bilat lateral sway with gait efforts, occasionally reaching for walls/furniture for external stabilization  Stairs            Wheelchair Mobility    Modified Rankin (Stroke Patients Only)       Balance Overall balance assessment: Needs assistance Sitting-balance support: No upper extremity supported;Feet supported Sitting balance-Leahy Scale: Good     Standing balance support: No upper extremity supported Standing balance-Leahy Scale: Fair                               Pertinent Vitals/Pain Pain Assessment: No/denies pain    Home Living Family/patient expects to be discharged to:: Private residence Living Arrangements: Children;Other relatives (sister) Available Help at Discharge: Family;Available 24 hours/day Type of Home: House Home Access: Stairs to enter Entrance Stairs-Rails: Right Entrance Stairs-Number of Steps: 5 Home Layout: One level Home Equipment: None      Prior Function Level of Independence: Independent         Comments: Indep with ADLs and household activities; limited community mobility.  Denies fall history.  No driving; son assists with community needs/errands as able.     Hand Dominance        Extremity/Trunk Assessment   Upper Extremity Assessment: Overall WFL for tasks assessed           Lower Extremity Assessment: Overall WFL for tasks assessed (grossly at least 4/5 throughout; no focal weakness.  Denies sensory deficit.)         Communication   Communication: No difficulties  Cognition  Arousal/Alertness: Awake/alert Behavior During Therapy: WFL for tasks assessed/performed Overall Cognitive Status: Within Functional Limits for tasks assessed                      General Comments      Exercises Other Exercises Other Exercises: Gait [phone]' with RW, cga--improved symmetry, fluidity and overall safety with use  of RW; recommend continued use with all mobility at this time      Assessment/Plan    PT Assessment Patient needs continued PT services  PT Diagnosis Generalized weakness;Difficulty walking   PT Problem List Decreased activity tolerance;Decreased balance;Decreased mobility;Decreased coordination;Cardiopulmonary status limiting activity;Obesity  PT Treatment Interventions DME instruction;Gait training;Stair training;Functional mobility training;Therapeutic activities;Therapeutic exercise;Balance training;Neuromuscular re-education;Patient/family education   PT Goals (Current goals can be found in the Care Plan section) Acute Rehab PT Goals Patient Stated Goal: to return home PT Goal Formulation: With patient/family Time For Goal Achievement: 12/08/15 Potential to Achieve Goals: Good    Frequency 7X/week   Barriers to discharge        Co-evaluation               End of Session Equipment Utilized During Treatment: Gait belt Activity Tolerance: Patient tolerated treatment well Patient left: in bed;with call bell/phone within reach;with bed alarm set           Time: 1432-1451 PT Time Calculation (min) (ACUTE ONLY): 19 min   Charges:   PT Evaluation $PT Eval Moderate Complexity: 1 Procedure PT Treatments $Gait Training: 8-22 mins   PT G Codes:       Rishit Burkhalter H. Owens Shark, PT, DPT, NCS 11/24/2015, 10:30 PM [phone]

## 2015-11-25 LAB — URINALYSIS COMPLETE WITH MICROSCOPIC (ARMC ONLY)
BILIRUBIN URINE: NEGATIVE
Glucose, UA: NEGATIVE mg/dL
HGB URINE DIPSTICK: NEGATIVE
KETONES UR: NEGATIVE mg/dL
LEUKOCYTES UA: NEGATIVE
Nitrite: NEGATIVE
PH: 6 (ref 5.0–8.0)
PROTEIN: NEGATIVE mg/dL
Specific Gravity, Urine: 1.013 (ref 1.005–1.030)

## 2015-11-25 LAB — CBC
HCT: 35.4 % (ref 35.0–47.0)
HEMOGLOBIN: 12.2 g/dL (ref 12.0–16.0)
MCH: 28.6 pg (ref 26.0–34.0)
MCHC: 34.5 g/dL (ref 32.0–36.0)
MCV: 83 fL (ref 80.0–100.0)
PLATELETS: 173 10*3/uL (ref 150–440)
RBC: 4.27 MIL/uL (ref 3.80–5.20)
RDW: 16.9 % — ABNORMAL HIGH (ref 11.5–14.5)
WBC: 9.2 10*3/uL (ref 3.6–11.0)

## 2015-11-25 LAB — GLUCOSE, CAPILLARY: GLUCOSE-CAPILLARY: 118 mg/dL — AB (ref 65–99)

## 2015-11-25 LAB — LIPID PANEL
CHOL/HDL RATIO: 2.8 ratio
Cholesterol: 184 mg/dL (ref 0–200)
HDL: 66 mg/dL (ref >40)
LDL Cholesterol: 108 mg/dL — ABNORMAL HIGH (ref 0–99)
Triglycerides: 49 mg/dL (ref <150)
VLDL: 10 mg/dL (ref 0–40)

## 2015-11-25 LAB — HEMOGLOBIN A1C: Hgb A1c MFr Bld: 5.4 % (ref 4.0–6.0)

## 2015-11-25 LAB — PROTIME-INR
INR: 1.23
PROTHROMBIN TIME: 15.7 s — AB (ref 11.4–15.0)

## 2015-11-25 MED ORDER — WARFARIN SODIUM 7.5 MG PO TABS: 7.5000 mg | ORAL_TABLET | Freq: Once | ORAL | Status: DC

## 2015-11-25 MED ORDER — AZITHROMYCIN 250 MG PO TABS: 250.0000 mg | ORAL_TABLET | Freq: Every day | ORAL | Status: AC

## 2015-11-25 MED ORDER — GUAIFENESIN 100 MG/5ML PO SOLN: 5.0000 mL | Freq: Four times a day (QID) | ORAL | Status: DC | PRN

## 2015-11-25 MED ORDER — ENOXAPARIN SODIUM 150 MG/ML ~~LOC~~ SOLN: 1.0000 mg/kg | Freq: Two times a day (BID) | SUBCUTANEOUS | Status: DC

## 2015-11-25 MED ORDER — ENOXAPARIN SODIUM 150 MG/ML ~~LOC~~ SOLN
1.0000 mg/kg | Freq: Two times a day (BID) | SUBCUTANEOUS | Status: DC
  Administered 2015-11-25: 130 mg via SUBCUTANEOUS
  Filled 2015-11-25 (×2): qty 0.86

## 2015-11-25 MED ORDER — ATORVASTATIN CALCIUM 40 MG PO TABS: 40.0000 mg | ORAL_TABLET | Freq: Every day | ORAL | Status: DC

## 2015-11-25 MED ORDER — WARFARIN - PHARMACIST DOSING INPATIENT: Freq: Every day | Status: DC

## 2015-11-25 MED ORDER — AZITHROMYCIN 250 MG PO TABS
250.0000 mg | ORAL_TABLET | Freq: Every day | ORAL | Status: DC
  Administered 2015-11-25: 250 mg via ORAL
  Filled 2015-11-25: qty 1

## 2015-11-25 MED ORDER — HYDROCODONE-ACETAMINOPHEN 5-325 MG PO TABS: 1.0000 | ORAL_TABLET | Freq: Four times a day (QID) | ORAL | Status: DC | PRN

## 2015-11-25 MED ORDER — WARFARIN SODIUM 5 MG PO TABS: 5.0000 mg | ORAL_TABLET | Freq: Every day | ORAL | Status: DC

## 2015-11-25 MED ORDER — ATORVASTATIN CALCIUM 20 MG PO TABS: 40.0000 mg | ORAL_TABLET | Freq: Every day | ORAL | Status: DC

## 2015-11-25 MED ORDER — WARFARIN SODIUM 7.5 MG PO TABS
7.5000 mg | ORAL_TABLET | Freq: Every day | ORAL | Status: AC
  Administered 2015-11-25: 7.5 mg via ORAL
  Filled 2015-11-25: qty 1

## 2015-11-25 NOTE — Care Management (Addendum)
Admitted to [facility] with the diagnosis of CVA. Lives with daughter, Leary Roca [phone] or [phone]). Last was seen at [facility] 2 weeks ago. No Home Health in the past. No skilled facility. No home oxygen. Uses no aids for ambulation. Takes care of all basic activities of daily living herself, doesn't drive. Daughter helps with errands. No falls. Appetite is O.K. No seizure activity.  Physical therapy evaluation completed. Recommends home with home health/physical therapy. Will need rolling walker.  Discussed agencies. Would like [facility]. Daughter will transport. Shelbie Ammons RN MSN CCM Care Management [phone]

## 2015-11-25 NOTE — Progress Notes (Signed)
[facility] NAME: Lindsay Cooke    MR#:  [id number]  DATE OF BIRTH:  1961-06-24  SUBJECTIVE:  CHIEF COMPLAINT:   Chief Complaint  Patient presents with  . Leg Pain    Possible blood clot    Already have DVT and on eliquis, compliant- came with speech problem- found to have a new stroke.  REVIEW OF SYSTEMS:  CONSTITUTIONAL: No fever, fatigue or weakness.  EYES: No blurred or double vision.  EARS, NOSE, AND THROAT: No tinnitus or ear pain.  RESPIRATORY: No cough, shortness of breath, wheezing or hemoptysis.  CARDIOVASCULAR: No chest pain, orthopnea, edema.  GASTROINTESTINAL: No nausea, vomiting, diarrhea or abdominal pain.  GENITOURINARY: No dysuria, hematuria.  ENDOCRINE: No polyuria, nocturia,  HEMATOLOGY: No anemia, easy bruising or bleeding SKIN: No rash or lesion. MUSCULOSKELETAL: No joint pain or arthritis.   NEUROLOGIC: No tingling, numbness, weakness.  PSYCHIATRY: No anxiety or depression.   ROS  DRUG ALLERGIES:   Allergies  Allergen Reactions  . Penicillins Hives and Rash    Has patient had a PCN reaction causing immediate rash, facial/tongue/throat swelling, SOB or lightheadedness with hypotension: Yes Has patient had a PCN reaction causing severe rash involving mucus membranes or skin necrosis: No Has patient had a PCN reaction that required hospitalization No Has patient had a PCN reaction occurring within the last 10 years: No If all of the above answers are "NO", then may proceed with Cephalosporin use.    VITALS:  Blood pressure 127/75, pulse 88, temperature 100 F (37.8 C), temperature source Oral, resp. rate 18, height 5\' 1"  (1.549 m), weight 128.413 kg (283 lb 1.6 oz), SpO2 98 %.  PHYSICAL EXAMINATION:  GENERAL:  55 y.o.-year-old patient lying in the bed with no acute distress.  EYES: Pupils equal, round, reactive to light and accommodation. No scleral icterus. Extraocular muscles intact.   HEENT: Head atraumatic, normocephalic. Oropharynx and nasopharynx clear.  NECK:  Supple, no jugular venous distention. No thyroid enlargement, no tenderness.  LUNGS: Normal breath sounds bilaterally, no wheezing, rales,rhonchi or crepitation. No use of accessory muscles of respiration.  CARDIOVASCULAR: S1, S2 normal. No murmurs, rubs, or gallops.  ABDOMEN: Soft, nontender, nondistended. Bowel sounds present. No organomegaly or mass.  EXTREMITIES: No pedal edema, cyanosis, or clubbing.  NEUROLOGIC: Cranial nerves II through XII are intact. Muscle strength 5/5 in all extremities. Sensation intact. Gait not checked. Speech is slightly stuttered, takes few seconds to think of some words. PSYCHIATRIC: The patient is alert and oriented x 3.  SKIN: No obvious rash, lesion, or ulcer.   Physical Exam LABORATORY PANEL:   CBC  Recent Labs Lab 11/24/15 0415  WBC 8.2  HGB 11.7*  HCT 32.8*  PLT 175   ------------------------------------------------------------------------------------------------------------------  Chemistries   Recent Labs Lab 11/24/15 0415  NA 139  K 3.7  CL 108  CO2 26  GLUCOSE 108*  BUN 14  CREATININE 0.79  CALCIUM 8.7*  AST 19  ALT 21  ALKPHOS 57  BILITOT 0.8   ------------------------------------------------------------------------------------------------------------------  Cardiac Enzymes  Recent Labs Lab 11/23/15 1226 11/24/15 0415  TROPONINI <0.03 <0.03   ------------------------------------------------------------------------------------------------------------------  RADIOLOGY:  Ct Head Wo Contrast  11/23/2015  CLINICAL DATA:  Slurred speech. EXAM: CT HEAD WITHOUT CONTRAST TECHNIQUE: Contiguous axial images were obtained from the base of the skull through the vertex without intravenous contrast. COMPARISON:  05/29/2014 head CT. FINDINGS: The anterior temporal fossa is poorly visualized due to extensive streak artifact from the  skull base. No  evidence of parenchymal hemorrhage or extra-axial fluid collection. No mass lesion, mass effect, or midline shift. No CT evidence of acute infarct. Cerebral volume is age appropriate. No ventriculomegaly. The visualized paranasal sinuses are essentially clear. The mastoid air cells are unopacified. No evidence of calvarial fracture. IMPRESSION: Negative head CT.  No acute intracranial abnormality. Electronically Signed   By: Ilona Sorrel M.D.   On: 11/23/2015 13:42   Ct Angio Chest Pe W/cm &/or Wo Cm  11/23/2015  CLINICAL DATA:  Hemoptysis 2 days ago. History of blood clots. Anti coagulation with Eliquis. EXAM: CT ANGIOGRAPHY CHEST WITH CONTRAST TECHNIQUE: Multidetector CT imaging of the chest was performed using the standard protocol during bolus administration of intravenous contrast. Multiplanar CT image reconstructions and MIPs were obtained to evaluate the vascular anatomy. CONTRAST:  23mL OMNIPAQUE IOHEXOL 350 MG/ML SOLN COMPARISON:  Multiple exams, including 02/12/2015 and 06/08/2008 FINDINGS: Body habitus reduces diagnostic sensitivity and specificity. Mediastinum/Nodes: No filling defect is identified in the pulmonary arterial tree to suggest pulmonary embolus. Mild cardiomegaly. Lungs/Pleura: Linear high density branching structures in the right middle lobe anteriorly on images 68-74 of series 7 and similar structures in the right lower lobe noted. Similar linear high density structures are present in the posterior basal segments of the lower lobes. The lungs appear otherwise clear. Upper abdomen: Unremarkable Musculoskeletal: Lower thoracic spondylosis. Review of the MIP images confirms the above findings. IMPRESSION: 1. No filling defect is identified in the pulmonary arterial tree to suggest pulmonary embolus. 2. There is some tiny linear branching densities in the periphery of the right middle lobe and seen to a lesser extent in the lower lobes and lingula. Differential diagnostic considerations  include small broncho let us along bronchiectatic peripheral airways related to prior atypical infectious process, old granulomatous disease, or possibly remote and likely clinically inconsequential methacrylate embolization, if the patient has had of vertebral augmentation or implant. In any case these branching densities are not appreciably changed from 2014. 3. Lower thoracic spondylosis. Electronically Signed   By: Van Clines M.D.   On: 11/23/2015 18:18   Mr Brain Wo Contrast  11/23/2015  CLINICAL DATA:  Slurred speech.  Eye swelling.  Hemoptysis. EXAM: MRI HEAD WITHOUT CONTRAST TECHNIQUE: Multiplanar, multiecho pulse sequences of the brain and surrounding structures were obtained without intravenous contrast. COMPARISON:  CT head 11/23/2015 FINDINGS: Two small areas of possible restricted diffusion in the right parietal cortex suggestive of small acute infarcts. These measure under 5 mm each. No other areas of restricted diffusion. Negative for chronic ischemia. Negative for demyelinating disease. Cerebral white matter normal Ventricle size normal.  Cerebral volume normal. Negative for hemorrhage or mass. No shift of the midline structures. Image quality degraded by mild motion Paranasal sinuses clear.  Pituitary normal in size.  Normal orbit. IMPRESSION: Probable small areas of acute infarct in the right parietal cortex. Otherwise negative. Mild image quality degradation due to motion. Electronically Signed   By: Franchot Gallo M.D.   On: 11/23/2015 16:44   US Carotid Bilateral  11/23/2015  CLINICAL DATA:  Hemoptysis. Slurred speech for 2 days. History of hypertension, TIA, syncope. EXAM: BILATERAL CAROTID DUPLEX ULTRASOUND TECHNIQUE: Pearline Cables scale imaging, color Doppler and duplex ultrasound were performed of bilateral carotid and vertebral arteries in the neck. COMPARISON:  None. FINDINGS: Criteria: Quantification of carotid stenosis is based on velocity parameters that correlate the residual  internal carotid diameter with NASCET-based stenosis levels, using the diameter of the distal internal carotid lumen as  the denominator for stenosis measurement. The following velocity measurements were obtained: RIGHT ICA:  94/31 cm/sec CCA:  [id number] cm/sec SYSTOLIC ICA/CCA RATIO:  0.9 DIASTOLIC ICA/CCA RATIO:  1 ECA:  63 cm/sec LEFT ICA:  86/39 cm/sec CCA:  [id number] cm/sec SYSTOLIC ICA/CCA RATIO:  0.8 DIASTOLIC ICA/CCA RATIO:  1.2 ECA:  87 cm/sec RIGHT CAROTID ARTERY: Mild noncalcific atherosclerotic changes in the carotid bifurcation. Color flow is demonstrated throughout the carotid bifurcation. There is a focal bulbous appearing area within the distal carotid artery with tortuous flow possibly indicating aneurysmal dilatation. No significant thrombus or wall thickening. Otherwise normal waveforms. RIGHT VERTEBRAL ARTERY:  Antegrade flow is demonstrated. LEFT CAROTID ARTERY: Mild noncalcific atherosclerotic changes. Color flow is demonstrated throughout the carotid bifurcation. Normal waveforms. No significant thrombus or wall thickening. LEFT VERTEBRAL ARTERY:  Antegrade flow is demonstrated. IMPRESSION: No evidence of hemodynamically significant stenosis of either right or the left internal carotid artery. Nonspecific bulbous appearance of the right distal common carotid artery with tortuous blood possibly suggesting ectasia or aneurysm. No significant thrombus or wall thickening. Electronically Signed   By: Lucienne Capers M.D.   On: 11/23/2015 19:21   US Venous Img Lower Bilateral  11/23/2015  CLINICAL DATA:  Bilateral leg pain for 1 year.  History of DVTs. EXAM: BILATERAL LOWER EXTREMITY VENOUS DOPPLER ULTRASOUND TECHNIQUE: Gray-scale sonography with graded compression, as well as color Doppler and duplex ultrasound were performed to evaluate the lower extremity deep venous systems from the level of the common femoral vein and including the common femoral, femoral, profunda femoral, popliteal and calf  veins including the posterior tibial, peroneal and gastrocnemius veins when visible. The superficial great saphenous vein was also interrogated. Spectral Doppler was utilized to evaluate flow at rest and with distal augmentation maneuvers in the common femoral, femoral and popliteal veins. COMPARISON:  None. FINDINGS: RIGHT LOWER EXTREMITY Common Femoral Vein: No evidence of thrombus. Normal compressibility, respiratory phasicity and response to augmentation. Saphenofemoral Junction: No evidence of thrombus. Normal compressibility and flow on color Doppler imaging. Profunda Femoral Vein: No evidence of thrombus. Normal compressibility and flow on color Doppler imaging. Femoral Vein: No evidence of thrombus. Normal compressibility, respiratory phasicity and response to augmentation. Popliteal Vein: No evidence of thrombus. Normal compressibility, respiratory phasicity and response to augmentation. Calf Veins: No evidence of thrombus. Normal compressibility and flow on color Doppler imaging. Superficial Great Saphenous Vein: No evidence of thrombus. Normal compressibility and flow on color Doppler imaging. Venous Reflux:  None. Other Findings:  None. LEFT LOWER EXTREMITY Common Femoral Vein: No evidence of thrombus. Normal compressibility, respiratory phasicity and response to augmentation. Saphenofemoral Junction: No evidence of thrombus. Normal compressibility and flow on color Doppler imaging. Profunda Femoral Vein: No evidence of thrombus. Normal compressibility and flow on color Doppler imaging. Femoral Vein: No evidence of thrombus. Normal compressibility, respiratory phasicity and response to augmentation. Popliteal Vein: No evidence of thrombus. Normal compressibility, respiratory phasicity and response to augmentation. Calf Veins: No evidence of thrombus. Normal compressibility and flow on color Doppler imaging. Superficial Great Saphenous Vein: No evidence of thrombus. Normal compressibility and flow on  color Doppler imaging. Venous Reflux:  None. Other Findings:  None. IMPRESSION: No evidence of deep venous thrombosis. Electronically Signed   By: Lucienne Capers M.D.   On: 11/23/2015 19:15    ASSESSMENT AND PLAN:   Principal Problem:   CVA (cerebral vascular accident) ([place]) Active Problems:   OBESITY   Hemoptysis   DVT, lower extremity, recurrent ([place])  * CVA  Confirmned by MRI.    Was on Eliquis, may need to change per neurology.   Lipid panel, ECho, carotid doppler.  * Hypertension    Cont Losartan  * Headache   Due to stroke   Give fioricet  * DVT   Already on Eliquis for one year.   Now have stroke.   Will switch to coumadin, and refer to hematology as out pt.    All the records are reviewed and case discussed with Care Management/Social Workerr. Management plans discussed with the patient, family and they are in agreement.  CODE STATUS: Full  TOTAL TIME TAKING CARE OF THIS PATIENT: 35 minutes.   Need to have swallow eval by nurse, and recommendation by Neuro.  POSSIBLE D/C IN 1-2 DAYS, DEPENDING ON CLINICAL CONDITION.   Vaughan Basta M.D on 11/25/2015   Between 7am to 6pm - Pager - [phone]  After 6pm go to [email or web address] - password [email or web address] [facility]  [facility]  Office  [phone]  CC: Primary care physician; No primary care provider on file.  Note: This dictation was prepared with Dragon dictation along with smaller phrase technology. Any transcriptional errors that result from this process are unintentional.

## 2015-11-25 NOTE — Progress Notes (Signed)
ANTICOAGULATION CONSULT NOTE - Initial Consult  Pharmacy Consult for Enoxaparin/warfarin dosing Indication: atrial fibrillation  Allergies  Allergen Reactions  . Penicillins Hives and Rash    Has patient had a PCN reaction causing immediate rash, facial/tongue/throat swelling, SOB or lightheadedness with hypotension: Yes Has patient had a PCN reaction causing severe rash involving mucus membranes or skin necrosis: No Has patient had a PCN reaction that required hospitalization No Has patient had a PCN reaction occurring within the last 10 years: No If all of the above answers are "NO", then may proceed with Cephalosporin use.    Patient Measurements: Height: 5\' 1"  (154.9 cm) Weight: 283 lb 1.6 oz (128.413 kg) IBW/kg (Calculated) : 47.8  Vital Signs: Temp: 100 F (37.8 C) (03/21 0347) Temp Source: Oral (03/21 0347) BP: 127/75 mmHg (03/21 0347) Pulse Rate: 88 (03/21 0347)  Labs:  Recent Labs  11/23/15 1226 11/24/15 0415 11/25/15 0502  HGB 12.1 11.7* 12.2  HCT 35.7 32.8* 35.4  PLT 193 175 173  CREATININE 0.78 0.79  --   TROPONINI <0.03 <0.03  --     Estimated Creatinine Clearance: 103.9 mL/min (by C-G formula based on Cr of 0.79).   Medical History: Past Medical History  Diagnosis Date  . Reflux   . Benign essential hypertension   . Right leg DVT ([place])   . Obesity   . Lumbar spondylosis   . Anemia   . Asthma     Medications:  Scheduled:  . aspirin EC  81 mg Oral Daily  . docusate sodium  100 mg Oral BID  . DULoxetine  60 mg Oral BID  . enoxaparin (LOVENOX) injection  1 mg/kg Subcutaneous Q12H  . hydrALAZINE  25 mg Oral Daily  . losartan  100 mg Oral Daily   And  . hydrochlorothiazide  25 mg Oral Daily  . pantoprazole  40 mg Oral Daily  . sodium chloride flush  3 mL Intravenous Q12H  . vitamin E  400 Units Oral Daily  . warfarin  7.5 mg Oral Daily  . Warfarin - Pharmacist Dosing Inpatient   Does not apply q1800    Assessment: Pharmacy consulted to  dose enoxaparin and warfarin in a 55 yo female admitted with stroke.  Patient previously on apixaban 5 mg po BID for prior DVT. Per notes, transition patient to warfarin.   Baseline INR pending but expect this value to be elevated due to recent apixaban use.  Last dose of apixaban was at 2023 on 3/20.    Goal of Therapy:  INR 2-3 Monitor CBC and SCr per policy   Plan:  Will order Lovenox 1 mg/kg subq q12h (130 mg subq q12h) and warfarin 7.5 mg po once to start today at 1000 (~12 hours after last apixaban dose).  Will order daily INR. Please note that apixaban affects the INR and measuring the INR during coadministration with warfarin therapy may not be useful for determining an appropriate dose of warfarin.  Pharmacy will continue to follow   Mandrell Vangilder G 11/25/2015,8:43 AM

## 2015-11-25 NOTE — Progress Notes (Addendum)
Physical Therapy Treatment Patient Details Name: Lindsay Cooke MRN: [id number] DOB: Oct 15, 1960 Today's Date: 11/25/2015    History of Present Illness presented to ER with slurred speech, AMS; admitted with acute CVA (MRI significant for acute R parietal infarct).  CT chest negative for PE, LE doppler negative for DVT.  Per neurology, concenred about possible PFO; discussing further testing with cardiology.    PT Comments    Treatment attempt initially and nursing administering medication. Second attempt; pt agreeable and ready for PT. Pt complains of constant headache and some fatigue. Pt/family educated on acute phase post CVA and benefit of rest as well as music therapy. Pt demonstrates progress with ambulation distance and quality with rolling walker as well as stair training. Ambulates 100 ft and performs up/down 6 steps. Requires wheelchair transport back to room post due to fatigue. Pt noted to have Right lower extremity weakness with stand exercises particularly with single upper extremity support versus bilateral upper extremity support. Fatigues with stand exercises and audible shortness of breath. Pt currently has discharge orders home with home health PT follow up. Complete current PT orders.   Follow Up Recommendations  Home health PT     Equipment Recommendations  Rolling walker with 5" wheels    Recommendations for Other Services       Precautions / Restrictions Precautions Precautions: Fall Restrictions Weight Bearing Restrictions: No    Mobility  Bed Mobility Overal bed mobility: Modified Independent             General bed mobility comments: use of rail  Transfers Overall transfer level: Modified independent Equipment used: Rolling walker (2 wheeled) Transfers: Sit to/from Stand Sit to Stand: Modified independent (Device/Increase time)         General transfer comment: Safe use of hand placement  Ambulation/Gait Ambulation/Gait assistance:  Supervision Ambulation Distance (Feet): 100 Feet Assistive device: Rolling walker (2 wheeled) Gait Pattern/deviations: Step-through pattern;WFL(Within Functional Limits);Trunk flexed Gait velocity: decreased Gait velocity interpretation: Below normal speed for age/gender General Gait Details: Subjective weakness on R, but not LOB or overt signs of weakness   Stairs Stairs: Yes Stairs assistance: Min guard Stair Management: One rail Right;Step to pattern;Forwards;Sideways Number of Stairs: 6 General stair comments: Up forward with 1 rail R step to; down sideways with BUE 1 rail on L sideways. Cautious, no LOB, knee buckling  Wheelchair Mobility    Modified Rankin (Stroke Patients Only)       Balance Overall balance assessment: Needs assistance Sitting-balance support: Feet supported Sitting balance-Leahy Scale: Good     Standing balance support: Single extremity supported Standing balance-Leahy Scale: Fair Standing balance comment: Increased deficit noted (R side weakness) from single UE support as opposed to BUE support                    Cognition Arousal/Alertness: Awake/alert Behavior During Therapy: WFL for tasks assessed/performed Overall Cognitive Status: Within Functional Limits for tasks assessed                      Exercises General Exercises - Lower Extremity Hip ABduction/ADduction: Strengthening;Both;10 reps;Standing (BUE support) Straight Leg Raises: Strengthening;Both;10 reps;Standing (Single UE support) Hip Flexion/Marching: Strengthening;Both;10 reps;Standing (Single UE support) Other Exercises Other Exercises: B hip extension 10x BUE support Other Exercises: Pt family educated on importance of listening to fatigue/need for rest and music therapy in first 7 days (acute phase) post stroke.     General Comments        Pertinent  Vitals/Pain Pain Assessment: 0-10 Pain Score: 7  Pain Location: Headache Pain Descriptors / Indicators:  Constant    Home Living                      Prior Function            PT Goals (current goals can now be found in the care plan section) Progress towards PT goals: Progressing toward goals    Frequency  7X/week    PT Plan Current plan remains appropriate    Co-evaluation             End of Session Equipment Utilized During Treatment: Gait belt Activity Tolerance: Patient limited by fatigue Patient left: in bed;with call bell/phone within reach;with bed alarm set;with family/visitor present     Time: [id number] PT Time Calculation (min) (ACUTE ONLY): 29 min  Charges:  $Gait Training: 8-22 mins $Therapeutic Exercise: 8-22 mins                    G Codes:      Charlaine Dalton, PTA 11/25/2015, 11:50 AM

## 2015-11-25 NOTE — Care Management (Signed)
Lovenox 150mg /mc is available at [facility] on [address]. Cost will be $1.20. Lovenox is not available at the [facility] on [address]. Dr. Anselm Jungling updated. Ms. Truong updated. Shelbie Ammons RN MSN CCM Care management [phone]

## 2015-11-25 NOTE — Progress Notes (Signed)
Patient discharged home with family via wheelchair by volunteer services. Madlyn Frankel, RN

## 2015-11-25 NOTE — Progress Notes (Signed)
Patient and son educated regarding Lovenox injections. Demonstrated injection and provided starter kit - provided by pharmacy. All questions answered. Madlyn Frankel, RN

## 2015-11-25 NOTE — Progress Notes (Signed)
Discharge instructions given and went over with patient at bedside. All questions answered. Prescriptions given. Patient to be discharged home; awaiting transportation. Madlyn Frankel, RN

## 2015-11-25 NOTE — Care Management Important Message (Signed)
Important Message  Patient Details  Name: Lindsay Cooke MRN: [id number] Date of Birth: 1961-06-01   Medicare Important Message Given:  Yes    Juliann Pulse A Qianna Clagett 11/25/2015, 10:40 AM

## 2015-11-25 NOTE — Discharge Instructions (Signed)
Need to check INR in next 3-4 days and adjust coumadin dose as per PMD.

## 2015-11-25 NOTE — Discharge Summary (Signed)
[facility] NAME: Lindsay Cooke    MR#:  [id number]  DATE OF BIRTH:  10/23/1960  DATE OF ADMISSION:  11/23/2015 ADMITTING PHYSICIAN: Idelle Crouch, MD  DATE OF DISCHARGE: 11/25/2015  PRIMARY CARE PHYSICIAN: No primary care provider on file.    ADMISSION DIAGNOSIS:  Slurred speech [R47.81] DVT (deep venous thrombosis) (HCC) [I82.409] Bilateral leg pain [M79.604, M79.605] History of DVT (deep vein thrombosis) [Z86.718] Hemoptysis [R04.2] Cerebral infarction due to unspecified mechanism [I63.9]  DISCHARGE DIAGNOSIS:  Principal Problem:   CVA (cerebral vascular accident) ([place]) Active Problems:   OBESITY   Hemoptysis   DVT, lower extremity, recurrent ([place])   SECONDARY DIAGNOSIS:   Past Medical History  Diagnosis Date  . Reflux   . Benign essential hypertension   . Right leg DVT ([place])   . Obesity   . Lumbar spondylosis   . Anemia   . Asthma     HOSPITAL COURSE:   * CVA  Confirmned by MRI.  Was on Eliquis, change per neurology to coumadin.  Lipid panel, ECho, carotid doppler.   Given 5 day of lovenox [place] for bridging, and advised to check INR in 3-4 days with PMD.  * Hypertension  Cont Losartan  * Headache  Due to stroke  Give fioricet  * DVT  Already on Eliquis for one year.  Now have stroke.  Will switch to coumadin, and refer to hematology as out pt.   DISCHARGE CONDITIONS:   Stable.  CONSULTS OBTAINED:  Treatment Team:  Alexis Goodell, MD  DRUG ALLERGIES:   Allergies  Allergen Reactions  . Penicillins Hives and Rash    Has patient had a PCN reaction causing immediate rash, facial/tongue/throat swelling, SOB or lightheadedness with hypotension: Yes Has patient had a PCN reaction causing severe rash involving mucus membranes or skin necrosis: No Has patient had a PCN reaction that required hospitalization No Has patient had a PCN reaction occurring within the last 10  years: No If all of the above answers are "NO", then may proceed with Cephalosporin use.    DISCHARGE MEDICATIONS:   Current Discharge Medication List    START taking these medications   Details  atorvastatin (LIPITOR) 40 MG tablet Take 1 tablet (40 mg total) by mouth daily at 6 PM. Qty: 30 tablet, Refills: 0    azithromycin (ZITHROMAX) 250 MG tablet Take 1 tablet (250 mg total) by mouth daily. Qty: 4 each, Refills: 0    enoxaparin (LOVENOX) 150 MG/ML injection Inject 0.86 mLs (130 mg total) into the skin every 12 (twelve) hours. Qty: 10 Syringe, Refills: 0    guaiFENesin (ROBITUSSIN) 100 MG/5ML SOLN Take 5 mLs (100 mg total) by mouth every 6 (six) hours as needed for cough or to loosen phlegm. Qty: 1200 mL, Refills: 0    HYDROcodone-acetaminophen (NORCO/VICODIN) 5-325 MG tablet Take 1 tablet by mouth every 6 (six) hours as needed for moderate pain. Qty: 12 tablet, Refills: 0    warfarin (COUMADIN) 5 MG tablet Take 1 tablet (5 mg total) by mouth daily at 6 PM. Qty: 15 tablet, Refills: 0      CONTINUE these medications which have NOT CHANGED   Details  albuterol (PROVENTIL HFA;VENTOLIN HFA) 108 (90 BASE) MCG/ACT inhaler Inhale 2 puffs into the lungs every 6 (six) hours as needed for wheezing or shortness of breath. Qty: 1 Inhaler, Refills: 2    Apple Cider Vinegar (APPLE CIDER VINEGAR ULTRA) 600 MG CAPS Take 1  capsule by mouth daily.    cetirizine (ZYRTEC) 10 MG tablet Take 10 mg by mouth daily.    Cholecalciferol (D3-1000 PO) Take 1,000 Units by mouth daily.     Cinnamon 500 MG capsule Take 500 mg by mouth daily.     DULoxetine (CYMBALTA) 60 MG capsule Take 60 mg by mouth 2 (two) times daily.   Associated Diagnoses: Acute deep vein thrombosis (DVT) of femoral vein of right lower extremity (HCC)    hydrALAZINE (APRESOLINE) 25 MG tablet Take 25 mg by mouth daily.    losartan-hydrochlorothiazide (HYZAAR) 100-25 MG tablet Take 1 tablet by mouth daily.    omeprazole  (PRILOSEC) 40 MG capsule Take 1 capsule by mouth daily.    PATADAY 0.2 % SOLN Place 1 drop into both eyes daily as needed. For dry and itchy eyes.    vitamin C (ASCORBIC ACID) 500 MG tablet Take 500 mg by mouth daily.    vitamin E (VITAMIN E) 400 UNIT capsule Take 400 Units by mouth daily.   Associated Diagnoses: Acute deep vein thrombosis (DVT) of femoral vein of right lower extremity (HCC)      STOP taking these medications     apixaban (ELIQUIS) 5 MG TABS tablet          DISCHARGE INSTRUCTIONS:    Follow with hematology clini cin 2-3 weeks.  If you experience worsening of your admission symptoms, develop shortness of breath, life threatening emergency, suicidal or homicidal thoughts you must seek medical attention immediately by calling 911 or calling your MD immediately  if symptoms less severe.  You Must read complete instructions/literature along with all the possible adverse reactions/side effects for all the Medicines you take and that have been prescribed to you. Take any new Medicines after you have completely understood and accept all the possible adverse reactions/side effects.   Please note  You were cared for by a hospitalist during your hospital stay. If you have any questions about your discharge medications or the care you received while you were in the hospital after you are discharged, you can call the unit and asked to speak with the hospitalist on call if the hospitalist that took care of you is not available. Once you are discharged, your primary care physician will handle any further medical issues. Please note that NO REFILLS for any discharge medications will be authorized once you are discharged, as it is imperative that you return to your primary care physician (or establish a relationship with a primary care physician if you do not have one) for your aftercare needs so that they can reassess your need for medications and monitor your lab values.    Today    CHIEF COMPLAINT:   Chief Complaint  Patient presents with  . Leg Pain    Possible blood clot    HISTORY OF PRESENT ILLNESS:  Lindsay Cooke  is a 55 y.o. female with a known history of obesity, HTN, and recurrent DVT's on Eliquis now with acute onset of slurred speech with confusion. Also has had cough and mild hemoptysis. In ER, MRI positive for acute CVA/infarct. Denies HA or blurred vision. No focal numbness or weakness. She is now admitted. Has been on Eliquis for over a year now. Last DVT was 2-3 months ago. Has never had a coagulopathy w/u.  VITAL SIGNS:  Blood pressure 124/70, pulse 94, temperature 99.8 F (37.7 C), temperature source Oral, resp. rate 18, height 5\' 1"  (1.549 m), weight 128.413 kg (283 lb 1.6 oz),  SpO2 96 %.  I/O:   Intake/Output Summary (Last 24 hours) at 11/25/15 1603 Last data filed at 11/25/15 1300  Gross per 24 hour  Intake    600 ml  Output    300 ml  Net    300 ml    PHYSICAL EXAMINATION:   GENERAL: 55 y.o.-year-old patient lying in the bed with no acute distress.  EYES: Pupils equal, round, reactive to light and accommodation. No scleral icterus. Extraocular muscles intact.  HEENT: Head atraumatic, normocephalic. Oropharynx and nasopharynx clear.  NECK: Supple, no jugular venous distention. No thyroid enlargement, no tenderness.  LUNGS: Normal breath sounds bilaterally, no wheezing, rales,rhonchi or crepitation. No use of accessory muscles of respiration.  CARDIOVASCULAR: S1, S2 normal. No murmurs, rubs, or gallops.  ABDOMEN: Soft, nontender, nondistended. Bowel sounds present. No organomegaly or mass.  EXTREMITIES: No pedal edema, cyanosis, or clubbing.  NEUROLOGIC: Cranial nerves II through XII are intact. Muscle strength 5/5 in all extremities. Sensation intact. Gait not checked. Speech is slightly stuttered, takes few seconds to think of some words. PSYCHIATRIC: The patient is alert and oriented x 3.  SKIN: No obvious rash, lesion,  or ulcer.    DATA REVIEW:   CBC  Recent Labs Lab 11/25/15 0502  WBC 9.2  HGB 12.2  HCT 35.4  PLT 173    Chemistries   Recent Labs Lab 11/24/15 0415  NA 139  K 3.7  CL 108  CO2 26  GLUCOSE 108*  BUN 14  CREATININE 0.79  CALCIUM 8.7*  AST 19  ALT 21  ALKPHOS 57  BILITOT 0.8    Cardiac Enzymes  Recent Labs Lab 11/24/15 0415  TROPONINI <0.03    Microbiology Results  No results found for this or any previous visit.  RADIOLOGY:  Ct Angio Chest Pe W/cm &/or Wo Cm  11/23/2015  CLINICAL DATA:  Hemoptysis 2 days ago. History of blood clots. Anti coagulation with Eliquis. EXAM: CT ANGIOGRAPHY CHEST WITH CONTRAST TECHNIQUE: Multidetector CT imaging of the chest was performed using the standard protocol during bolus administration of intravenous contrast. Multiplanar CT image reconstructions and MIPs were obtained to evaluate the vascular anatomy. CONTRAST:  33mL OMNIPAQUE IOHEXOL 350 MG/ML SOLN COMPARISON:  Multiple exams, including 02/12/2015 and 06/08/2008 FINDINGS: Body habitus reduces diagnostic sensitivity and specificity. Mediastinum/Nodes: No filling defect is identified in the pulmonary arterial tree to suggest pulmonary embolus. Mild cardiomegaly. Lungs/Pleura: Linear high density branching structures in the right middle lobe anteriorly on images 68-74 of series 7 and similar structures in the right lower lobe noted. Similar linear high density structures are present in the posterior basal segments of the lower lobes. The lungs appear otherwise clear. Upper abdomen: Unremarkable Musculoskeletal: Lower thoracic spondylosis. Review of the MIP images confirms the above findings. IMPRESSION: 1. No filling defect is identified in the pulmonary arterial tree to suggest pulmonary embolus. 2. There is some tiny linear branching densities in the periphery of the right middle lobe and seen to a lesser extent in the lower lobes and lingula. Differential diagnostic considerations  include small broncho let us along bronchiectatic peripheral airways related to prior atypical infectious process, old granulomatous disease, or possibly remote and likely clinically inconsequential methacrylate embolization, if the patient has had of vertebral augmentation or implant. In any case these branching densities are not appreciably changed from 2014. 3. Lower thoracic spondylosis. Electronically Signed   By: Van Clines M.D.   On: 11/23/2015 18:18   Mr Brain Wo Contrast  11/23/2015  CLINICAL  DATA:  Slurred speech.  Eye swelling.  Hemoptysis. EXAM: MRI HEAD WITHOUT CONTRAST TECHNIQUE: Multiplanar, multiecho pulse sequences of the brain and surrounding structures were obtained without intravenous contrast. COMPARISON:  CT head 11/23/2015 FINDINGS: Two small areas of possible restricted diffusion in the right parietal cortex suggestive of small acute infarcts. These measure under 5 mm each. No other areas of restricted diffusion. Negative for chronic ischemia. Negative for demyelinating disease. Cerebral white matter normal Ventricle size normal.  Cerebral volume normal. Negative for hemorrhage or mass. No shift of the midline structures. Image quality degraded by mild motion Paranasal sinuses clear.  Pituitary normal in size.  Normal orbit. IMPRESSION: Probable small areas of acute infarct in the right parietal cortex. Otherwise negative. Mild image quality degradation due to motion. Electronically Signed   By: Franchot Gallo M.D.   On: 11/23/2015 16:44   US Carotid Bilateral  11/23/2015  CLINICAL DATA:  Hemoptysis. Slurred speech for 2 days. History of hypertension, TIA, syncope. EXAM: BILATERAL CAROTID DUPLEX ULTRASOUND TECHNIQUE: Pearline Cables scale imaging, color Doppler and duplex ultrasound were performed of bilateral carotid and vertebral arteries in the neck. COMPARISON:  None. FINDINGS: Criteria: Quantification of carotid stenosis is based on velocity parameters that correlate the residual  internal carotid diameter with NASCET-based stenosis levels, using the diameter of the distal internal carotid lumen as the denominator for stenosis measurement. The following velocity measurements were obtained: RIGHT ICA:  94/31 cm/sec CCA:  [id number] cm/sec SYSTOLIC ICA/CCA RATIO:  0.9 DIASTOLIC ICA/CCA RATIO:  1 ECA:  63 cm/sec LEFT ICA:  86/39 cm/sec CCA:  [id number] cm/sec SYSTOLIC ICA/CCA RATIO:  0.8 DIASTOLIC ICA/CCA RATIO:  1.2 ECA:  87 cm/sec RIGHT CAROTID ARTERY: Mild noncalcific atherosclerotic changes in the carotid bifurcation. Color flow is demonstrated throughout the carotid bifurcation. There is a focal bulbous appearing area within the distal carotid artery with tortuous flow possibly indicating aneurysmal dilatation. No significant thrombus or wall thickening. Otherwise normal waveforms. RIGHT VERTEBRAL ARTERY:  Antegrade flow is demonstrated. LEFT CAROTID ARTERY: Mild noncalcific atherosclerotic changes. Color flow is demonstrated throughout the carotid bifurcation. Normal waveforms. No significant thrombus or wall thickening. LEFT VERTEBRAL ARTERY:  Antegrade flow is demonstrated. IMPRESSION: No evidence of hemodynamically significant stenosis of either right or the left internal carotid artery. Nonspecific bulbous appearance of the right distal common carotid artery with tortuous blood possibly suggesting ectasia or aneurysm. No significant thrombus or wall thickening. Electronically Signed   By: Lucienne Capers M.D.   On: 11/23/2015 19:21   US Venous Img Lower Bilateral  11/23/2015  CLINICAL DATA:  Bilateral leg pain for 1 year.  History of DVTs. EXAM: BILATERAL LOWER EXTREMITY VENOUS DOPPLER ULTRASOUND TECHNIQUE: Gray-scale sonography with graded compression, as well as color Doppler and duplex ultrasound were performed to evaluate the lower extremity deep venous systems from the level of the common femoral vein and including the common femoral, femoral, profunda femoral, popliteal and calf  veins including the posterior tibial, peroneal and gastrocnemius veins when visible. The superficial great saphenous vein was also interrogated. Spectral Doppler was utilized to evaluate flow at rest and with distal augmentation maneuvers in the common femoral, femoral and popliteal veins. COMPARISON:  None. FINDINGS: RIGHT LOWER EXTREMITY Common Femoral Vein: No evidence of thrombus. Normal compressibility, respiratory phasicity and response to augmentation. Saphenofemoral Junction: No evidence of thrombus. Normal compressibility and flow on color Doppler imaging. Profunda Femoral Vein: No evidence of thrombus. Normal compressibility and flow on color Doppler imaging. Femoral Vein: No evidence of thrombus. Normal  compressibility, respiratory phasicity and response to augmentation. Popliteal Vein: No evidence of thrombus. Normal compressibility, respiratory phasicity and response to augmentation. Calf Veins: No evidence of thrombus. Normal compressibility and flow on color Doppler imaging. Superficial Great Saphenous Vein: No evidence of thrombus. Normal compressibility and flow on color Doppler imaging. Venous Reflux:  None. Other Findings:  None. LEFT LOWER EXTREMITY Common Femoral Vein: No evidence of thrombus. Normal compressibility, respiratory phasicity and response to augmentation. Saphenofemoral Junction: No evidence of thrombus. Normal compressibility and flow on color Doppler imaging. Profunda Femoral Vein: No evidence of thrombus. Normal compressibility and flow on color Doppler imaging. Femoral Vein: No evidence of thrombus. Normal compressibility, respiratory phasicity and response to augmentation. Popliteal Vein: No evidence of thrombus. Normal compressibility, respiratory phasicity and response to augmentation. Calf Veins: No evidence of thrombus. Normal compressibility and flow on color Doppler imaging. Superficial Great Saphenous Vein: No evidence of thrombus. Normal compressibility and flow on  color Doppler imaging. Venous Reflux:  None. Other Findings:  None. IMPRESSION: No evidence of deep venous thrombosis. Electronically Signed   By: Lucienne Capers M.D.   On: 11/23/2015 19:15      Management plans discussed with the patient, family and they are in agreement.  CODE STATUS:     Code Status Orders        Start     Ordered   11/23/15 2049  Full code   Continuous     11/23/15 2048    Code Status History    Date Active Date Inactive Code Status Order ID Comments User Context   This patient has a current code status but no historical code status.      TOTAL TIME TAKING CARE OF THIS PATIENT: 35 minutes.    Vaughan Basta M.D on 11/25/2015 at 4:03 PM  Between 7am to 6pm - Pager - [phone]  After 6pm go to [email or web address] - password [email or web address] [facility]  [employer] [facility]  Office  [phone]  CC: Primary care physician; No primary care provider on file.   Note: This dictation was prepared with Dragon dictation along with smaller phrase technology. Any transcriptional errors that result from this process are unintentional.

## 2015-11-28 ENCOUNTER — Encounter: Payer: Self-pay | Admitting: *Deleted

## 2015-11-28 ENCOUNTER — Emergency Department
Admission: EM | Admit: 2015-11-28 | Discharge: 2015-11-29 | Disposition: A | Discharge status: Home / Self Care | Payer: Medicare Other | Attending: Emergency Medicine | Admitting: Emergency Medicine

## 2015-11-28 ENCOUNTER — Emergency Department: Payer: Medicare Other

## 2015-11-28 DIAGNOSIS — M545 Low back pain, unspecified: Secondary | ICD-10-CM

## 2015-11-28 DIAGNOSIS — Z7901 Long term (current) use of anticoagulants: Secondary | ICD-10-CM | POA: Diagnosis not present

## 2015-11-28 DIAGNOSIS — J45909 Unspecified asthma, uncomplicated: Secondary | ICD-10-CM | POA: Diagnosis not present

## 2015-11-28 DIAGNOSIS — M179 Osteoarthritis of knee, unspecified: Secondary | ICD-10-CM | POA: Diagnosis not present

## 2015-11-28 DIAGNOSIS — M47896 Other spondylosis, lumbar region: Secondary | ICD-10-CM | POA: Diagnosis not present

## 2015-11-28 DIAGNOSIS — I10 Essential (primary) hypertension: Secondary | ICD-10-CM | POA: Diagnosis not present

## 2015-11-28 DIAGNOSIS — F329 Major depressive disorder, single episode, unspecified: Secondary | ICD-10-CM | POA: Diagnosis not present

## 2015-11-28 DIAGNOSIS — I82401 Acute embolism and thrombosis of unspecified deep veins of right lower extremity: Secondary | ICD-10-CM | POA: Insufficient documentation

## 2015-11-28 DIAGNOSIS — Z79899 Other long term (current) drug therapy: Secondary | ICD-10-CM | POA: Diagnosis not present

## 2015-11-28 DIAGNOSIS — I6350 Cerebral infarction due to unspecified occlusion or stenosis of unspecified cerebral artery: Secondary | ICD-10-CM | POA: Diagnosis not present

## 2015-11-28 DIAGNOSIS — E669 Obesity, unspecified: Secondary | ICD-10-CM | POA: Insufficient documentation

## 2015-11-28 LAB — COMPREHENSIVE METABOLIC PANEL
ALBUMIN: 4.3 g/dL (ref 3.5–5.0)
ALK PHOS: 95 U/L (ref 38–126)
ALT: 61 U/L — AB (ref 14–54)
ANION GAP: 6 (ref 5–15)
AST: 46 U/L — AB (ref 15–41)
BILIRUBIN TOTAL: 0.3 mg/dL (ref 0.3–1.2)
BUN: 21 mg/dL — AB (ref 6–20)
CALCIUM: 9.2 mg/dL (ref 8.9–10.3)
CO2: 31 mmol/L (ref 22–32)
CREATININE: 0.87 mg/dL (ref 0.44–1.00)
Chloride: 101 mmol/L (ref 101–111)
GFR calc Af Amer: >60 mL/min (ref >60)
GFR calc non Af Amer: >60 mL/min (ref >60)
GLUCOSE: 105 mg/dL — AB (ref 65–99)
Potassium: 3.8 mmol/L (ref 3.5–5.1)
Sodium: 138 mmol/L (ref 135–145)
TOTAL PROTEIN: 7.7 g/dL (ref 6.5–8.1)

## 2015-11-28 LAB — URINALYSIS COMPLETE WITH MICROSCOPIC (ARMC ONLY)
Bilirubin Urine: NEGATIVE
GLUCOSE, UA: NEGATIVE mg/dL
Hgb urine dipstick: NEGATIVE
KETONES UR: NEGATIVE mg/dL
Leukocytes, UA: NEGATIVE
NITRITE: NEGATIVE
PROTEIN: NEGATIVE mg/dL
SPECIFIC GRAVITY, URINE: 1.014 (ref 1.005–1.030)
pH: 5 (ref 5.0–8.0)

## 2015-11-28 LAB — CBC
HCT: 35.7 % (ref 35.0–47.0)
Hemoglobin: 12.1 g/dL (ref 12.0–16.0)
MCH: 28 pg (ref 26.0–34.0)
MCHC: 33.9 g/dL (ref 32.0–36.0)
MCV: 82.6 fL (ref 80.0–100.0)
PLATELETS: 240 10*3/uL (ref 150–440)
RBC: 4.33 MIL/uL (ref 3.80–5.20)
RDW: 16.7 % — AB (ref 11.5–14.5)
WBC: 9.4 10*3/uL (ref 3.6–11.0)

## 2015-11-28 LAB — PROTIME-INR
INR: 1.08
PROTHROMBIN TIME: 14.2 s (ref 11.4–15.0)

## 2015-11-28 MED ORDER — ONDANSETRON HCL 4 MG/2ML IJ SOLN
4.0000 mg | Freq: Once | INTRAMUSCULAR | Status: AC
  Administered 2015-11-29: 4 mg via INTRAVENOUS
  Filled 2015-11-28: qty 2

## 2015-11-28 MED ORDER — MORPHINE SULFATE (PF) 4 MG/ML IV SOLN
4.0000 mg | Freq: Once | INTRAVENOUS | Status: AC
  Administered 2015-11-29: 4 mg via INTRAVENOUS
  Filled 2015-11-28: qty 1

## 2015-11-28 NOTE — ED Provider Notes (Signed)
[facility] Emergency Department Provider Note  ____________________________________________  Time seen: Approximately 11:24 PM  I have reviewed the triage vital signs and the nursing notes.   HISTORY  Chief Complaint Back Pain    HPI Lindsay Cooke is a 55 y.o. 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 She is being transitioned to warfarin and is currently on a 5 day Lovenox bridge. States she woke up this morning with midline low back pain which is nonradiating. Denies trauma/injury/fall. Denies overexertion, lifting or strain injury the day before. Complains of aching type pain which is not associated with numbness/tingling, leg weakness, radiation to either leg, bowel or bladder incontinence. Denies fever, chills, chest pain, shortness of breath, abdominal pain, hematuria, bloody stools. Does note she has a bruise in her right lower quadrant from her Lovenox injections. Nothing makes her pain better despite taking Norco, movement makes her pain worse.   Past Medical History  Diagnosis Date  . Reflux   . Benign essential hypertension   . Right leg DVT ([place])   . Obesity   . Lumbar spondylosis   . Anemia   . Asthma     Patient Active Problem List   Diagnosis Date Noted  . CVA (cerebral vascular accident) ([place]) 11/23/2015  . Hemoptysis 11/23/2015  . 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 FATIGUE 10/03/2008  . SNORING 10/03/2008  . GUAIAC POSITIVE STOOL 05/07/2008  . GASTROENTERITIS 02/23/2008  . DEPRESSION 11/27/2007  . MEDIAL MENISCUS TEAR, RIGHT 11/20/2007  . ANEMIA 11/15/2007  . OSTEOARTHRITIS, KNEE 11/15/2007  . GERD 09/06/2007  . OBESITY 07/21/2007  . HYPERTENSION 07/21/2007  . ASTHMA 07/21/2007  . KELOID 07/21/2007    Past Surgical History   Procedure Laterality Date  . Cholecystectomy    . Keloid excision    . Hernia repair      Current Outpatient Rx  Name  Route  Sig  Dispense  Refill  . albuterol (PROVENTIL HFA;VENTOLIN HFA) 108 (90 BASE) MCG/ACT inhaler   Inhalation   Inhale 2 puffs into the lungs every 6 (six) hours as needed for wheezing or shortness of breath.   1 Inhaler   2   . Apple Cider Vinegar (APPLE CIDER VINEGAR ULTRA) 600 MG CAPS   Oral   Take 1 capsule by mouth daily.         Marland Kitchen atorvastatin (LIPITOR) 40 MG tablet   Oral   Take 1 tablet (40 mg total) by mouth daily at 6 PM.   30 tablet   0   . azithromycin (ZITHROMAX) 250 MG tablet   Oral   Take 1 tablet (250 mg total) by mouth daily.   4 each   0   . cetirizine (ZYRTEC) 10 MG tablet   Oral   Take 10 mg by mouth daily.         . Cholecalciferol (D3-1000 PO)   Oral   Take 1,000 Units by mouth daily.          . Cinnamon 500 MG capsule   Oral   Take 500 mg by mouth daily.          . DULoxetine (CYMBALTA) 60 MG capsule   Oral   Take 60 mg by mouth 2 (two) times daily.         Marland Kitchen  enoxaparin (LOVENOX) 150 MG/ML injection   Subcutaneous   Inject 0.86 mLs (130 mg total) into the skin every 12 (twelve) hours.   10 Syringe   0   . hydrALAZINE (APRESOLINE) 25 MG tablet   Oral   Take 25 mg by mouth daily.         Marland Kitchen HYDROcodone-acetaminophen (NORCO/VICODIN) 5-325 MG tablet   Oral   Take 1 tablet by mouth every 6 (six) hours as needed for moderate pain.   12 tablet   0   . losartan-hydrochlorothiazide (HYZAAR) 100-25 MG tablet   Oral   Take 1 tablet by mouth daily.         Marland Kitchen omeprazole (PRILOSEC) 40 MG capsule   Oral   Take 1 capsule by mouth daily.         Marland Kitchen PATADAY 0.2 % SOLN   Both Eyes   Place 1 drop into both eyes daily as needed. For dry and itchy eyes.           Dispense as written.   . vitamin C (ASCORBIC ACID) 500 MG tablet   Oral   Take 500 mg by mouth daily.         . vitamin E (VITAMIN E)  400 UNIT capsule   Oral   Take 400 Units by mouth daily.         Marland Kitchen warfarin (COUMADIN) 5 MG tablet   Oral   Take 1 tablet (5 mg total) by mouth daily at 6 PM.   15 tablet   0   . guaiFENesin (ROBITUSSIN) 100 MG/5ML SOLN   Oral   Take 5 mLs (100 mg total) by mouth every 6 (six) hours as needed for cough or to loosen phlegm. Patient not taking: Reported on 11/29/2015   1200 mL   0     Allergies Penicillins  Family History  Problem Relation Age of Onset  . Breast cancer Sister   . Heart disease Mother   . Congestive Heart Failure Sister     Social History Social History  Substance Use Topics  . Smoking status: Never Smoker   . Smokeless tobacco: Never Used  . Alcohol Use: No    Review of Systems  Constitutional: No fever/chills. Eyes: No visual changes. ENT: No sore throat. Cardiovascular: Denies chest pain. Respiratory: Denies shortness of breath. Gastrointestinal: No abdominal pain.  No nausea, no vomiting.  No diarrhea.  No constipation. Genitourinary: Negative for dysuria. Musculoskeletal: Positive for back pain. Skin: Negative for rash. Neurological: Negative for headaches, focal weakness or numbness.  10-point ROS otherwise negative.  ____________________________________________   PHYSICAL EXAM:  VITAL SIGNS: ED Triage Vitals  Enc Vitals Group     BP 11/28/15 2229 131/77 mmHg     Pulse Rate 11/28/15 2229 59     Resp 11/28/15 2229 22     Temp 11/28/15 2229 98.5 F (36.9 C)     Temp Source 11/28/15 2229 Oral     SpO2 11/28/15 2229 100 %     Weight 11/28/15 2229 278 lb (126.1 kg)     Height 11/28/15 2229 5\' 1"  (1.549 m)     Head Cir --      Peak Flow --      Pain Score 11/28/15 2231 10     Pain Loc --      Pain Edu? --      Excl. in [place]? --     Constitutional: Alert and oriented. Well appearing and in no acute distress.  Texting on her cell phone. Eyes: Conjunctivae are normal. PERRL. EOMI. Head: Atraumatic. Nose: No  congestion/rhinnorhea. Mouth/Throat: Mucous membranes are moist.  Oropharynx non-erythematous. Neck: No stridor.  No cervical spine tenderness to palpation. Cardiovascular: Normal rate, regular rhythm. Grossly normal heart sounds.  Good peripheral circulation. Respiratory: Normal respiratory effort.  No retractions. Lungs CTAB. Gastrointestinal: Obese. Soft and nontender. No distention. No abdominal bruits. No CVA tenderness. Musculoskeletal: Midline lumbar spine tenderness to palpation. There is no external evidence of injury, no ecchymosis, no hematoma, no swelling. Limited range of motion secondary to pain. Pain on sitting up for exam. Negative straight leg raise bilaterally. No lower extremity tenderness nor edema.  No joint effusions. Neurologic:  Normal speech and language. No gross focal neurologic deficits are appreciated.  Skin:  Skin is warm, dry and intact. No rash noted. Psychiatric: Mood and affect are normal. Speech and behavior are normal.  ____________________________________________   LABS (all labs ordered are listed, but only abnormal results are displayed)  Labs Reviewed  COMPREHENSIVE METABOLIC PANEL - Abnormal; Notable for the following:    Glucose, Bld 105 (*)    BUN 21 (*)    AST 46 (*)    ALT 61 (*)    All other components within normal limits  CBC - Abnormal; Notable for the following:    RDW 16.7 (*)    All other components within normal limits  URINALYSIS COMPLETEWITH MICROSCOPIC ([facility] ONLY) - Abnormal; Notable for the following:    Color, Urine YELLOW (*)    APPearance HAZY (*)    Bacteria, UA MANY (*)    Squamous Epithelial / LPF 0-5 (*)    All other components within normal limits  PROTIME-INR   ____________________________________________  EKG  None ____________________________________________  RADIOLOGY  CT Lumbar interpreted per Dr. Quintella Reichert: No acute/ traumatic lumbar spine pathology. No soft tissue  hematoma. ____________________________________________   PROCEDURES  Procedure(s) performed: None  Critical Care performed: No  ____________________________________________   INITIAL IMPRESSION / ASSESSMENT AND PLAN / ED COURSE  Pertinent labs & imaging results that were available during my care of the patient were reviewed by me and considered in my medical decision making (see chart for details).  55 year old female currently on Lovenox injections while transitioning to warfarin for DVTs who presents with nontraumatic midline lumbar spine pain. She has no focal neurological deficits on exam. I discussed with radiologist the options for imaging (CT versus MRI); radiology recommends noncontrasted CT of lumbar spine to evaluate for bleeding issues.  ----------------------------------------- 1:44 AM on 11/29/2015 -----------------------------------------  Updated patient and family members of INR and CT results. Patient is resting comfortably without distress. Will prescribe Percocet for better pain relief and patient will follow-up with her doctor early next week. Strict return precautions given. Patient and family verbalized understanding and agree with plan of care. ____________________________________________   FINAL CLINICAL IMPRESSION(S) / ED DIAGNOSES  Final diagnoses:  Midline low back pain without sciatica      Paulette Blanch, MD 11/29/15 [phone]

## 2015-11-28 NOTE — ED Notes (Signed)
Pt c/o low back pain starting this morning. Pt denies injury. Pt denies hematuria and GI bleeding. Pt is presently anticoagulated for DVT in RLE. Pt is receiving lovenox shots at home for this. Pt c/o bruising to RLQ after receiving shot today. Pt ambulatory to triage and in no observable acute distress. Pt states she has been taking her PRN Norco for her pain q 6 hrs w/o relief. Pt denies exacerbation or relief from pain regardless of activity.

## 2015-11-29 DIAGNOSIS — M545 Low back pain: Secondary | ICD-10-CM | POA: Diagnosis not present

## 2015-11-29 MED ORDER — OXYCODONE-ACETAMINOPHEN 5-325 MG PO TABS
1.0000 | ORAL_TABLET | Freq: Once | ORAL | Status: AC
  Administered 2015-11-29: 1 via ORAL
  Filled 2015-11-29: qty 1

## 2015-11-29 MED ORDER — OXYCODONE-ACETAMINOPHEN 5-325 MG PO TABS: 1.0000 | ORAL_TABLET | ORAL | Status: DC | PRN

## 2015-11-29 NOTE — Discharge Instructions (Signed)
1. You may take Percocet as needed for pain. 2. Return to the ER for worsening symptoms, leg weakness, numbness/tingling, losing control of your bowel or bladder, or other concerns.  Back Pain, Adult Back pain is very common in adults.The cause of back pain is rarely dangerous and the pain often gets better over time.The cause of your back pain may not be known. Some common causes of back pain include:  Strain of the muscles or ligaments supporting the spine.  Wear and tear (degeneration) of the spinal disks.  Arthritis.  Direct injury to the back. For many people, back pain may return. Since back pain is rarely dangerous, most people can learn to manage this condition on their own. HOME CARE INSTRUCTIONS Watch your back pain for any changes. The following actions may help to lessen any discomfort you are feeling:  Remain active. It is stressful on your back to sit or stand in one place for long periods of time. Do not sit, drive, or stand in one place for more than 30 minutes at a time. Take short walks on even surfaces as soon as you are able.Try to increase the length of time you walk each day.  Exercise regularly as directed by your health care provider. Exercise helps your back heal faster. It also helps avoid future injury by keeping your muscles strong and flexible.  Do not stay in bed.Resting more than 1-2 days can delay your recovery.  Pay attention to your body when you bend and lift. The most comfortable positions are those that put less stress on your recovering back. Always use proper lifting techniques, including:  Bending your knees.  Keeping the load close to your body.  Avoiding twisting.  Find a comfortable position to sleep. Use a firm mattress and lie on your side with your knees slightly bent. If you lie on your back, put a pillow under your knees.  Avoid feeling anxious or stressed.Stress increases muscle tension and can worsen back pain.It is important to  recognize when you are anxious or stressed and learn ways to manage it, such as with exercise.  Take medicines only as directed by your health care provider. Over-the-counter medicines to reduce pain and inflammation are often the most helpful.Your health care provider may prescribe muscle relaxant drugs.These medicines help dull your pain so you can more quickly return to your normal activities and healthy exercise.  Apply ice to the injured area:  Put ice in a plastic bag.  Place a towel between your skin and the bag.  Leave the ice on for 20 minutes, 2-3 times a day for the first 2-3 days. After that, ice and heat may be alternated to reduce pain and spasms.  Maintain a healthy weight. Excess weight puts extra stress on your back and makes it difficult to maintain good posture. SEEK MEDICAL CARE IF:  You have pain that is not relieved with rest or medicine.  You have increasing pain going down into the legs or buttocks.  You have pain that does not improve in one week.  You have night pain.  You lose weight.  You have a fever or chills. SEEK IMMEDIATE MEDICAL CARE IF:   You develop new bowel or bladder control problems.  You have unusual weakness or numbness in your arms or legs.  You develop nausea or vomiting.  You develop abdominal pain.  You feel faint.   This information is not intended to replace advice given to you by your health care  provider. Make sure you discuss any questions you have with your health care provider.   Document Released: 08/23/2005 Document Revised: 09/13/2014 Document Reviewed: 12/25/2013 Elsevier Interactive Patient Education [employer].

## 2015-12-01 LAB — FACTOR 5 LEIDEN

## 2015-12-01 LAB — PROTEIN C DEFICIENCY PROFILE
PROTEIN C ACTIVITY: 65 % — AB (ref 73–180)
PROTEIN C, TOTAL: 67 % (ref 60–150)

## 2015-12-01 LAB — PROTEIN S PANEL
PROTEIN S AG FREE: 75 % (ref 57–157)
Protein S Activity: 160 % — ABNORMAL HIGH (ref 63–140)
Protein S Ag, Total: 128 % (ref 60–150)

## 2015-12-04 DIAGNOSIS — K5900 Constipation, unspecified: Secondary | ICD-10-CM | POA: Insufficient documentation

## 2015-12-10 ENCOUNTER — Inpatient Hospital Stay: Payer: Medicare Other | Attending: Internal Medicine | Admitting: Internal Medicine

## 2015-12-10 VITALS — BP 128/81 | HR 82 | Temp 97.3°F | Resp 18 | Wt 279.1 lb

## 2015-12-10 DIAGNOSIS — I517 Cardiomegaly: Secondary | ICD-10-CM | POA: Diagnosis not present

## 2015-12-10 DIAGNOSIS — J45909 Unspecified asthma, uncomplicated: Secondary | ICD-10-CM | POA: Diagnosis not present

## 2015-12-10 DIAGNOSIS — Z79899 Other long term (current) drug therapy: Secondary | ICD-10-CM | POA: Insufficient documentation

## 2015-12-10 DIAGNOSIS — Z803 Family history of malignant neoplasm of breast: Secondary | ICD-10-CM | POA: Insufficient documentation

## 2015-12-10 DIAGNOSIS — Z8673 Personal history of transient ischemic attack (TIA), and cerebral infarction without residual deficits: Secondary | ICD-10-CM

## 2015-12-10 DIAGNOSIS — M549 Dorsalgia, unspecified: Secondary | ICD-10-CM | POA: Diagnosis not present

## 2015-12-10 DIAGNOSIS — M25561 Pain in right knee: Secondary | ICD-10-CM | POA: Diagnosis not present

## 2015-12-10 DIAGNOSIS — Z7901 Long term (current) use of anticoagulants: Secondary | ICD-10-CM

## 2015-12-10 DIAGNOSIS — D649 Anemia, unspecified: Secondary | ICD-10-CM | POA: Diagnosis not present

## 2015-12-10 DIAGNOSIS — M479 Spondylosis, unspecified: Secondary | ICD-10-CM | POA: Insufficient documentation

## 2015-12-10 DIAGNOSIS — Z86718 Personal history of other venous thrombosis and embolism: Secondary | ICD-10-CM | POA: Insufficient documentation

## 2015-12-10 DIAGNOSIS — E669 Obesity, unspecified: Secondary | ICD-10-CM | POA: Diagnosis not present

## 2015-12-10 DIAGNOSIS — K219 Gastro-esophageal reflux disease without esophagitis: Secondary | ICD-10-CM

## 2015-12-10 DIAGNOSIS — I82409 Acute embolism and thrombosis of unspecified deep veins of unspecified lower extremity: Secondary | ICD-10-CM

## 2015-12-10 NOTE — Progress Notes (Signed)
[place] CONSULT NOTE  No care team member to display  CHIEF COMPLAINTS/PURPOSE OF CONSULTATION:  DVT recurrent  HEMATOLOGIC HISTORY:  # ? 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 morbidly obese female with recently diagnosed right lower extremity DVT 2 most recently September 2016 that showed a right lower extremity DVT was started on Eliquis in sep 2016.   However in the interim patient was diagnosed with a acute stroke- when she presented with slurred speech approximately 2 weeks ago. She was evaluated by neurology; took her off the Eliquis and she she is currently on Coumadin.   Patient continues to have right knee pain; right leg swelling more than left. She also had recent ultrasound the hospital that showed no DVT in the lower extremity.   MEDICAL HISTORY:  Past Medical History  Diagnosis Date  . Reflux   . Benign essential hypertension   . Right leg DVT ([place])   . Obesity   . Lumbar spondylosis   . Anemia   . Asthma     SURGICAL HISTORY: Past Surgical History  Procedure Laterality Date  . Cholecystectomy    . Keloid excision    . Hernia repair      SOCIAL HISTORY: Social History   Social History  . Marital Status: Single    Spouse Name: N/A  . Number of Children: N/A  . Years of Education: N/A   Occupational History  . Not on file.   Social History Main Topics  . Smoking status: Never Smoker   . Smokeless tobacco: Never Used  . Alcohol Use: No  . Drug Use: No  . Sexual Activity: No   Other Topics Concern  . Not on file   Social History Narrative    FAMILY HISTORY: Family History  Problem Relation Age of Onset  . Breast  cancer Sister   . Heart disease Mother   . Congestive Heart Failure Sister     ALLERGIES:  is allergic to penicillins.  MEDICATIONS:  Current Outpatient Prescriptions  Medication Sig Dispense Refill  . albuterol (PROVENTIL HFA;VENTOLIN HFA) 108 (90 BASE) MCG/ACT inhaler Inhale 2 puffs into the lungs every 6 (six) hours as needed for wheezing or shortness of breath. 1 Inhaler 2  . Apple Cider Vinegar (APPLE CIDER VINEGAR ULTRA) 600 MG CAPS Take 1 capsule by mouth daily.    Marland Kitchen atorvastatin (LIPITOR) 40 MG tablet Take 1 tablet (40 mg total) by mouth daily at 6 PM. 30 tablet 0  . cetirizine (ZYRTEC) 10 MG tablet Take 10 mg by mouth daily.    . Cholecalciferol (D3-1000 PO) Take 1,000 Units by mouth daily.     . Cinnamon 500 MG capsule Take 500 mg by mouth daily.     . DULoxetine (CYMBALTA) 60 MG capsule Take 60 mg by mouth 2 (two) times daily.    Marland Kitchen guaiFENesin (ROBITUSSIN) 100 MG/5ML SOLN Take 5 mLs (100 mg total) by mouth every 6 (six) hours as needed for cough or to loosen phlegm. 1200 mL 0  . hydrALAZINE (APRESOLINE) 25 MG tablet Take 25 mg by mouth daily.    Marland Kitchen losartan-hydrochlorothiazide (HYZAAR) 100-25 MG tablet Take 1 tablet by mouth daily.    Marland Kitchen omeprazole (  PRILOSEC) 40 MG capsule Take 1 capsule by mouth daily.    Marland Kitchen oxyCODONE-acetaminophen (ROXICET) 5-325 MG tablet Take 1 tablet by mouth every 4 (four) hours as needed for severe pain. 15 tablet 0  . PATADAY 0.2 % SOLN Place 1 drop into both eyes daily as needed. For dry and itchy eyes.    . vitamin C (ASCORBIC ACID) 500 MG tablet Take 500 mg by mouth daily.    . vitamin E (VITAMIN E) 400 UNIT capsule Take 400 Units by mouth daily.    Marland Kitchen warfarin (COUMADIN) 5 MG tablet Take 1 tablet (5 mg total) by mouth daily at 6 PM. (Patient taking differently: Take 10 mg by mouth daily at 6 PM. ) 15 tablet 0   No current facility-administered medications for this visit.      Marland Kitchen  PHYSICAL EXAMINATION: ECOG PERFORMANCE STATUS: 1 - Symptomatic but  completely ambulatory  Filed Vitals:   12/10/15 1011  BP: 128/81  Pulse: 82  Temp: 97.3 F (36.3 C)  Resp: 18   Filed Weights   12/10/15 1011  Weight: 279 lb 1.6 oz (126.6 kg)    GENERAL: Well-nourished well-developed; Alert, no distress; mild discomfort because of knee pain. Morbidly obese; has difficulty getting onto the table because of habitus. EYES: no pallor or icterus OROPHARYNX: no thrush or ulceration; good dentition  NECK: supple, no masses felt LYMPH: no palpable lymphadenopathy in the cervical, axillary or inguinal regions LUNGS: clear to auscultation and No wheeze or crackles HEART/CVS: regular rate & rhythm and no murmurs; Right thigh- seems to be swollen compared to the left. No tenderness noted.  ABDOMEN: abdomen soft, non-tender and normal bowel sounds Musculoskeletal:no cyanosis of digits and no clubbing  PSYCH: alert & oriented x 3 with fluent speech NEURO: no focal motor/sensory deficits SKIN: no rashes; keloid noted on the right side of the face.  LABORATORY DATA:  I have reviewed the data as listed Lab Results  Component Value Date   WBC 9.4 11/28/2015   HGB 12.1 11/28/2015   HCT 35.7 11/28/2015   MCV 82.6 11/28/2015   PLT 240 11/28/2015    Recent Labs  11/23/15 1226 11/24/15 0415 11/28/15 2236  NA 139 139 138  K 3.4* 3.7 3.8  CL 105 108 101  CO2 31 26 31   GLUCOSE 85 108* 105*  BUN 17 14 21*  CREATININE 0.78 0.79 0.87  CALCIUM 9.3 8.7* 9.2  GFRNONAA >60 >60 >60  GFRAA >60 >60 >60  PROT  --  6.5 7.7  ALBUMIN  --  3.7 4.3  AST  --  19 46*  ALT  --  21 61*  ALKPHOS  --  57 95  BILITOT  --  0.8 0.3    RADIOGRAPHIC STUDIES: I have personally reviewed the radiological images as listed and agreed with the findings in the report. Ct Head Wo Contrast  11/23/2015  CLINICAL DATA:  Slurred speech. EXAM: CT HEAD WITHOUT CONTRAST TECHNIQUE: Contiguous axial images were obtained from the base of the skull through the vertex without  intravenous contrast. COMPARISON:  05/29/2014 head CT. FINDINGS: The anterior temporal fossa is poorly visualized due to extensive streak artifact from the skull base. No evidence of parenchymal hemorrhage or extra-axial fluid collection. No mass lesion, mass effect, or midline shift. No CT evidence of acute infarct. Cerebral volume is age appropriate. No ventriculomegaly. The visualized paranasal sinuses are essentially clear. The mastoid air cells are unopacified. No evidence of calvarial fracture. IMPRESSION: Negative head CT.  No acute intracranial abnormality. Electronically Signed   By: Ilona Sorrel M.D.   On: 11/23/2015 13:42   Ct Angio Chest Pe W/cm &/or Wo Cm  11/23/2015  CLINICAL DATA:  Hemoptysis 2 days ago. History of blood clots. Anti coagulation with Eliquis. EXAM: CT ANGIOGRAPHY CHEST WITH CONTRAST TECHNIQUE: Multidetector CT imaging of the chest was performed using the standard protocol during bolus administration of intravenous contrast. Multiplanar CT image reconstructions and MIPs were obtained to evaluate the vascular anatomy. CONTRAST:  39mL OMNIPAQUE IOHEXOL 350 MG/ML SOLN COMPARISON:  Multiple exams, including 02/12/2015 and 06/08/2008 FINDINGS: Body habitus reduces diagnostic sensitivity and specificity. Mediastinum/Nodes: No filling defect is identified in the pulmonary arterial tree to suggest pulmonary embolus. Mild cardiomegaly. Lungs/Pleura: Linear high density branching structures in the right middle lobe anteriorly on images 68-74 of series 7 and similar structures in the right lower lobe noted. Similar linear high density structures are present in the posterior basal segments of the lower lobes. The lungs appear otherwise clear. Upper abdomen: Unremarkable Musculoskeletal: Lower thoracic spondylosis. Review of the MIP images confirms the above findings. IMPRESSION: 1. No filling defect is identified in the pulmonary arterial tree to suggest pulmonary embolus. 2. There is some tiny  linear branching densities in the periphery of the right middle lobe and seen to a lesser extent in the lower lobes and lingula. Differential diagnostic considerations include small broncho let us along bronchiectatic peripheral airways related to prior atypical infectious process, old granulomatous disease, or possibly remote and likely clinically inconsequential methacrylate embolization, if the patient has had of vertebral augmentation or implant. In any case these branching densities are not appreciably changed from 2014. 3. Lower thoracic spondylosis. Electronically Signed   By: Van Clines M.D.   On: 11/23/2015 18:18   Ct Lumbar Spine Wo Contrast  11/29/2015  CLINICAL DATA:  55 year old female on Coumadin presenting with midline back pain. No known injury. EXAM: CT LUMBAR SPINE WITHOUT CONTRAST TECHNIQUE: Multidetector CT imaging of the lumbar spine was performed without intravenous contrast administration. Multiplanar CT image reconstructions were also generated. COMPARISON:  CT dated 11/12/2012 FINDINGS: Evaluation of the solid organs is limited in the absence of intravenous contrast. There is no acute fracture or subluxation of the lumbar spine. The vertebral body heights and disc spaces are maintained. There is mild degenerative changes with anterior spurring. The transverse and spinous processes are intact. The paraspinal musculature appear unremarkable. No soft tissue or intramuscular hematoma identified. There is a 3 mm nonobstructing left renal upper pole calculus. No hydronephrosis. Small focal area of hypodensity in the medial aspect of the right kidney is not well characterized but may represent a cyst. Multiple calcific densities noted along the left gonadal vein. The visualized abdominal aorta and IVC appear unremarkable. No dilated bowel noted in the visualized abdomen. The appendix appears unremarkable. There is no adenopathy. IMPRESSION: No acute/ traumatic lumbar spine pathology. No  soft tissue hematoma. Electronically Signed   By: Anner Crete M.D.   On: 11/29/2015 00:30   Mr Brain Wo Contrast  11/23/2015  CLINICAL DATA:  Slurred speech.  Eye swelling.  Hemoptysis. EXAM: MRI HEAD WITHOUT CONTRAST TECHNIQUE: Multiplanar, multiecho pulse sequences of the brain and surrounding structures were obtained without intravenous contrast. COMPARISON:  CT head 11/23/2015 FINDINGS: Two small areas of possible restricted diffusion in the right parietal cortex suggestive of small acute infarcts. These measure under 5 mm each. No other areas of restricted diffusion. Negative for chronic ischemia. Negative for demyelinating disease. Cerebral  white matter normal Ventricle size normal.  Cerebral volume normal. Negative for hemorrhage or mass. No shift of the midline structures. Image quality degraded by mild motion Paranasal sinuses clear.  Pituitary normal in size.  Normal orbit. IMPRESSION: Probable small areas of acute infarct in the right parietal cortex. Otherwise negative. Mild image quality degradation due to motion. Electronically Signed   By: Franchot Gallo M.D.   On: 11/23/2015 16:44   US Carotid Bilateral  11/23/2015  CLINICAL DATA:  Hemoptysis. Slurred speech for 2 days. History of hypertension, TIA, syncope. EXAM: BILATERAL CAROTID DUPLEX ULTRASOUND TECHNIQUE: Pearline Cables scale imaging, color Doppler and duplex ultrasound were performed of bilateral carotid and vertebral arteries in the neck. COMPARISON:  None. FINDINGS: Criteria: Quantification of carotid stenosis is based on velocity parameters that correlate the residual internal carotid diameter with NASCET-based stenosis levels, using the diameter of the distal internal carotid lumen as the denominator for stenosis measurement. The following velocity measurements were obtained: RIGHT ICA:  94/31 cm/sec CCA:  [id number] cm/sec SYSTOLIC ICA/CCA RATIO:  0.9 DIASTOLIC ICA/CCA RATIO:  1 ECA:  63 cm/sec LEFT ICA:  86/39 cm/sec CCA:  [id number] cm/sec  SYSTOLIC ICA/CCA RATIO:  0.8 DIASTOLIC ICA/CCA RATIO:  1.2 ECA:  87 cm/sec RIGHT CAROTID ARTERY: Mild noncalcific atherosclerotic changes in the carotid bifurcation. Color flow is demonstrated throughout the carotid bifurcation. There is a focal bulbous appearing area within the distal carotid artery with tortuous flow possibly indicating aneurysmal dilatation. No significant thrombus or wall thickening. Otherwise normal waveforms. RIGHT VERTEBRAL ARTERY:  Antegrade flow is demonstrated. LEFT CAROTID ARTERY: Mild noncalcific atherosclerotic changes. Color flow is demonstrated throughout the carotid bifurcation. Normal waveforms. No significant thrombus or wall thickening. LEFT VERTEBRAL ARTERY:  Antegrade flow is demonstrated. IMPRESSION: No evidence of hemodynamically significant stenosis of either right or the left internal carotid artery. Nonspecific bulbous appearance of the right distal common carotid artery with tortuous blood possibly suggesting ectasia or aneurysm. No significant thrombus or wall thickening. Electronically Signed   By: Lucienne Capers M.D.   On: 11/23/2015 19:21   US Venous Img Lower Bilateral  11/23/2015  CLINICAL DATA:  Bilateral leg pain for 1 year.  History of DVTs. EXAM: BILATERAL LOWER EXTREMITY VENOUS DOPPLER ULTRASOUND TECHNIQUE: Gray-scale sonography with graded compression, as well as color Doppler and duplex ultrasound were performed to evaluate the lower extremity deep venous systems from the level of the common femoral vein and including the common femoral, femoral, profunda femoral, popliteal and calf veins including the posterior tibial, peroneal and gastrocnemius veins when visible. The superficial great saphenous vein was also interrogated. Spectral Doppler was utilized to evaluate flow at rest and with distal augmentation maneuvers in the common femoral, femoral and popliteal veins. COMPARISON:  None. FINDINGS: RIGHT LOWER EXTREMITY Common Femoral Vein: No evidence of  thrombus. Normal compressibility, respiratory phasicity and response to augmentation. Saphenofemoral Junction: No evidence of thrombus. Normal compressibility and flow on color Doppler imaging. Profunda Femoral Vein: No evidence of thrombus. Normal compressibility and flow on color Doppler imaging. Femoral Vein: No evidence of thrombus. Normal compressibility, respiratory phasicity and response to augmentation. Popliteal Vein: No evidence of thrombus. Normal compressibility, respiratory phasicity and response to augmentation. Calf Veins: No evidence of thrombus. Normal compressibility and flow on color Doppler imaging. Superficial Great Saphenous Vein: No evidence of thrombus. Normal compressibility and flow on color Doppler imaging. Venous Reflux:  None. Other Findings:  None. LEFT LOWER EXTREMITY Common Femoral Vein: No evidence of thrombus. Normal compressibility, respiratory phasicity and  response to augmentation. Saphenofemoral Junction: No evidence of thrombus. Normal compressibility and flow on color Doppler imaging. Profunda Femoral Vein: No evidence of thrombus. Normal compressibility and flow on color Doppler imaging. Femoral Vein: No evidence of thrombus. Normal compressibility, respiratory phasicity and response to augmentation. Popliteal Vein: No evidence of thrombus. Normal compressibility, respiratory phasicity and response to augmentation. Calf Veins: No evidence of thrombus. Normal compressibility and flow on color Doppler imaging. Superficial Great Saphenous Vein: No evidence of thrombus. Normal compressibility and flow on color Doppler imaging. Venous Reflux:  None. Other Findings:  None. IMPRESSION: No evidence of deep venous thrombosis. Electronically Signed   By: Lucienne Capers M.D.   On: 11/23/2015 19:15    ASSESSMENT & PLAN:   # Right lower extremity DVT 2; and most recently September 2016 most recently on Eliquis; however in March 2017 switch over to Coumadin given the recent stroke.  Most recent ultrasound March 2017-negative for any DVT. In general I would've recommended total of 6 months of anticoagulation [so stopping anti-coagulation at this time]; however this is complicated by the recent stroke/the recommendations of neurology to switch to Coumadin [facility] discussion below].   #  Stroke By Symptoms/ MRI right parietal cortex acute infarct-while on Eliquis. Currently on Coumadin as per recommendations from neurology/PT INR monitored by primary care physician.Marland Kitchen However patient might have unpredictable levels of PT INR while on Coumadin. However I would defer this to PCP/neurology. Patient needs outpatient neurology evaluation-optimal treatment of her stroke. I left a message for patient's primary care physician to discuss the above recommendations.   # Otherwise patient will follow-up with me in approximately 3 months with labs.    Cammie Sickle, MD 12/10/2015 10:40 AM

## 2015-12-12 ENCOUNTER — Telehealth: Payer: Self-pay | Admitting: *Deleted

## 2015-12-12 NOTE — Telephone Encounter (Signed)
Pt called. Left 2 msg on clinic hot line. Pt wants to know if Dr. Rogue Bussing talked with Dr. Humphrey Rolls.  I spoke with patient. I will ask Dr. Jacinto Reap and will provide feedback for patient.

## 2015-12-12 NOTE — Telephone Encounter (Signed)
i left message for Dr.khan to call back- i am awaiting to hear from their office.

## 2015-12-15 NOTE — Telephone Encounter (Signed)
Called patient to let her know that Dr. Rogue Bussing has not yet heard from Dr. Humphrey Rolls.  A message has been left for him to call.  Will notify patient once they have spoken.  Patient verbalized understanding.

## 2015-12-17 DIAGNOSIS — G459 Transient cerebral ischemic attack, unspecified: Secondary | ICD-10-CM | POA: Insufficient documentation

## 2015-12-17 DIAGNOSIS — I639 Cerebral infarction, unspecified: Secondary | ICD-10-CM | POA: Insufficient documentation

## 2015-12-17 DIAGNOSIS — R7303 Prediabetes: Secondary | ICD-10-CM | POA: Insufficient documentation

## 2015-12-17 DIAGNOSIS — J4520 Mild intermittent asthma, uncomplicated: Secondary | ICD-10-CM | POA: Insufficient documentation

## 2015-12-17 DIAGNOSIS — F3340 Major depressive disorder, recurrent, in remission, unspecified: Secondary | ICD-10-CM | POA: Insufficient documentation

## 2015-12-25 DIAGNOSIS — I2510 Atherosclerotic heart disease of native coronary artery without angina pectoris: Secondary | ICD-10-CM | POA: Insufficient documentation

## 2015-12-29 NOTE — Telephone Encounter (Signed)
Can you please pt's pcp office to have her PCP call me- on my cell [phone]. I have already called myself; and no reply back. Thx

## 2016-01-02 NOTE — Telephone Encounter (Signed)
Pauline Good, RN contacted Dr. April Manson office [phone]) yesterday to followup on why Dr. Humphrey Rolls has not returned call to Dr. Rogue Bussing. Dr. Humphrey Rolls is out of the office for the next week.  Dr. Rogue Bussing made aware.

## 2016-02-15 ENCOUNTER — Emergency Department
Admission: EM | Admit: 2016-02-15 | Discharge: 2016-02-15 | Disposition: A | Discharge status: Home / Self Care | Payer: Medicare Other | Attending: Emergency Medicine | Admitting: Emergency Medicine

## 2016-02-15 ENCOUNTER — Encounter: Payer: Self-pay | Admitting: Emergency Medicine

## 2016-02-15 ENCOUNTER — Emergency Department: Payer: Medicare Other

## 2016-02-15 DIAGNOSIS — M7122 Synovial cyst of popliteal space [Baker], left knee: Secondary | ICD-10-CM | POA: Insufficient documentation

## 2016-02-15 DIAGNOSIS — Z79899 Other long term (current) drug therapy: Secondary | ICD-10-CM | POA: Insufficient documentation

## 2016-02-15 DIAGNOSIS — M179 Osteoarthritis of knee, unspecified: Secondary | ICD-10-CM | POA: Diagnosis not present

## 2016-02-15 DIAGNOSIS — J45909 Unspecified asthma, uncomplicated: Secondary | ICD-10-CM | POA: Diagnosis not present

## 2016-02-15 DIAGNOSIS — Z8673 Personal history of transient ischemic attack (TIA), and cerebral infarction without residual deficits: Secondary | ICD-10-CM | POA: Diagnosis not present

## 2016-02-15 DIAGNOSIS — R52 Pain, unspecified: Secondary | ICD-10-CM

## 2016-02-15 DIAGNOSIS — Z7901 Long term (current) use of anticoagulants: Secondary | ICD-10-CM | POA: Diagnosis not present

## 2016-02-15 DIAGNOSIS — I10 Essential (primary) hypertension: Secondary | ICD-10-CM | POA: Diagnosis not present

## 2016-02-15 DIAGNOSIS — E669 Obesity, unspecified: Secondary | ICD-10-CM | POA: Insufficient documentation

## 2016-02-15 DIAGNOSIS — M79662 Pain in left lower leg: Secondary | ICD-10-CM | POA: Diagnosis present

## 2016-02-15 DIAGNOSIS — F329 Major depressive disorder, single episode, unspecified: Secondary | ICD-10-CM | POA: Diagnosis not present

## 2016-02-15 DIAGNOSIS — Z86718 Personal history of other venous thrombosis and embolism: Secondary | ICD-10-CM | POA: Diagnosis not present

## 2016-02-15 MED ORDER — OXYCODONE-ACETAMINOPHEN 5-325 MG PO TABS
1.0000 | ORAL_TABLET | Freq: Once | ORAL | Status: AC
  Administered 2016-02-15: 1 via ORAL
  Filled 2016-02-15: qty 1

## 2016-02-15 MED ORDER — OXYCODONE-ACETAMINOPHEN 5-325 MG PO TABS: 1.0000 | ORAL_TABLET | Freq: Four times a day (QID) | ORAL | Status: DC | PRN

## 2016-02-15 NOTE — ED Notes (Signed)
Patient presents to the ED with left leg pain and swelling x 1 week.  Patient states she has history of blood clots and takes blood thinners.  Patient is in no obvious distress at this time.

## 2016-02-15 NOTE — ED Provider Notes (Signed)
[facility] Emergency Department Provider Note  ____________________________________________  Time seen: 1:30 PM  I have reviewed the triage vital signs and the nursing notes.   HISTORY  Chief Complaint Leg Pain and Leg Swelling    HPI Lindsay Cooke is a 55 y.o. female who comes in complaining of left leg pain and swelling for the past week. She notes that she has had a bulging behind the left knee over this time. No chest pain shortness of breath or fevers. She takes Coumadin and has been compliant with her usual dosage and Coumadin diet, INRs have remained stable in the therapeutic window. No weakness or numbness in the leg. No new trauma.      Past Medical History  Diagnosis Date  . Reflux   . Benign essential hypertension   . Right leg DVT ([place])   . Obesity   . Lumbar spondylosis   . Anemia   . Asthma      Patient Active Problem List   Diagnosis Date Noted  . CVA (cerebral vascular accident) ([place]) 11/23/2015  . Hemoptysis 11/23/2015  . 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 FATIGUE 10/03/2008  . SNORING 10/03/2008  . GUAIAC POSITIVE STOOL 05/07/2008  . GASTROENTERITIS 02/23/2008  . DEPRESSION 11/27/2007  . MEDIAL MENISCUS TEAR, RIGHT 11/20/2007  . ANEMIA 11/15/2007  . OSTEOARTHRITIS, KNEE 11/15/2007  . GERD 09/06/2007  . OBESITY 07/21/2007  . HYPERTENSION 07/21/2007  . ASTHMA 07/21/2007  . KELOID 07/21/2007     Past Surgical History  Procedure Laterality Date  . Cholecystectomy    . Keloid excision    . Hernia repair       Current Outpatient Rx  Name  Route  Sig  Dispense  Refill  . albuterol (PROVENTIL HFA;VENTOLIN HFA) 108 (90 BASE) MCG/ACT inhaler   Inhalation   Inhale 2 puffs into the lungs every 6 (six) hours as needed for wheezing or shortness of breath.   1 Inhaler   2   . Apple Cider Vinegar (APPLE CIDER VINEGAR ULTRA) 600 MG CAPS   Oral   Take 1 capsule by mouth daily.         Marland Kitchen atorvastatin  (LIPITOR) 40 MG tablet   Oral   Take 1 tablet (40 mg total) by mouth daily at 6 PM.   30 tablet   0   . cetirizine (ZYRTEC) 10 MG tablet   Oral   Take 10 mg by mouth daily.         . Cholecalciferol (D3-1000 PO)   Oral   Take 1,000 Units by mouth daily.          . Cinnamon 500 MG capsule   Oral   Take 500 mg by mouth daily.          . DULoxetine (CYMBALTA) 60 MG capsule   Oral   Take 60 mg by mouth 2 (two) times daily.         Marland Kitchen guaiFENesin (ROBITUSSIN) 100 MG/5ML SOLN   Oral   Take 5 mLs (100 mg total) by mouth every 6 (six) hours as needed for cough or to loosen phlegm.   1200 mL   0   . hydrALAZINE (APRESOLINE) 25 MG tablet   Oral   Take 25 mg by mouth daily.         Marland Kitchen losartan-hydrochlorothiazide (HYZAAR) 100-25 MG tablet   Oral   Take 1 tablet by mouth daily.         Marland Kitchen omeprazole (  PRILOSEC) 40 MG capsule   Oral   Take 1 capsule by mouth daily.         Marland Kitchen oxyCODONE-acetaminophen (ROXICET) 5-325 MG tablet   Oral   Take 1 tablet by mouth every 6 (six) hours as needed for severe pain.   10 tablet   0   . PATADAY 0.2 % SOLN   Both Eyes   Place 1 drop into both eyes daily as needed. For dry and itchy eyes.           Dispense as written.   . vitamin C (ASCORBIC ACID) 500 MG tablet   Oral   Take 500 mg by mouth daily.         . vitamin E (VITAMIN E) 400 UNIT capsule   Oral   Take 400 Units by mouth daily.         Marland Kitchen warfarin (COUMADIN) 5 MG tablet   Oral   Take 1 tablet (5 mg total) by mouth daily at 6 PM. Patient taking differently: Take 10 mg by mouth daily at 6 PM.    15 tablet   0      Allergies Penicillins   Family History  Problem Relation Age of Onset  . Breast cancer Sister   . Heart disease Mother   . Congestive Heart Failure Sister     Social History Social History  Substance Use Topics  . Smoking status: Never Smoker   . Smokeless tobacco: Never Used  . Alcohol Use: No    Review of  Systems  Constitutional:   No fever or chills.  Eyes:   No vision changes.  ENT:   No sore throat. No rhinorrhea. Cardiovascular:   No chest pain. Respiratory:   No dyspnea or cough. Gastrointestinal:   Negative for abdominal pain, vomiting and diarrhea.  Genitourinary:   Negative for dysuria or difficulty urinating. Musculoskeletal:   Positive left leg pain Neurological:   Negative for headaches 10-point ROS otherwise negative.  ____________________________________________   PHYSICAL EXAM:  VITAL SIGNS: ED Triage Vitals  Enc Vitals Group     BP 02/15/16 1031 108/69 mmHg     Pulse Rate 02/15/16 1031 79     Resp 02/15/16 1031 18     Temp 02/15/16 1031 98.8 F (37.1 C)     Temp Source 02/15/16 1031 Oral     SpO2 02/15/16 1031 100 %     Weight 02/15/16 1031 240 lb (108.863 kg)     Height 02/15/16 1031 5\' 1"  (1.549 m)     Head Cir --      Peak Flow --      Pain Score 02/15/16 1031 10     Pain Loc --      Pain Edu? --      Excl. in [place]? --     Vital signs reviewed, nursing assessments reviewed.   Constitutional:   Alert and oriented. Well appearing and in no distress. Eyes:   No scleral icterus. No conjunctival pallor. PERRL. EOMI.  No nystagmus. ENT   Head:   Normocephalic and atraumatic.   Nose:   No congestion/rhinnorhea. No septal hematoma   Mouth/Throat:   MMM, no pharyngeal erythema. No peritonsillar mass.    Neck:   No stridor. No SubQ emphysema. No meningismus. Hematological/Lymphatic/Immunilogical:   No cervical lymphadenopathy. Cardiovascular:   RRR. Symmetric bilateral radial and DP pulses.  No murmurs.  Respiratory:   Normal respiratory effort without tachypnea nor retractions. Breath sounds are clear and equal  bilaterally. No wheezes/rales/rhonchi. Gastrointestinal:   Soft and nontender. Non distended. There is no CVA tenderness.  No rebound, rigidity, or guarding. Genitourinary:   deferred Musculoskeletal:   Normal range of motion in all  extremities. Diffuse mild tenderness of the leg posteriorly. In the popliteal fossa there is a bulging consistent with Baker's cyst is very tender to touch.She has full range of motion in the knee, not consistent with septic arthritis... No joint effusions.  Diffuse Tenderness without palpable cords, negative Homans sign..  No edema. There is also tenderness of the "asymptomatic" right lower extremity. No erythema or inflammatory changes of the left leg Neurologic:   Normal speech and language.  CN 2-10 normal. Motor grossly intact. No gross focal neurologic deficits are appreciated.  Skin:    Skin is warm, dry and intact. No rash noted.  No petechiae, purpura, or bullae.  ____________________________________________    LABS (pertinent positives/negatives) (all labs ordered are listed, but only abnormal results are displayed) Labs Reviewed - No data to display ____________________________________________   EKG    ____________________________________________    RADIOLOGY  Ultrasound left lower extremity unremarkable, negative for DVT  ____________________________________________   PROCEDURES   ____________________________________________   INITIAL IMPRESSION / ASSESSMENT AND PLAN / ED COURSE  Pertinent labs & imaging results that were available during my care of the patient were reviewed by me and considered in my medical decision making (see chart for details).  Patient well appearing no acute distress, normal vital signs. Complains of left leg pain and swelling, found to have a left popliteal cyst consistent with Baker's cyst. This area is tender. I do not believe that she has osteomyelitis fracture dislocation or septic arthritis. We'll give pain control with Percocet, refer to orthopedics for further management. She reports compliance with Coumadin therapy, I see no indication to repeat labs at this time. I recommended the patient follow up with primary care for repeat  ultrasound of lower extremity in 1-2 weeks for further monitoring of her symptoms.     ____________________________________________   FINAL CLINICAL IMPRESSION(S) / ED DIAGNOSES  Final diagnoses:  Baker cyst, left  Left leg pain     Portions of this note were generated with dragon dictation software. Dictation errors may occur despite best attempts at proofreading.   Carrie Mew, MD 02/15/16 [phone]

## 2016-02-15 NOTE — Discharge Instructions (Signed)
You were prescribed a medication that is potentially sedating. Do not drink alcohol, drive or participate in any other potentially dangerous activities while taking this medication as it may make you sleepy. Do not take this medication with any other sedating medications, either prescription or over-the-counter. If you were prescribed Percocet or Vicodin, do not take these with acetaminophen (Tylenol) as it is already contained within these medications.   Opioid pain medications (or "narcotics") can be habit forming.  Use it as little as possible to achieve adequate pain control.  Do not use or use it with extreme caution if you have a history of opiate abuse or dependence.  If you are on a pain contract with your primary care doctor or a pain specialist, be sure to let them know you were prescribed this medication today from the [facility] Emergency Department.  This medication is intended for your use only - do not give any to anyone else and keep it in a secure place where nobody else, especially children and pets, have access to it.  It will also cause or worsen constipation, so you may want to consider taking an over-the-counter stool softener while you are taking this medication.  Baker Cyst A Baker cyst is a sac-like structure that forms in the back of the knee. It is filled with the same fluid that is located in your knee. This fluid lubricates the bones and cartilage of the knee and allows them to move over each other more easily. CAUSES  When the knee becomes injured or inflamed, increased fluid forms in the knee. When this happens, the joint lining is pushed out behind the knee and forms the Baker cyst. This cyst may also be caused by inflammation from arthritic conditions and infections. SIGNS AND SYMPTOMS  A Baker cyst usually has no symptoms. When the cyst is substantially enlarged:  You may feel pressure behind the knee, stiffness in the knee, or a mass in the area behind the  knee.  You may develop pain, redness, and swelling in the calf. This can suggest a blood clot and requires evaluation by your health care provider. DIAGNOSIS  A Baker cyst is most often found during an ultrasound exam. This exam may have been performed for other reasons, and the cyst was found incidentally. Sometimes an MRI is used. This picks up other problems within a joint that an ultrasound exam may not. If the Baker cyst developed immediately after an injury, X-ray exams may be used to diagnose the cyst. TREATMENT  The treatment depends on the cause of the cyst. Anti-inflammatory medicines and rest often will be prescribed. If the cyst is caused by a bacterial infection, antibiotic medicines may be prescribed.  HOME CARE INSTRUCTIONS   If the cyst was caused by an injury, for the first 24 hours, keep the injured leg elevated on 2 pillows while lying down.  For the first 24 hours while you are awake, apply ice to the injured area:  Put ice in a plastic bag.  Place a towel between your skin and the bag.  Leave the ice on for 20 minutes, 2-3 times a day.  Only take over-the-counter or prescription medicines for pain, discomfort, or fever as directed by your health care provider.  Only take antibiotic medicine as directed. Make sure to finish it even if you start to feel better. MAKE SURE YOU:   Understand these instructions.  Will watch your condition.  Will get help right away if you are not  doing well or get worse.   This information is not intended to replace advice given to you by your health care provider. Make sure you discuss any questions you have with your health care provider.   Document Released: 08/23/2005 Document Revised: 06/13/2013 Document Reviewed: 04/04/2013 Elsevier Interactive Patient Education 2016 [place] therapy can help ease sore, stiff, injured, and tight muscles and joints. Heat relaxes your muscles, which may help ease your  pain.  RISKS AND COMPLICATIONS If you have any of the following conditions, do not use heat therapy unless your health care provider has approved:  Poor circulation.  Healing wounds or scarred skin in the area being treated.  Diabetes, heart disease, or high blood pressure.  Not being able to feel (numbness) the area being treated.  Unusual swelling of the area being treated.  Active infections.  Blood clots.  Cancer.  Inability to communicate pain. This may include young children and people who have problems with their brain function (dementia).  Pregnancy. Heat therapy should only be used on old, pre-existing, or long-lasting (chronic) injuries. Do not use heat therapy on new injuries unless directed by your health care provider. HOW TO USE HEAT THERAPY There are several different kinds of heat therapy, including:  Moist heat pack.  Warm water bath.  Hot water bottle.  Electric heating pad.  Heated gel pack.  Heated wrap.  Electric heating pad. Use the heat therapy method suggested by your health care provider. Follow your health care provider's instructions on when and how to use heat therapy. GENERAL HEAT THERAPY RECOMMENDATIONS  Do not sleep while using heat therapy. Only use heat therapy while you are awake.  Your skin may turn pink while using heat therapy. Do not use heat therapy if your skin turns red.  Do not use heat therapy if you have new pain.  High heat or long exposure to heat can cause burns. Be careful when using heat therapy to avoid burning your skin.  Do not use heat therapy on areas of your skin that are already irritated, such as with a rash or sunburn. SEEK MEDICAL CARE IF:  You have blisters, redness, swelling, or numbness.  You have new pain.  Your pain is worse. MAKE SURE YOU:  Understand these instructions.  Will watch your condition.  Will get help right away if you are not doing well or get worse.   This information is  not intended to replace advice given to you by your health care provider. Make sure you discuss any questions you have with your health care provider.   Document Released: 11/15/2011 Document Revised: 09/13/2014 Document Reviewed: 10/16/2013 Elsevier Interactive Patient Education [employer].

## 2016-03-10 ENCOUNTER — Inpatient Hospital Stay: Payer: Medicare Other | Attending: Internal Medicine

## 2016-03-10 ENCOUNTER — Inpatient Hospital Stay (HOSPITAL_BASED_OUTPATIENT_CLINIC_OR_DEPARTMENT_OTHER): Payer: Medicare Other | Admitting: Internal Medicine

## 2016-03-10 VITALS — BP 143/91 | HR 80 | Temp 98.0°F | Resp 18 | Wt 278.4 lb

## 2016-03-10 DIAGNOSIS — M129 Arthropathy, unspecified: Secondary | ICD-10-CM | POA: Diagnosis not present

## 2016-03-10 DIAGNOSIS — Z803 Family history of malignant neoplasm of breast: Secondary | ICD-10-CM | POA: Diagnosis not present

## 2016-03-10 DIAGNOSIS — J45909 Unspecified asthma, uncomplicated: Secondary | ICD-10-CM

## 2016-03-10 DIAGNOSIS — I82501 Chronic embolism and thrombosis of unspecified deep veins of right lower extremity: Secondary | ICD-10-CM | POA: Diagnosis not present

## 2016-03-10 DIAGNOSIS — D649 Anemia, unspecified: Secondary | ICD-10-CM

## 2016-03-10 DIAGNOSIS — Z8673 Personal history of transient ischemic attack (TIA), and cerebral infarction without residual deficits: Secondary | ICD-10-CM | POA: Diagnosis not present

## 2016-03-10 DIAGNOSIS — I82409 Acute embolism and thrombosis of unspecified deep veins of unspecified lower extremity: Secondary | ICD-10-CM

## 2016-03-10 DIAGNOSIS — Z79899 Other long term (current) drug therapy: Secondary | ICD-10-CM

## 2016-03-10 DIAGNOSIS — M479 Spondylosis, unspecified: Secondary | ICD-10-CM | POA: Diagnosis not present

## 2016-03-10 DIAGNOSIS — M7120 Synovial cyst of popliteal space [Baker], unspecified knee: Secondary | ICD-10-CM | POA: Diagnosis not present

## 2016-03-10 DIAGNOSIS — I82401 Acute embolism and thrombosis of unspecified deep veins of right lower extremity: Secondary | ICD-10-CM

## 2016-03-10 DIAGNOSIS — Z7901 Long term (current) use of anticoagulants: Secondary | ICD-10-CM

## 2016-03-10 DIAGNOSIS — E669 Obesity, unspecified: Secondary | ICD-10-CM

## 2016-03-10 DIAGNOSIS — I10 Essential (primary) hypertension: Secondary | ICD-10-CM | POA: Insufficient documentation

## 2016-03-10 DIAGNOSIS — K219 Gastro-esophageal reflux disease without esophagitis: Secondary | ICD-10-CM

## 2016-03-10 LAB — CBC WITH DIFFERENTIAL/PLATELET
Basophils Absolute: 0 10*3/uL (ref 0–0.1)
Basophils Relative: 1 %
EOS PCT: 3 %
Eosinophils Absolute: 0.1 10*3/uL (ref 0–0.7)
HCT: 35.2 % (ref 35.0–47.0)
Hemoglobin: 12.2 g/dL (ref 12.0–16.0)
LYMPHS ABS: 1.9 10*3/uL (ref 1.0–3.6)
LYMPHS PCT: 43 %
MCH: 28.1 pg (ref 26.0–34.0)
MCHC: 34.6 g/dL (ref 32.0–36.0)
MCV: 81.3 fL (ref 80.0–100.0)
MONO ABS: 0.3 10*3/uL (ref 0.2–0.9)
MONOS PCT: 8 %
Neutro Abs: 2 10*3/uL (ref 1.4–6.5)
Neutrophils Relative %: 45 %
PLATELETS: 181 10*3/uL (ref 150–440)
RBC: 4.33 MIL/uL (ref 3.80–5.20)
RDW: 17 % — AB (ref 11.5–14.5)
WBC: 4.4 10*3/uL (ref 3.6–11.0)

## 2016-03-10 LAB — PROTIME-INR
INR: 2.88
Prothrombin Time: 29.7 seconds — ABNORMAL HIGH (ref 11.4–15.0)

## 2016-03-10 NOTE — Progress Notes (Signed)
Patient states she has a Baker's cyst behind her left knee.  Pain 10/10.  BP elevated 143/91  HR. 80

## 2016-03-10 NOTE — Assessment & Plan Note (Signed)
#   Right lower extremity DVT 2; and most recently September 2016-started on Eliquis; however in March 2017 switch over to Coumadin given the stroke by neurology- Coumadin level was therapeutic at 2.8 today.; Managed by her PCP.   #In general I would have recommended anticoagulation for DVT for 6 months; however patient is currently on indefinite anticoagulation with Coumadin from her stroke perspective. Continue Coumadin for for now. Since this is being managed by her PCP;   # Baker's cyst needing further workup with orthopedics  # I left a message for patient's primary care physician [facility]- to discuss the above plan. I would not recommend any further follow-ups in the clinic unless necessary.

## 2016-03-10 NOTE — Progress Notes (Signed)
[facility] CONSULT NOTE  Patient Care Team: [facility] as PCP - General ([place]) Dionisio David, MD as Consulting Physician (Cardiology)  CHIEF COMPLAINTS/PURPOSE OF CONSULTATION:  DVT recurrent  HEMATOLOGIC HISTORY:  # ? 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 morbidly obese female with recently diagnosed right lower extremity DVT 2 most recently September 2016 that showed a right lower extremity DVT was started on Eliquis in sep 2016; but later switched to Coumadin by neurology in March 2017 because of acute stroke.  Patient was told to have a Baker's cyst; she needs orthopedic intervention. Chronic arthritic pain. No new blood clots.  ROS: No chest pain or shortness of breath or cough.   MEDICAL HISTORY:  Past Medical History  Diagnosis Date  . Reflux   . Benign essential hypertension   . Right leg DVT ([place])   . Obesity   . Lumbar spondylosis   . Anemia   . Asthma     SURGICAL HISTORY: Past Surgical History  Procedure Laterality Date  . Cholecystectomy    . Keloid excision    . Hernia repair      SOCIAL HISTORY: Social History   Social History  . Marital Status: Single    Spouse Name: N/A  . Number of Children: N/A  . Years of Education: N/A   Occupational History  . Not on file.   Social History Main Topics  . Smoking status: Never Smoker   . Smokeless tobacco: Never Used  . Alcohol Use: No  . Drug Use: No  . Sexual Activity: No   Other Topics Concern  . Not on file   Social History Narrative    FAMILY HISTORY: Family History  Problem Relation Age of Onset  . Breast cancer Sister   . Heart  disease Mother   . Congestive Heart Failure Sister     ALLERGIES:  is allergic to penicillins.  MEDICATIONS:  Current Outpatient Prescriptions  Medication Sig Dispense Refill  . albuterol (PROVENTIL HFA;VENTOLIN HFA) 108 (90 BASE) MCG/ACT inhaler Inhale 2 puffs into the lungs every 6 (six) hours as needed for wheezing or shortness of breath. 1 Inhaler 2  . Apple Cider Vinegar (APPLE CIDER VINEGAR ULTRA) 600 MG CAPS Take 1 capsule by mouth daily.    Marland Kitchen atorvastatin (LIPITOR) 40 MG tablet Take 1 tablet (40 mg total) by mouth daily at 6 PM. 30 tablet 0  . cetirizine (ZYRTEC) 10 MG tablet Take 10 mg by mouth daily.    . Cholecalciferol (D3-1000 PO) Take 1,000 Units by mouth daily.     . Cinnamon 500 MG capsule Take 500 mg by mouth daily.     . DULoxetine (CYMBALTA) 60 MG capsule Take 60 mg by mouth 2 (two) times daily.    Marland Kitchen guaiFENesin (ROBITUSSIN) 100 MG/5ML SOLN Take 5 mLs (100 mg total) by mouth every 6 (six) hours as needed for cough or to loosen phlegm. 1200 mL 0  . hydrALAZINE (APRESOLINE) 25 MG tablet Take 25 mg by mouth daily.    Marland Kitchen losartan-hydrochlorothiazide (HYZAAR) 100-25 MG tablet Take 1 tablet by mouth daily.    Marland Kitchen omeprazole (PRILOSEC) 40 MG capsule Take 1 capsule by mouth daily.    Marland Kitchen  oxyCODONE-acetaminophen (ROXICET) 5-325 MG tablet Take 1 tablet by mouth every 6 (six) hours as needed for severe pain. 10 tablet 0  . PATADAY 0.2 % SOLN Place 1 drop into both eyes daily as needed. For dry and itchy eyes.    . vitamin C (ASCORBIC ACID) 500 MG tablet Take 500 mg by mouth daily.    . vitamin E (VITAMIN E) 400 UNIT capsule Take 400 Units by mouth daily.    Marland Kitchen warfarin (COUMADIN) 5 MG tablet Take 1 tablet (5 mg total) by mouth daily at 6 PM. (Patient taking differently: Take 10 mg by mouth daily at 6 PM. ) 15 tablet 0   No current facility-administered medications for this visit.      Marland Kitchen  PHYSICAL EXAMINATION: ECOG PERFORMANCE STATUS: 1 - Symptomatic but completely  ambulatory  Filed Vitals:   03/10/16 1021  BP: 143/91  Pulse: 80  Temp: 98 F (36.7 C)  Resp: 18   Filed Weights   03/10/16 1021  Weight: 278 lb 7.1 oz (126.3 kg)    GENERAL: Well-nourished well-developed; Alert, no distress; mild discomfort because of knee pain. Morbidly obese; has difficulty getting onto the table because of habitus. EYES: no pallor or icterus OROPHARYNX: no thrush or ulceration; good dentition  NECK: supple, no masses felt LYMPH: no palpable lymphadenopathy in the cervical, axillary or inguinal regions LUNGS: clear to auscultation and No wheeze or crackles HEART/CVS: regular rate & rhythm and no murmurs; Right thigh- seems to be swollen compared to the left. No tenderness noted.  ABDOMEN: abdomen soft, non-tender and normal bowel sounds Musculoskeletal:no cyanosis of digits and no clubbing  PSYCH: alert & oriented x 3 with fluent speech NEURO: no focal motor/sensory deficits SKIN: no rashes; keloid noted on the right side of the face.  LABORATORY DATA:  I have reviewed the data as listed Lab Results  Component Value Date   WBC 4.4 03/10/2016   HGB 12.2 03/10/2016   HCT 35.2 03/10/2016   MCV 81.3 03/10/2016   PLT 181 03/10/2016    Recent Labs  11/23/15 1226 11/24/15 0415 11/28/15 2236  NA 139 139 138  K 3.4* 3.7 3.8  CL 105 108 101  CO2 31 26 31   GLUCOSE 85 108* 105*  BUN 17 14 21*  CREATININE 0.78 0.79 0.87  CALCIUM 9.3 8.7* 9.2  GFRNONAA >60 >60 >60  GFRAA >60 >60 >60  PROT  --  6.5 7.7  ALBUMIN  --  3.7 4.3  AST  --  19 46*  ALT  --  21 61*  ALKPHOS  --  57 95  BILITOT  --  0.8 0.3    RADIOGRAPHIC STUDIES: I have personally reviewed the radiological images as listed and agreed with the findings in the report. US Venous Img Lower Unilateral Left  02/15/2016  CLINICAL DATA:  Left lower extremity swelling. EXAM: LEFT LOWER EXTREMITY VENOUS DOPPLER ULTRASOUND TECHNIQUE: Gray-scale sonography with graded compression, as well as  color Doppler and duplex ultrasound were performed to evaluate the lower extremity deep venous systems from the level of the common femoral vein and including the common femoral, femoral, profunda femoral, popliteal and calf veins including the posterior tibial, peroneal and gastrocnemius veins when visible. The superficial great saphenous vein was also interrogated. Spectral Doppler was utilized to evaluate flow at rest and with distal augmentation maneuvers in the common femoral, femoral and popliteal veins. COMPARISON:  None. FINDINGS: Contralateral Common Femoral Vein: Respiratory phasicity is normal and symmetric with the symptomatic  side. No evidence of thrombus. Normal compressibility. Common Femoral Vein: No evidence of thrombus. Normal compressibility, respiratory phasicity and response to augmentation. Saphenofemoral Junction: No evidence of thrombus. Normal compressibility and flow on color Doppler imaging. Profunda Femoral Vein: No evidence of thrombus. Normal compressibility and flow on color Doppler imaging. Femoral Vein: No evidence of thrombus. Normal compressibility, respiratory phasicity and response to augmentation. Popliteal Vein: No evidence of thrombus. Normal compressibility, respiratory phasicity and response to augmentation. Calf Veins: No evidence of thrombus. Normal compressibility and flow on color Doppler imaging. Superficial Great Saphenous Vein: No evidence of thrombus. Normal compressibility and flow on color Doppler imaging. Venous Reflux:  None. Other Findings:  None. IMPRESSION: No evidence of deep venous thrombosis. Electronically Signed   By: Dorise Bullion III M.D   On: 02/15/2016 11:27    ASSESSMENT & PLAN:   DVT, lower extremity, recurrent ([place]) # Right lower extremity DVT 2; and most recently September 2016-started on Eliquis; however in March 2017 switch over to Coumadin given the stroke by neurology- Coumadin level was therapeutic at 2.8 today.; Managed by her PCP.    #In general I would have recommended anticoagulation for DVT for 6 months; however patient is currently on indefinite anticoagulation with Coumadin from her stroke perspective. Continue Coumadin for for now. Since this is being managed by her PCP;   # Baker's cyst needing further workup with orthopedics  # I left a message for patient's primary care physician [facility]- to discuss the above plan. I would not recommend any further follow-ups in the clinic unless necessary.      Cammie Sickle, MD 03/10/2016 6:48 PM

## 2016-03-22 ENCOUNTER — Other Ambulatory Visit: Payer: Self-pay | Admitting: Specialist

## 2016-03-22 DIAGNOSIS — M1712 Unilateral primary osteoarthritis, left knee: Secondary | ICD-10-CM

## 2016-04-02 ENCOUNTER — Ambulatory Visit
Admission: RE | Admit: 2016-04-02 | Discharge: 2016-04-02 | Disposition: A | Discharge status: Home / Self Care | Payer: Medicare Other | Source: Ambulatory Visit | Attending: Specialist | Admitting: Specialist

## 2016-04-02 DIAGNOSIS — X58XXXA Exposure to other specified factors, initial encounter: Secondary | ICD-10-CM | POA: Diagnosis not present

## 2016-04-02 DIAGNOSIS — M25462 Effusion, left knee: Secondary | ICD-10-CM | POA: Insufficient documentation

## 2016-04-02 DIAGNOSIS — M1712 Unilateral primary osteoarthritis, left knee: Secondary | ICD-10-CM

## 2016-04-02 DIAGNOSIS — S83242A Other tear of medial meniscus, current injury, left knee, initial encounter: Secondary | ICD-10-CM | POA: Diagnosis not present

## 2016-04-03 ENCOUNTER — Emergency Department
Admission: EM | Admit: 2016-04-03 | Discharge: 2016-04-03 | Disposition: A | Discharge status: Home / Self Care | Payer: Medicare Other | Attending: Emergency Medicine | Admitting: Emergency Medicine

## 2016-04-03 ENCOUNTER — Encounter: Payer: Self-pay | Admitting: Emergency Medicine

## 2016-04-03 DIAGNOSIS — Z7951 Long term (current) use of inhaled steroids: Secondary | ICD-10-CM | POA: Insufficient documentation

## 2016-04-03 DIAGNOSIS — R51 Headache: Secondary | ICD-10-CM | POA: Insufficient documentation

## 2016-04-03 DIAGNOSIS — J45909 Unspecified asthma, uncomplicated: Secondary | ICD-10-CM | POA: Diagnosis not present

## 2016-04-03 DIAGNOSIS — Z7901 Long term (current) use of anticoagulants: Secondary | ICD-10-CM | POA: Diagnosis not present

## 2016-04-03 DIAGNOSIS — I8290 Acute embolism and thrombosis of unspecified vein: Secondary | ICD-10-CM

## 2016-04-03 DIAGNOSIS — I10 Essential (primary) hypertension: Secondary | ICD-10-CM | POA: Insufficient documentation

## 2016-04-03 DIAGNOSIS — G43909 Migraine, unspecified, not intractable, without status migrainosus: Secondary | ICD-10-CM | POA: Diagnosis present

## 2016-04-03 DIAGNOSIS — R519 Headache, unspecified: Secondary | ICD-10-CM

## 2016-04-03 LAB — PROTIME-INR
INR: 2.27
PROTHROMBIN TIME: 25.4 s — AB (ref 11.4–15.2)

## 2016-04-03 MED ORDER — ACETAMINOPHEN 500 MG PO TABS
1000.0000 mg | ORAL_TABLET | Freq: Once | ORAL | Status: AC
  Administered 2016-04-03: 1000 mg via ORAL
  Filled 2016-04-03: qty 2

## 2016-04-03 NOTE — ED Provider Notes (Addendum)
[facility] Emergency Department Provider Note  ____________________________________________  Time seen: Approximately 11:26 AM  I have reviewed the triage vital signs and the nursing notes.   HISTORY  Chief Complaint Migraine   HPI Lindsay Cooke is a 55 y.o. female a history of anemia, hypertension, asthma, DVT on Coumadin who presents for evaluation of blood clot in her mouth. Patient reports that she woke up this morning had 2 very small blood clots in her mouth. She was concerned because she saw her primary care doctor yesterday and her INR was supratherapeutic. She was told to hold her evening dose of Coumadin last night which she did. She denies hemoptysis, hematemesis, trauma to her mouth, or any recent dental procedures. She reports that she woke up with these blood clots in her mouth and hasn't had any more bleeding since then. She also endorses a moderate frontal headache that she has had on a daily basis for over 2 years. She reports that she has been seen by her primary care doctor multiple times for this complaint. She was also seen by her eye doctor a month ago and has received a new prescription for her glasses. She reports a history of migraines and her current headache is similar to her other headaches. She denies vomiting, changes in vision, facial droop, numbness or weakness of her extremities. She reports that her headache at this 5/10.She has the 2 blood clots with her in a bag and they are ~ 0.5cm each for a total of 2.  Past Medical History:  Diagnosis Date  . Anemia   . Asthma   . Benign essential hypertension   . Lumbar spondylosis   . Obesity   . Reflux   . Right leg DVT [facility])     Patient Active Problem List   Diagnosis Date Noted  . CVA (cerebral vascular accident) ([place]) 11/23/2015  . Hemoptysis 11/23/2015  . 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 FATIGUE 10/03/2008  . SNORING 10/03/2008  . GUAIAC POSITIVE STOOL  05/07/2008  . GASTROENTERITIS 02/23/2008  . DEPRESSION 11/27/2007  . MEDIAL MENISCUS TEAR, RIGHT 11/20/2007  . ANEMIA 11/15/2007  . OSTEOARTHRITIS, KNEE 11/15/2007  . GERD 09/06/2007  . OBESITY 07/21/2007  . HYPERTENSION 07/21/2007  . ASTHMA 07/21/2007  . KELOID 07/21/2007    Past Surgical History:  Procedure Laterality Date  . CHOLECYSTECTOMY    . HERNIA REPAIR    . KELOID EXCISION      Prior to Admission medications   Medication Sig Start Date End Date Taking? Authorizing Provider  albuterol (PROVENTIL HFA;VENTOLIN HFA) 108 (90 BASE) MCG/ACT inhaler Inhale 2 puffs into the lungs every 6 (six) hours as needed for wheezing or shortness of breath. 02/22/15  Yes Johnn Hai, PA-C  Apple Cider Vinegar (APPLE CIDER VINEGAR ULTRA) 600 MG CAPS Take 1 capsule by mouth daily.   Yes Historical Provider, MD  atorvastatin (LIPITOR) 40 MG tablet Take 1 tablet (40 mg total) by mouth daily at 6 PM. 11/25/15  Yes Vaughan Basta, MD  cetirizine (ZYRTEC) 10 MG tablet Take 10 mg by mouth daily.   Yes Historical Provider, MD  Cinnamon 500 MG capsule Take 500 mg by mouth daily.    Yes Historical Provider, MD  DULoxetine (CYMBALTA) 60 MG capsule Take 60 mg by mouth 2 (two) times daily.   Yes Historical Provider, MD  ferrous sulfate 325 (65 FE) MG EC tablet Take 325 mg by mouth daily.   Yes Historical Provider, MD  guaiFENesin (ROBITUSSIN) 100 MG/5ML SOLN Take 5 mLs (100 mg total) by mouth every 6 (six) hours as needed for cough or to loosen phlegm. 11/25/15  Yes Vaughan Basta, MD  hydrALAZINE (APRESOLINE) 25 MG tablet Take 25 mg by mouth daily. 04/25/15  Yes Historical Provider, MD  losartan-hydrochlorothiazide (HYZAAR) 100-25 MG tablet Take 1 tablet by mouth daily. 04/25/15  Yes Historical Provider, MD  omeprazole (PRILOSEC) 40 MG capsule Take 1 capsule by mouth daily. 05/01/15  Yes Historical Provider, MD  vitamin E (VITAMIN E) 400 UNIT capsule Take 400 Units by mouth daily.   Yes  Historical Provider, MD  warfarin (COUMADIN) 6 MG tablet Take 6 mg by mouth daily. (Pt has not taken, must be picked up from Pharmacy)   Yes Historical Provider, MD  Cholecalciferol (D3-1000 PO) Take 1,000 Units by mouth daily.     Historical Provider, MD    Allergies Penicillins  Family History  Problem Relation Age of Onset  . Breast cancer Sister   . Heart disease Mother   . Congestive Heart Failure Sister     Social History Social History  Substance Use Topics  . Smoking status: Never Smoker  . Smokeless tobacco: Never Used  . Alcohol use No    Review of Systems  Constitutional: Negative for fever. Eyes: Negative for visual changes. ENT: Negative for sore throat. + blood clot in her mouth Cardiovascular: Negative for chest pain. Respiratory: Negative for shortness of breath. Gastrointestinal: Negative for abdominal pain, vomiting or diarrhea. Genitourinary: Negative for dysuria. Musculoskeletal: Negative for back pain. Skin: Negative for rash. Neurological: Negative for weakness or numbness. + HA  ____________________________________________   PHYSICAL EXAM:  VITAL SIGNS: ED Triage Vitals  Enc Vitals Group     BP 04/03/16 1103 126/101     Pulse Rate 04/03/16 1101 73     Resp 04/03/16 1101 16     Temp 04/03/16 1101 98 F (36.7 C)     Temp Source 04/03/16 1101 Oral     SpO2 04/03/16 1101 100 %     Weight 04/03/16 1101 278 lb (126.1 kg)     Height 04/03/16 1101 5\' 1"  (1.549 m)     Head Circumference --      Peak Flow --      Pain Score 04/03/16 1101 8     Pain Loc --      Pain Edu? --      Excl. in [place]? --     Constitutional: Alert and oriented. Well appearing and in no apparent distress. HEENT:      Head: Normocephalic and atraumatic.         Eyes: Conjunctivae are normal. Sclera is non-icteric. EOMI. PERRL      Mouth/Throat: Mucous membranes are moist. No evidence of trauma or active bleeding. Gums look normal, tongue looks normal, oropharynx is  clear.      Neck: Supple with no signs of meningismus. Cardiovascular: Regular rate and rhythm. No murmurs, gallops, or rubs. 2+ symmetrical distal pulses are present in all extremities. No JVD. Respiratory: Normal respiratory effort. Lungs are clear to auscultation bilaterally. No wheezes, crackles, or rhonchi.  Gastrointestinal: Soft, non tender, and non distended with positive bowel sounds. No rebound or guarding. Genitourinary: No CVA tenderness. Musculoskeletal: Nontender with normal range of motion in all extremities. No edema, cyanosis, or erythema of extremities. Neurologic: Normal speech and language. Face is symmetric. Moving all extremities. Normal gait. No pronator drift. No gross focal neurologic deficits are appreciated. Skin: Skin is  warm, dry and intact. No rash noted. Psychiatric: Mood and affect are normal. Speech and behavior are normal.  ____________________________________________   LABS (all labs ordered are listed, but only abnormal results are displayed)  Labs Reviewed  PROTIME-INR - Abnormal; Notable for the following:       Result Value   Prothrombin Time 25.4 (*)    All other components within normal limits   ____________________________________________  EKG  none ____________________________________________  RADIOLOGY  none  ____________________________________________   PROCEDURES  Procedure(s) performed: None Procedures Critical Care performed:  None ____________________________________________   INITIAL IMPRESSION / ASSESSMENT AND PLAN / ED COURSE  55 y.o. female a history of anemia, hypertension, asthma, DVT on Coumadin who presents for evaluation of two small blood clots in her mouth when she woke up this morning. Clots are extremely small, no evidence of trauma in her mouth, she has had no hemoptysis, no chest pain, no tachycardia, no tachypnea and has report compliance with her Coumadin making my suspicion that this is PE extremely low.  We'll observe on telemetry, recheck her INR.  Patient has initial VS showing hypotension with BP 82/63 however BP cuff was on patient's elbow and not arm. Cuff was fixed and BP retake with value of 126/101   #HA: Low suspicion for more serious or life threatening etiology of HA based on history and exam. No sudden onset thunderclap HA, onset with exertion, vomiting, focal neurologic deficits, to suggest increased risk of subarachnoid hemorrhage. No fever, neck pain, neck stiffness, or meningismus on exam to suggest meningitis. No fevers, altered mental status, unusual behavior to suggest encephalitis. No focal neurologic deficits by history or exam to suggest central venous thrombosis. No constitutional symptoms including fever, fatigue, weight loss, temporal scalp tenderness, jaw claudication, visual loss, to suggest temporal arteritis. No immunocompromise to suggest increased risk for intracranial infectious disease. No visual changes or findings on ocular exam to suggest acute angle closure glaucoma. No reports of toxic exposures including carbon monoxide or other household members with similar symptoms. Will treat with tylenol.    Clinical Course  Comment By Time  HA resolved with tylenol. Patient remains neurologically intact. No further clots in the ED, no hemoptysis, no hematemesis. INR is 2.27 and I instructed patient to re-start coumadin as recommended by her doctor. Also instructed patient to return if she notices any blood on her sputum, emesis, or stool. Rudene Re, MD 07/29 1314    Pertinent labs & imaging results that were available during my care of the patient were reviewed by me and considered in my medical decision making (see chart for details).    ____________________________________________   FINAL CLINICAL IMPRESSION(S) / ED DIAGNOSES  Final diagnoses:  Acute nonintractable headache, unspecified headache type  Clot      NEW MEDICATIONS STARTED DURING THIS  VISIT:  New Prescriptions   No medications on file     Note:  This document was prepared using Dragon voice recognition software and may include unintentional dictation errors.    Rudene Re, MD 04/03/16 [place], MD 04/03/16 1316

## 2016-04-03 NOTE — Discharge Instructions (Signed)
You may re-start your coumadin as recommended by your doctor today. Follow-up with your doctor on your appointment Monday. Return if you have any more blood clots in your mouth, sputum, if you vomit blood, if you notice blood in your stool, or any new symptoms concerning to you

## 2016-04-03 NOTE — ED Triage Notes (Signed)
Pt reports waking up with blood in her mouth and headache.  No neuro deficits noted at this time.

## 2016-04-03 NOTE — ED Notes (Signed)
Pt reports awakening with blood in her mouth this am - 2 clots on a tissue brought to ED by pt   She take coumadin qd and yesterday her INR was elevated  Specimen for INR here has been sent to lab  10/10 headache pain reported by pt  1g acetaminophen administered as ordered  Continue to monitor

## 2016-04-07 DIAGNOSIS — K649 Unspecified hemorrhoids: Secondary | ICD-10-CM | POA: Insufficient documentation

## 2016-04-30 ENCOUNTER — Other Ambulatory Visit: Payer: Self-pay | Admitting: Internal Medicine

## 2016-04-30 DIAGNOSIS — Z1231 Encounter for screening mammogram for malignant neoplasm of breast: Secondary | ICD-10-CM

## 2016-05-31 ENCOUNTER — Ambulatory Visit
Admission: RE | Admit: 2016-05-31 | Discharge: 2016-05-31 | Disposition: A | Discharge status: Home / Self Care | Payer: Medicare Other | Source: Ambulatory Visit | Attending: Internal Medicine | Admitting: Internal Medicine

## 2016-05-31 DIAGNOSIS — Z1231 Encounter for screening mammogram for malignant neoplasm of breast: Secondary | ICD-10-CM | POA: Insufficient documentation

## 2016-06-10 DIAGNOSIS — E559 Vitamin D deficiency, unspecified: Secondary | ICD-10-CM | POA: Insufficient documentation

## 2016-06-30 ENCOUNTER — Emergency Department: Payer: Medicare Other

## 2016-06-30 ENCOUNTER — Encounter: Payer: Self-pay | Admitting: Emergency Medicine

## 2016-06-30 ENCOUNTER — Emergency Department
Admission: EM | Admit: 2016-06-30 | Discharge: 2016-06-30 | Disposition: A | Discharge status: Home / Self Care | Payer: Medicare Other | Attending: Emergency Medicine | Admitting: Emergency Medicine

## 2016-06-30 DIAGNOSIS — Z7901 Long term (current) use of anticoagulants: Secondary | ICD-10-CM | POA: Diagnosis not present

## 2016-06-30 DIAGNOSIS — Z79899 Other long term (current) drug therapy: Secondary | ICD-10-CM | POA: Diagnosis not present

## 2016-06-30 DIAGNOSIS — J069 Acute upper respiratory infection, unspecified: Secondary | ICD-10-CM | POA: Diagnosis not present

## 2016-06-30 DIAGNOSIS — I10 Essential (primary) hypertension: Secondary | ICD-10-CM | POA: Insufficient documentation

## 2016-06-30 DIAGNOSIS — R202 Paresthesia of skin: Secondary | ICD-10-CM | POA: Diagnosis not present

## 2016-06-30 DIAGNOSIS — R079 Chest pain, unspecified: Secondary | ICD-10-CM | POA: Diagnosis present

## 2016-06-30 DIAGNOSIS — J45909 Unspecified asthma, uncomplicated: Secondary | ICD-10-CM | POA: Insufficient documentation

## 2016-06-30 LAB — PROTIME-INR
INR: 2.23
PROTHROMBIN TIME: 25.1 s — AB (ref 11.4–15.2)

## 2016-06-30 LAB — BASIC METABOLIC PANEL
ANION GAP: 8 (ref 5–15)
BUN: 13 mg/dL (ref 6–20)
CALCIUM: 9.4 mg/dL (ref 8.9–10.3)
CO2: 29 mmol/L (ref 22–32)
Chloride: 103 mmol/L (ref 101–111)
Creatinine, Ser: 0.94 mg/dL (ref 0.44–1.00)
Glucose, Bld: 95 mg/dL (ref 65–99)
Potassium: 3.7 mmol/L (ref 3.5–5.1)
SODIUM: 140 mmol/L (ref 135–145)

## 2016-06-30 LAB — CBC
HCT: 37.8 % (ref 35.0–47.0)
HEMOGLOBIN: 13 g/dL (ref 12.0–16.0)
MCH: 28.5 pg (ref 26.0–34.0)
MCHC: 34.3 g/dL (ref 32.0–36.0)
MCV: 83.1 fL (ref 80.0–100.0)
Platelets: 226 10*3/uL (ref 150–440)
RBC: 4.56 MIL/uL (ref 3.80–5.20)
RDW: 17 % — ABNORMAL HIGH (ref 11.5–14.5)
WBC: 5 10*3/uL (ref 3.6–11.0)

## 2016-06-30 LAB — TROPONIN I

## 2016-06-30 MED ORDER — ALBUTEROL SULFATE (2.5 MG/3ML) 0.083% IN NEBU
2.5000 mg | INHALATION_SOLUTION | Freq: Once | RESPIRATORY_TRACT | Status: AC
  Administered 2016-06-30: 2.5 mg via RESPIRATORY_TRACT
  Filled 2016-06-30: qty 3

## 2016-06-30 MED ORDER — ALBUTEROL SULFATE HFA 108 (90 BASE) MCG/ACT IN AERS: 2.0000 | INHALATION_SPRAY | Freq: Four times a day (QID) | RESPIRATORY_TRACT | 0 refills | Status: DC | PRN

## 2016-06-30 NOTE — ED Triage Notes (Signed)
Pt reports chest pain x1 week, central chest. Pt reports cough x1 day with cold chills.

## 2016-06-30 NOTE — ED Provider Notes (Signed)
[facility] Emergency Department Provider Note  ____________________________________________   First MD Initiated Contact with Patient 06/30/16 1548     (approximate)  I have reviewed the triage vital signs and the nursing notes.   HISTORY  Chief Complaint Chest Pain   HPI Lindsay Cooke is a 55 y.o. female with a history of right lower summary DVT as well as hypertension is present. Emergency department today with 1 week of intermittent chest pain as well as bilateral hand and finger numbness. She says that today she also began coughing but denies any fever, runny nose or sputum production. Denies any nausea vomiting or diarrhea. Says that herchest pain lasts for 10-15 minutes and is worse in the morning. She says that it also is worsened with her coughing. She denies any shortness of breath. No nausea vomiting or diarrhea. No diaphoresis. No radiation of the pain. Denies any chest pain at this time. Also denies numbness at this time.   Past Medical History:  Diagnosis Date  . Anemia   . Asthma   . Benign essential hypertension   . Lumbar spondylosis   . Obesity   . Reflux   . Right leg DVT [facility])     Patient Active Problem List   Diagnosis Date Noted  . CVA (cerebral vascular accident) ([place]) 11/23/2015  . Hemoptysis 11/23/2015  . 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 FATIGUE 10/03/2008  . SNORING 10/03/2008  . GUAIAC POSITIVE STOOL 05/07/2008  . GASTROENTERITIS 02/23/2008  . DEPRESSION 11/27/2007  . MEDIAL MENISCUS TEAR, RIGHT 11/20/2007  . ANEMIA 11/15/2007  . OSTEOARTHRITIS, KNEE 11/15/2007  . GERD 09/06/2007  . OBESITY 07/21/2007  . HYPERTENSION 07/21/2007  . ASTHMA 07/21/2007  . KELOID 07/21/2007    Past Surgical History:  Procedure Laterality Date  . CHOLECYSTECTOMY    . HERNIA REPAIR    . KELOID EXCISION      Prior to Admission medications   Medication Sig Start Date End Date Taking? Authorizing Provider    albuterol (PROVENTIL HFA;VENTOLIN HFA) 108 (90 BASE) MCG/ACT inhaler Inhale 2 puffs into the lungs every 6 (six) hours as needed for wheezing or shortness of breath. 02/22/15   Johnn Hai, PA-C  Apple Cider Vinegar (APPLE CIDER VINEGAR ULTRA) 600 MG CAPS Take 1 capsule by mouth daily.    Historical Provider, MD  atorvastatin (LIPITOR) 40 MG tablet Take 1 tablet (40 mg total) by mouth daily at 6 PM. 11/25/15   Vaughan Basta, MD  cetirizine (ZYRTEC) 10 MG tablet Take 10 mg by mouth daily.    Historical Provider, MD  Cholecalciferol (D3-1000 PO) Take 1,000 Units by mouth daily.     Historical Provider, MD  Cinnamon 500 MG capsule Take 500 mg by mouth daily.     Historical Provider, MD  DULoxetine (CYMBALTA) 60 MG capsule Take 60 mg by mouth 2 (two) times daily.    Historical Provider, MD  ferrous sulfate 325 (65 FE) MG EC tablet Take 325 mg by mouth daily.    Historical Provider, MD  guaiFENesin (ROBITUSSIN) 100 MG/5ML SOLN Take 5 mLs (100 mg total) by mouth every 6 (six) hours as needed for cough or to loosen phlegm. 11/25/15   Vaughan Basta, MD  hydrALAZINE (APRESOLINE) 25 MG tablet Take 25 mg by mouth daily. 04/25/15   Historical Provider, MD  losartan-hydrochlorothiazide (HYZAAR) 100-25 MG tablet Take 1 tablet by mouth daily. 04/25/15   Historical Provider, MD  omeprazole (PRILOSEC) 40 MG capsule Take 1 capsule  by mouth daily. 05/01/15   Historical Provider, MD  vitamin E (VITAMIN E) 400 UNIT capsule Take 400 Units by mouth daily.    Historical Provider, MD  warfarin (COUMADIN) 6 MG tablet Take 6 mg by mouth daily. (Pt has not taken, must be picked up from Pharmacy)    Historical Provider, MD    Allergies Penicillins  Family History  Problem Relation Age of Onset  . Breast cancer Sister   . Heart disease Mother   . Congestive Heart Failure Sister     Social History Social History  Substance Use Topics  . Smoking status: Never Smoker  . Smokeless tobacco: Never Used   . Alcohol use No    Review of Systems Constitutional: No fever/chills Eyes: No visual changes. ENT: No sore throat. Cardiovascular: As above Respiratory: As above Gastrointestinal: No abdominal pain.  No nausea, no vomiting.  No diarrhea.  No constipation. Genitourinary: Negative for dysuria. Musculoskeletal: Negative for back pain. Skin: Negative for rash. Neurological: Negative for headaches, focal weakness  10-point ROS otherwise negative.  ____________________________________________   PHYSICAL EXAM:  VITAL SIGNS: ED Triage Vitals [06/30/16 1410]  Enc Vitals Group     BP 124/81     Pulse Rate 84     Resp 20     Temp 98.4 F (36.9 C)     Temp Source Oral     SpO2 100 %     Weight 290 lb (131.5 kg)     Height 5\' 1"  (1.549 m)     Head Circumference      Peak Flow      Pain Score 8     Pain Loc      Pain Edu?      Excl. in [place]?     Constitutional: Alert and oriented. Well appearing and in no acute distress. Eyes: Conjunctivae are normal. PERRL. EOMI. Head: Atraumatic. Nose: No congestion/rhinnorhea. Mouth/Throat: Mucous membranes are moist.  Oropharynx non-erythematous. Neck: No stridor.   Cardiovascular: Normal rate, regular rhythm. Grossly normal heart sounds.  Good peripheral circulation With intact, equal bilateral radial pulses. Chest pain is reproducible palpation over the left sternal border. Respiratory: Normal respiratory effort.  No retractions. Lungs CTAB. Gastrointestinal: Soft and nontender. No distention. Musculoskeletal: No lower extremity tenderness nor edema.  No joint effusions. Neurologic:  Normal speech and language. No gross focal neurologic deficits are appreciated.  Skin:  Skin is warm, dry and intact. No rash noted. Psychiatric: Mood and affect are normal. Speech and behavior are normal.  ____________________________________________   LABS (all labs ordered are listed, but only abnormal results are displayed)  Labs Reviewed  CBC -  Abnormal; Notable for the following:       Result Value   RDW 17.0 (*)    All other components within normal limits  PROTIME-INR - Abnormal; Notable for the following:    Prothrombin Time 25.1 (*)    All other components within normal limits  BASIC METABOLIC PANEL  TROPONIN I   ____________________________________________  EKG  ED ECG REPORT I, Doran Stabler, the attending physician, personally viewed and interpreted this ECG.   Date: 06/30/2016  EKG Time: 1407  Rate: 88  Rhythm: normal sinus rhythm  Axis: Normal  Intervals incomplete right bundle-branch block  ST&T Change: no ST elevation or depression. No abnormal T-wave inversion.  ____________________________________________  RADIOLOGY  DG Chest 2 View (Accession [id number]) (Order [id number])  Imaging  Date: 06/30/2016 Department: [facility] EMERGENCY DEPARTMENT Released By: Joellen Jersey  Asher Muir, RN (auto-released) Authorizing: Orbie Pyo, MD  Exam Information   Status Exam Begun  Exam Ended   Final ----------------------------------------- 5:29 PM on 06/30/2016 -----------------------------------------   06/30/2016 2:25 PM 06/30/2016 2:29 PM  PACS Images   Show images for DG Chest 2 View  Study Result   CLINICAL DATA:  Chest pain for 1 week and cough for 2 days.  Asthma.  EXAM: CHEST  2 VIEW  COMPARISON:  02/22/2015  FINDINGS: Thoracic spondylosis. Mild to moderate enlargement of the cardiopericardial silhouette, without edema. No pleural effusion. No airspace opacity identified. No overt airway thickening.  IMPRESSION: 1. Mild-to-moderate enlargement of the cardiopericardial silhouette, without edema. 2. Thoracic spondylosis.   Electronically Signed   By: Van Clines M.D.   On: 06/30/2016 14:32     ____________________________________________   PROCEDURES  Procedure(s) performed:   Procedures  Critical Care performed:    ____________________________________________   INITIAL IMPRESSION / ASSESSMENT AND PLAN / ED COURSE  Pertinent labs & imaging results that were available during my care of the patient were reviewed by me and considered in my medical decision making (see chart for details).    Clinical Course   ----------------------------------------- 5:29 PM on 06/30/2016 -----------------------------------------   patient says that she feels better after her breathing treatment and is no longer coughing. Still without any chest pain or numbness to the hands bilaterally. Says that she is a history of carpal tunnel syndrome. Because the symptoms are bilateral and intermittent and without any associated weakness though it is more likely that this is related to peripheral nerve issue.  Only intermittent chest pain which is reproducible palpation and an associated cough. Likely chest wall pain. She has already made an appointment with her cardiologist, Dr. Humphrey Rolls, tomorrow. She will follow-up with her primary care doctor for the ongoing numbness in her hands. We'll discharge with for symptomatic relief from the cough. Similar EKGs on the record from that taken today. Undetectable troponin after 1 week of symptoms. Unlikely to be ACS. Unlikely to be pulmonary embolus because of only intermittent symptoms without any shortness of breath.    Point of care bedside ultrasound done without any pericardial effusion visualized on the subxiphoid as well as the parasternal views. ____________________________________________   FINAL CLINICAL IMPRESSION(S) / ED DIAGNOSES   Chest pain. Upper respiratory infection.   NEW MEDICATIONS STARTED DURING THIS VISIT:  New Prescriptions   No medications on file     Note:  This document was prepared using Dragon voice recognition software and may include unintentional dictation errors.    Orbie Pyo, MD 06/30/16 [phone]

## 2016-07-01 DIAGNOSIS — R079 Chest pain, unspecified: Secondary | ICD-10-CM | POA: Insufficient documentation

## 2016-07-08 DIAGNOSIS — R0602 Shortness of breath: Secondary | ICD-10-CM | POA: Insufficient documentation

## 2016-07-20 DIAGNOSIS — E785 Hyperlipidemia, unspecified: Secondary | ICD-10-CM | POA: Insufficient documentation

## 2016-08-20 DIAGNOSIS — Z8249 Family history of ischemic heart disease and other diseases of the circulatory system: Secondary | ICD-10-CM | POA: Insufficient documentation

## 2016-09-10 ENCOUNTER — Emergency Department
Admission: EM | Admit: 2016-09-10 | Discharge: 2016-09-10 | Disposition: A | Discharge status: Home / Self Care | Payer: Medicare Other | Attending: Emergency Medicine | Admitting: Emergency Medicine

## 2016-09-10 ENCOUNTER — Encounter: Payer: Self-pay | Admitting: Emergency Medicine

## 2016-09-10 ENCOUNTER — Emergency Department: Payer: Medicare Other

## 2016-09-10 DIAGNOSIS — Z79899 Other long term (current) drug therapy: Secondary | ICD-10-CM | POA: Insufficient documentation

## 2016-09-10 DIAGNOSIS — M25561 Pain in right knee: Secondary | ICD-10-CM | POA: Diagnosis not present

## 2016-09-10 DIAGNOSIS — R042 Hemoptysis: Secondary | ICD-10-CM | POA: Insufficient documentation

## 2016-09-10 DIAGNOSIS — J45909 Unspecified asthma, uncomplicated: Secondary | ICD-10-CM | POA: Diagnosis not present

## 2016-09-10 DIAGNOSIS — I10 Essential (primary) hypertension: Secondary | ICD-10-CM | POA: Insufficient documentation

## 2016-09-10 DIAGNOSIS — R05 Cough: Secondary | ICD-10-CM | POA: Diagnosis present

## 2016-09-10 LAB — CBC WITH DIFFERENTIAL/PLATELET
Basophils Absolute: 0.1 10*3/uL (ref 0–0.1)
Basophils Relative: 2 %
Eosinophils Absolute: 0.3 10*3/uL (ref 0–0.7)
Eosinophils Relative: 5 %
HEMATOCRIT: 36.6 % (ref 35.0–47.0)
HEMOGLOBIN: 12.8 g/dL (ref 12.0–16.0)
LYMPHS ABS: 2.8 10*3/uL (ref 1.0–3.6)
LYMPHS PCT: 49 %
MCH: 28.8 pg (ref 26.0–34.0)
MCHC: 35 g/dL (ref 32.0–36.0)
MCV: 82.2 fL (ref 80.0–100.0)
MONO ABS: 0.4 10*3/uL (ref 0.2–0.9)
MONOS PCT: 8 %
NEUTROS ABS: 2.1 10*3/uL (ref 1.4–6.5)
Neutrophils Relative %: 36 %
Platelets: 222 10*3/uL (ref 150–440)
RBC: 4.45 MIL/uL (ref 3.80–5.20)
RDW: 16.5 % — ABNORMAL HIGH (ref 11.5–14.5)
WBC: 5.7 10*3/uL (ref 3.6–11.0)

## 2016-09-10 LAB — BASIC METABOLIC PANEL
ANION GAP: 3 — AB (ref 5–15)
BUN: 14 mg/dL (ref 6–20)
CALCIUM: 9.5 mg/dL (ref 8.9–10.3)
CHLORIDE: 104 mmol/L (ref 101–111)
CO2: 32 mmol/L (ref 22–32)
CREATININE: 0.87 mg/dL (ref 0.44–1.00)
GFR calc Af Amer: >60 mL/min (ref >60)
GFR calc non Af Amer: >60 mL/min (ref >60)
GLUCOSE: 85 mg/dL (ref 65–99)
Potassium: 3.6 mmol/L (ref 3.5–5.1)
Sodium: 139 mmol/L (ref 135–145)

## 2016-09-10 LAB — PROTIME-INR
INR: 3.16
PROTHROMBIN TIME: 33.1 s — AB (ref 11.4–15.2)

## 2016-09-10 NOTE — ED Provider Notes (Signed)
[facility] Emergency Department Provider Note   ____________________________________________   First MD Initiated Contact with Patient 09/10/16 1819     (approximate)  I have reviewed the triage vital signs and the nursing notes.   HISTORY  Chief Complaint Hemoptysis and Knee Pain    HPI Lindsay Cooke is a 56 y.o. female previous history of DVT. Currently on Coumadin. They reports for the last year she is occasionally have a small amount of blood when she coughs. This morning she was coughing and she noticed a very small amount of blood in her sputum. She is currently on warfarin. She has not had any chest pain or shortness of breath. She also reports that she has pain in her right knee, but this is been ongoing issue for years. She has had injections, and other treatments for this and reports this to be a chronic issue. She has not noticed any new leg swelling. No numbness tingling or weakness in legs. She is able to walk without difficulty except for pain in the right knee. No redness or swelling. No fever   Past Medical History:  Diagnosis Date  . Anemia   . Asthma   . Benign essential hypertension   . Lumbar spondylosis   . Obesity   . Reflux   . Right leg DVT [facility])     Patient Active Problem List   Diagnosis Date Noted  . CVA (cerebral vascular accident) ([place]) 11/23/2015  . Hemoptysis 11/23/2015  . 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 FATIGUE 10/03/2008  . SNORING 10/03/2008  . GUAIAC POSITIVE STOOL 05/07/2008  . GASTROENTERITIS 02/23/2008  . DEPRESSION 11/27/2007  . MEDIAL MENISCUS TEAR, RIGHT 11/20/2007  . ANEMIA 11/15/2007  . OSTEOARTHRITIS, KNEE 11/15/2007  . GERD 09/06/2007  . OBESITY 07/21/2007  . HYPERTENSION 07/21/2007  . ASTHMA 07/21/2007  . KELOID 07/21/2007    Past Surgical History:  Procedure Laterality Date  . CHOLECYSTECTOMY    . HERNIA REPAIR    . KELOID EXCISION      Prior to Admission  medications   Medication Sig Start Date End Date Taking? Authorizing Provider  albuterol (PROVENTIL HFA;VENTOLIN HFA) 108 (90 Base) MCG/ACT inhaler Inhale 2 puffs into the lungs every 6 (six) hours as needed for wheezing or shortness of breath. 06/30/16   Orbie Pyo, MD  Apple Cider Vinegar (APPLE CIDER VINEGAR ULTRA) 600 MG CAPS Take 1 capsule by mouth daily.    Historical Provider, MD  atorvastatin (LIPITOR) 40 MG tablet Take 1 tablet (40 mg total) by mouth daily at 6 PM. 11/25/15   Vaughan Basta, MD  cetirizine (ZYRTEC) 10 MG tablet Take 10 mg by mouth daily.    Historical Provider, MD  Cholecalciferol (D3-1000 PO) Take 1,000 Units by mouth daily.     Historical Provider, MD  Cinnamon 500 MG capsule Take 500 mg by mouth daily.     Historical Provider, MD  DULoxetine (CYMBALTA) 60 MG capsule Take 60 mg by mouth 2 (two) times daily.    Historical Provider, MD  ferrous sulfate 325 (65 FE) MG EC tablet Take 325 mg by mouth daily.    Historical Provider, MD  guaiFENesin (ROBITUSSIN) 100 MG/5ML SOLN Take 5 mLs (100 mg total) by mouth every 6 (six) hours as needed for cough or to loosen phlegm. 11/25/15   Vaughan Basta, MD  hydrALAZINE (APRESOLINE) 25 MG tablet Take 25 mg by mouth daily. 04/25/15   Historical Provider, MD  losartan-hydrochlorothiazide (HYZAAR) 100-25 MG  tablet Take 1 tablet by mouth daily. 04/25/15   Historical Provider, MD  omeprazole (PRILOSEC) 40 MG capsule Take 1 capsule by mouth daily. 05/01/15   Historical Provider, MD  vitamin E (VITAMIN E) 400 UNIT capsule Take 400 Units by mouth daily.    Historical Provider, MD  warfarin (COUMADIN) 6 MG tablet Take 6 mg by mouth daily. (Pt has not taken, must be picked up from Pharmacy)    Historical Provider, MD    Allergies Penicillins  Family History  Problem Relation Age of Onset  . Breast cancer Sister   . Heart disease Mother   . Congestive Heart Failure Sister     Social History Social History    Substance Use Topics  . Smoking status: Never Smoker  . Smokeless tobacco: Never Used  . Alcohol use No    Review of Systems Constitutional: No fever/chills Eyes: No visual changes. ENT: No sore throat. Cardiovascular: Denies chest pain. Respiratory: Denies shortness of breath. Occasional cough with a small amount of blood and sputum noted this morning. Gastrointestinal: No abdominal pain.  No nausea, no vomiting.  No diarrhea.  No constipation. Genitourinary: Negative for dysuria. Musculoskeletal: Negative for back pain. Skin: Negative for rash. Neurological: Negative for headaches, focal weakness or numbness.  10-point ROS otherwise negative.  ____________________________________________   PHYSICAL EXAM:  VITAL SIGNS: ED Triage Vitals  Enc Vitals Group     BP 09/10/16 1510 135/85     Pulse Rate 09/10/16 1510 79     Resp 09/10/16 1510 16     Temp 09/10/16 1510 98.4 F (36.9 C)     Temp Source 09/10/16 1510 Oral     SpO2 09/10/16 1510 100 %     Weight 09/10/16 1508 290 lb (131.5 kg)     Height 09/10/16 1508 5\' 1"  (1.549 m)     Head Circumference --      Peak Flow --      Pain Score 09/10/16 1508 10     Pain Loc --      Pain Edu? --      Excl. in [place]? --     Constitutional: Alert and oriented. Well appearing and in no acute distress. Eyes: Conjunctivae are normal. PERRL. EOMI. Head: Atraumatic. Nose: No congestion/rhinnorhea. Mouth/Throat: Mucous membranes are moist.  Oropharynx non-erythematous. Neck: No stridor.   Cardiovascular: Normal rate, regular rhythm. Grossly normal heart sounds.  Good peripheral circulation. Respiratory: Normal respiratory effort.  No retractions. Lungs CTAB. Gastrointestinal: Soft and nontender. No distention. No abdominal bruits. No CVA tenderness. Musculoskeletal: No lower extremity tenderness nor edema.  No joint effusions.Knees appear to have chronic arthritic changes bilateral, there is no erythema or effusion in either knee  noted. No redness. No cords or edema. Patient able to walk well without any noted acute abnormality. Good perfusion lower extremity bilateral. Neurologic:  Normal speech and language. No gross focal neurologic deficits are appreciated. No gait instability. Skin:  Skin is warm, dry and intact. No rash noted. Psychiatric: Mood and affect are normal. Speech and behavior are normal.  ____________________________________________   LABS (all labs ordered are listed, but only abnormal results are displayed)  Labs Reviewed  CBC WITH DIFFERENTIAL/PLATELET - Abnormal; Notable for the following:       Result Value   RDW 16.5 (*)    All other components within normal limits  BASIC METABOLIC PANEL - Abnormal; Notable for the following:    Anion gap 3 (*)    All other components within normal  limits  PROTIME-INR - Abnormal; Notable for the following:    Prothrombin Time 33.1 (*)    All other components within normal limits   ____________________________________________  EKG   ____________________________________________  RADIOLOGY  Dg Chest 2 View  Result Date: 09/10/2016 CLINICAL DATA:  Hemoptysis. EXAM: CHEST  2 VIEW COMPARISON:  Radiographs of June 30, 2016. FINDINGS: The heart size and mediastinal contours are within normal limits. Both lungs are clear. No pneumothorax or pleural effusion is noted. The visualized skeletal structures are unremarkable. IMPRESSION: No active cardiopulmonary disease. Electronically Signed   By: Marijo Conception, M.D.   On: 09/10/2016 16:14   US Venous Img Lower Bilateral  Result Date: 09/10/2016 CLINICAL DATA:  Chronic lower extremity pain EXAM: BILATERAL LOWER EXTREMITY VENOUS DUPLEX ULTRASOUND TECHNIQUE: Gray-scale sonography with graded compression, as well as color Doppler and duplex ultrasound were performed to evaluate the lower extremity deep venous systems from the level of the common femoral vein and including the common femoral, femoral, profunda  femoral, popliteal and calf veins including the posterior tibial, peroneal and gastrocnemius veins when visible. The superficial great saphenous vein was also interrogated. Spectral Doppler was utilized to evaluate flow at rest and with distal augmentation maneuvers in the common femoral, femoral and popliteal veins. COMPARISON:  Left lower extremity venous duplex ultrasound February 15, 2016 and bilateral lower extremity venous duplex ultrasound November 23, 2015 FINDINGS: RIGHT LOWER EXTREMITY Common Femoral Vein: No evidence of thrombus. Normal compressibility, respiratory phasicity and response to augmentation. Saphenofemoral Junction: No evidence of thrombus. Normal compressibility and flow on color Doppler imaging. Profunda Femoral Vein: No evidence of thrombus. Normal compressibility and flow on color Doppler imaging. Femoral Vein: No evidence of thrombus. Normal compressibility, respiratory phasicity and response to augmentation. Popliteal Vein: No evidence of thrombus. Normal compressibility, respiratory phasicity and response to augmentation. Calf Veins: No evidence of thrombus. Normal compressibility and flow on color Doppler imaging. Superficial Great Saphenous Vein: No evidence of thrombus. Normal compressibility and flow on color Doppler imaging. Venous Reflux:  None. Other Findings:  None. LEFT LOWER EXTREMITY Common Femoral Vein: No evidence of thrombus. Normal compressibility, respiratory phasicity and response to augmentation. Saphenofemoral Junction: No evidence of thrombus. Normal compressibility and flow on color Doppler imaging. Profunda Femoral Vein: No evidence of thrombus. Normal compressibility and flow on color Doppler imaging. Femoral Vein: No evidence of thrombus. Normal compressibility, respiratory phasicity and response to augmentation. Popliteal Vein: No evidence of thrombus. Normal compressibility, respiratory phasicity and response to augmentation. Calf Veins: No evidence of thrombus.  Normal compressibility and flow on color Doppler imaging. Superficial Great Saphenous Vein: No evidence of thrombus. Normal compressibility and flow on color Doppler imaging. Venous Reflux:  None. Other Findings:  None. IMPRESSION: No evidence of deep venous thrombosis in either lower extremity. Electronically Signed   By: Lowella Grip III M.D.   On: 09/10/2016 19:43    ____________________________________________   PROCEDURES  Procedure(s) performed: None  Procedures  Critical Care performed: No  ____________________________________________   INITIAL IMPRESSION / ASSESSMENT AND PLAN / ED COURSE  Pertinent labs & imaging results that were available during my care of the patient were reviewed by me and considered in my medical decision making (see chart for details).  Hemoptysis: Patient on anticoagulant, small amount of hemoptysis this morning. No longer having hemoptysis. Chest x-ray clear. INR is therapeutic, and given this I think the chance of a pulmonary embolism is extremely low as the patient reports compliance, no chest pain, no shortness of  breath, with a normal oxygen saturation. She may have some slight bronchitis, she is not a smoker, or she may have some mild bronchial irritation with slight bleeding. At this point I do not believe there is any obvious indication for any additional treatment other than careful return precautions and follow-up care regarding the small amount of hemoptysis reported.  Lower knee pain appears to be chronic, no evidence of acute. No signs of infection. Neurovascular intact. Able to walk well. Discussed with the patient, advised close follow-up with her primary which she is agreeable.  Clinical Course      ____________________________________________   FINAL CLINICAL IMPRESSION(S) / ED DIAGNOSES  Final diagnoses:  Cough with hemoptysis   ----------------------------------------- 8:35 PM on  09/10/2016 -----------------------------------------  INR is therapeutic. Patient resting comfortably. No complaint this time. We'll discharge the patient home, advise close primary follow-up and careful return precautions. Return precautions and treatment recommendations and follow-up discussed with the patient who is agreeable with the plan.    NEW MEDICATIONS STARTED DURING THIS VISIT:  New Prescriptions   No medications on file     Note:  This document was prepared using Dragon voice recognition software and may include unintentional dictation errors.     Delman Kitten, MD 09/10/16 2035

## 2016-09-10 NOTE — Discharge Instructions (Signed)
If you develop any new or worsening symptoms, including but not limited to fever, blood in your stool, increased or recurrence of blood in your sputum, CHEST PAIN or SHORTNESS OF BREATH, persistent vomiting, worsening shortness of breath, or other symptoms that concern you, please return to the Emergency Department immediately.

## 2016-09-10 NOTE — ED Triage Notes (Signed)
Patient ambulatory without difficulty to triage.Patient states that this morning she "spit up blood". Patient states that she had a similar episode last week and her PCP told her it was probably from coughing. Patient states that she was not coughing today when she spit the blood up. Patient states that the blood was dark red and was less than a teaspoon amount. Patient states that after that she saw a small amount of blood in her sputum.  Patient also c/o right knee pain.

## 2016-09-10 NOTE — ED Notes (Signed)
Pt reports coughing up teaspoon of blood.  Pt has a cough.  No chest pain or sob.  Nonsmoker.  Pt also has right knee pain.  No known injury  Pt alert.

## 2016-12-01 ENCOUNTER — Emergency Department
Admission: EM | Admit: 2016-12-01 | Discharge: 2016-12-01 | Disposition: A | Discharge status: Home / Self Care | Payer: Medicare Other | Attending: Emergency Medicine | Admitting: Emergency Medicine

## 2016-12-01 ENCOUNTER — Emergency Department: Payer: Medicare Other

## 2016-12-01 ENCOUNTER — Encounter: Payer: Self-pay | Admitting: Emergency Medicine

## 2016-12-01 DIAGNOSIS — J069 Acute upper respiratory infection, unspecified: Secondary | ICD-10-CM | POA: Diagnosis not present

## 2016-12-01 DIAGNOSIS — Z7901 Long term (current) use of anticoagulants: Secondary | ICD-10-CM | POA: Insufficient documentation

## 2016-12-01 DIAGNOSIS — J45909 Unspecified asthma, uncomplicated: Secondary | ICD-10-CM | POA: Insufficient documentation

## 2016-12-01 DIAGNOSIS — Z79899 Other long term (current) drug therapy: Secondary | ICD-10-CM | POA: Diagnosis not present

## 2016-12-01 DIAGNOSIS — B9789 Other viral agents as the cause of diseases classified elsewhere: Secondary | ICD-10-CM

## 2016-12-01 DIAGNOSIS — I10 Essential (primary) hypertension: Secondary | ICD-10-CM | POA: Insufficient documentation

## 2016-12-01 DIAGNOSIS — R0602 Shortness of breath: Secondary | ICD-10-CM | POA: Diagnosis present

## 2016-12-01 LAB — CBC WITH DIFFERENTIAL/PLATELET
BASOS ABS: 0 10*3/uL (ref 0–0.1)
Basophils Relative: 1 %
Eosinophils Absolute: 0 10*3/uL (ref 0–0.7)
Eosinophils Relative: 0 %
HEMATOCRIT: 36.3 % (ref 35.0–47.0)
HEMOGLOBIN: 12.8 g/dL (ref 12.0–16.0)
LYMPHS ABS: 2.6 10*3/uL (ref 1.0–3.6)
LYMPHS PCT: 29 %
MCH: 28.9 pg (ref 26.0–34.0)
MCHC: 35.2 g/dL (ref 32.0–36.0)
MCV: 82.3 fL (ref 80.0–100.0)
Monocytes Absolute: 0.4 10*3/uL (ref 0.2–0.9)
Monocytes Relative: 4 %
NEUTROS ABS: 5.7 10*3/uL (ref 1.4–6.5)
Neutrophils Relative %: 66 %
Platelets: 241 10*3/uL (ref 150–440)
RBC: 4.41 MIL/uL (ref 3.80–5.20)
RDW: 16.9 % — ABNORMAL HIGH (ref 11.5–14.5)
WBC: 8.7 10*3/uL (ref 3.6–11.0)

## 2016-12-01 LAB — BASIC METABOLIC PANEL
ANION GAP: 8 (ref 5–15)
BUN: 21 mg/dL — AB (ref 6–20)
CO2: 30 mmol/L (ref 22–32)
Calcium: 9.5 mg/dL (ref 8.9–10.3)
Chloride: 103 mmol/L (ref 101–111)
Creatinine, Ser: 0.96 mg/dL (ref 0.44–1.00)
GFR calc Af Amer: >60 mL/min (ref >60)
GFR calc non Af Amer: >60 mL/min (ref >60)
GLUCOSE: 146 mg/dL — AB (ref 65–99)
Potassium: 3.8 mmol/L (ref 3.5–5.1)
Sodium: 141 mmol/L (ref 135–145)

## 2016-12-01 LAB — PROTIME-INR
INR: 2.64
PROTHROMBIN TIME: 28.7 s — AB (ref 11.4–15.2)

## 2016-12-01 LAB — INFLUENZA PANEL BY PCR (TYPE A & B)
INFLAPCR: NEGATIVE
INFLBPCR: NEGATIVE

## 2016-12-01 LAB — BRAIN NATRIURETIC PEPTIDE: B NATRIURETIC PEPTIDE 5: 34 pg/mL (ref 0.0–100.0)

## 2016-12-01 LAB — TROPONIN I

## 2016-12-01 MED ORDER — IPRATROPIUM-ALBUTEROL 0.5-2.5 (3) MG/3ML IN SOLN
3.0000 mL | Freq: Once | RESPIRATORY_TRACT | Status: AC
Start: 2016-12-01 — End: 2016-12-01
  Administered 2016-12-01: 3 mL via RESPIRATORY_TRACT
  Filled 2016-12-01: qty 3

## 2016-12-01 MED ORDER — HYDROCOD POLST-CPM POLST ER 10-8 MG/5ML PO SUER: 5.0000 mL | Freq: Two times a day (BID) | ORAL | 0 refills | Status: DC

## 2016-12-01 NOTE — ED Provider Notes (Signed)
[facility] Emergency Department Provider Note       Time seen: ----------------------------------------- 3:43 PM on 12/01/2016 -----------------------------------------     I have reviewed the triage vital signs and the nursing notes.   HISTORY   Chief Complaint Asthma and Cough    HPI Lindsay Cooke is a 56 y.o. female who presents to the ED for who presents to the ER for cough and shortness of breath over the last week. She does have a history of asthma, she was seen by her primary care doctor and given antibiotic, steroids cough suppressants. Patient states she's not gotten any better. She presents to the ER in no distress and in no pain.   Past Medical History:  Diagnosis Date  . Anemia   . Asthma   . Benign essential hypertension   . Lumbar spondylosis   . Obesity   . Reflux   . Right leg DVT [facility])     Patient Active Problem List   Diagnosis Date Noted  . CVA (cerebral vascular accident) ([place]) 11/23/2015  . Hemoptysis 11/23/2015  . 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 FATIGUE 10/03/2008  . SNORING 10/03/2008  . GUAIAC POSITIVE STOOL 05/07/2008  . GASTROENTERITIS 02/23/2008  . DEPRESSION 11/27/2007  . MEDIAL MENISCUS TEAR, RIGHT 11/20/2007  . ANEMIA 11/15/2007  . OSTEOARTHRITIS, KNEE 11/15/2007  . GERD 09/06/2007  . OBESITY 07/21/2007  . HYPERTENSION 07/21/2007  . ASTHMA 07/21/2007  . KELOID 07/21/2007    Past Surgical History:  Procedure Laterality Date  . CHOLECYSTECTOMY    . HERNIA REPAIR    . KELOID EXCISION      Allergies Penicillins  Social History Social History  Substance Use Topics  . Smoking status: Never Smoker  . Smokeless tobacco: Never Used  . Alcohol use No    Review of Systems Constitutional: Negative for fever. ENT: Positive for congestion  Cardiovascular: Negative for chest pain. Respiratory: NPositive for shortness of breath, cough Gastrointestinal: Negative for abdominal  pain, vomiting and diarrhea. Genitourinary: Negative for dysuria. Musculoskeletal: Negative for back pain. Skin: Negative for rash. Neurological: Negative for headaches, focal weakness or numbness.  10-point ROS otherwise negative.  ____________________________________________   PHYSICAL EXAM:  VITAL SIGNS: ED Triage Vitals  Enc Vitals Group     BP 12/01/16 1325 132/82     Pulse Rate 12/01/16 1325 95     Resp 12/01/16 1325 18     Temp 12/01/16 1325 98.3 F (36.8 C)     Temp Source 12/01/16 1325 Oral     SpO2 12/01/16 1325 97 %     Weight 12/01/16 1326 291 lb (132 kg)     Height 12/01/16 1326 5\' 1"  (1.549 m)     Head Circumference --      Peak Flow --      Pain Score 12/01/16 1325 0     Pain Loc --      Pain Edu? --      Excl. in [place]? --     Constitutional: Alert and oriented. Well appearing and in no distress.Morbidly obese  Eyes: Conjunctivae are normal. PERRL. Normal extraocular movements. ENT   Head: Normocephalic and atraumatic.   Nose: No congestion/rhinnorhea.   Mouth/Throat: Mucous membranes are moist.   Neck: No stridor. Cardiovascular: Normal rate, regular rhythm. No murmurs, rubs, or gallops. Respiratory: Normal respiratory effort without tachypnea nor retractions. Breath sounds are clear and equal bilaterally. No wheezes/rales/rhonchi. lungs are clear  Gastrointestinal: Soft and nontender. Normal bowel sounds Musculoskeletal:  Nontender with normal range of motion in extremities. No lower extremity tenderness nor edema. Neurologic:  Normal speech and language. No gross focal neurologic deficits are appreciated.  Skin:  Skin is warm, dry and intact. No rash noted. Psychiatric: Mood and affect are normal. Speech and behavior are normal.  ____________________________________________  EKG: Interpreted by me.Sinus rhythm rate of 74 bpm, normal PR interval, normal QT, LVH.  ____________________________________________  ED COURSE:  Pertinent labs &  imaging results that were available during my care of the patient were reviewed by me and considered in my medical decision making (see chart for details). Patient presents for shortness of breath, we will assess with labs and imaging as indicated.   Procedures ____________________________________________   LABS (pertinent positives/negatives)  Labs Reviewed  CBC WITH DIFFERENTIAL/PLATELET - Abnormal; Notable for the following:       Result Value   RDW 16.9 (*)    All other components within normal limits  BASIC METABOLIC PANEL - Abnormal; Notable for the following:    Glucose, Bld 146 (*)    BUN 21 (*)    All other components within normal limits  PROTIME-INR - Abnormal; Notable for the following:    Prothrombin Time 28.7 (*)    All other components within normal limits  BRAIN NATRIURETIC PEPTIDE  TROPONIN I  INFLUENZA PANEL BY PCR (TYPE A & B)    RADIOLOGY  Chest x-ray is normal  ____________________________________________  FINAL ASSESSMENT AND PLAN   URI  Plan: Patient's labs and imaging were dictated above. Patient had presented for URI symptoms, she's had a negative workup here for pneumonia, cardiac or pulmonary etiology. Likely viral in etiology. We will discharge with symptomatic treatment.   Earleen Newport, MD   Note: This note was generated in part or whole with voice recognition software. Voice recognition is usually quite accurate but there are transcription errors that can and very often do occur. I apologize for any typographical errors that were not detected and corrected.     Earleen Newport, MD 12/01/16 [phone]

## 2016-12-01 NOTE — ED Triage Notes (Signed)
Pt to ED via POV with c/o cough and increased SOB since last week. Pt hx of asthma. Pt was seen at PCP and given antibiotics, steroids, and cough suppressants , pt states no relief. Pt in NAD at this time, RR 22 . Denies fevers at home. Pt A&OX4

## 2016-12-01 NOTE — ED Notes (Signed)
Pt discharged home after verbalizing understanding of discharge instructions; nad noted. 

## 2016-12-03 ENCOUNTER — Telehealth: Payer: Self-pay | Admitting: Emergency Medicine

## 2016-12-03 NOTE — Telephone Encounter (Signed)
Patient called and says she could not get the cold med as it was $50.  I told her she could ask a pharmacist to assist her in getting an appropriate otc cough med or she could use goodrx and it was $30 at [facility].

## 2016-12-23 ENCOUNTER — Other Ambulatory Visit: Payer: Self-pay | Admitting: Nurse Practitioner

## 2016-12-23 DIAGNOSIS — M546 Pain in thoracic spine: Secondary | ICD-10-CM

## 2017-01-01 ENCOUNTER — Ambulatory Visit
Admission: RE | Admit: 2017-01-01 | Discharge: 2017-01-01 | Disposition: A | Discharge status: Home / Self Care | Payer: Medicare Other | Source: Ambulatory Visit | Attending: Nurse Practitioner | Admitting: Nurse Practitioner

## 2017-01-01 DIAGNOSIS — M546 Pain in thoracic spine: Secondary | ICD-10-CM | POA: Diagnosis present

## 2017-02-13 IMAGING — CT CT HEAD W/O CM
3 series · 18 of 30 positions shown, 20 images · non-contrast
Comparison: 05/29/2014 head CT.

CLINICAL DATA: Slurred speech.

EXAM:
CT HEAD WITHOUT CONTRAST
TECHNIQUE: Contiguous axial images were obtained from the base of the skull
through the vertex without intravenous contrast.

[Series 2: soft tissue · axial · 0.41mm/px · z∈[-181,-76]mm · 8 of 29 slices shown]
[im 4/29  brain]
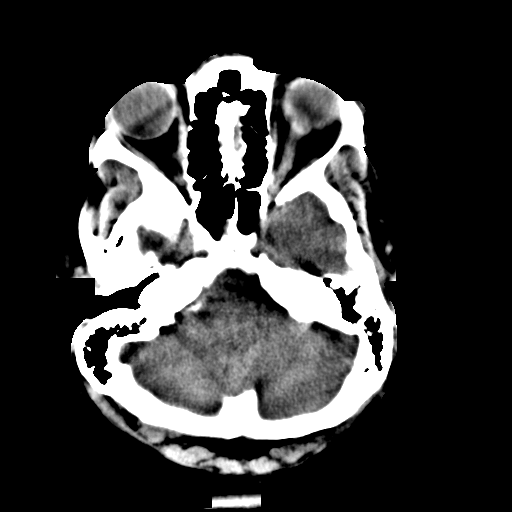
[im 7/29  brain]
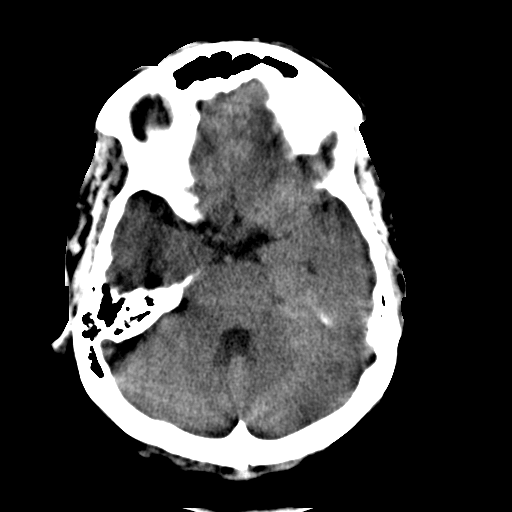
[im 10/29  brain]
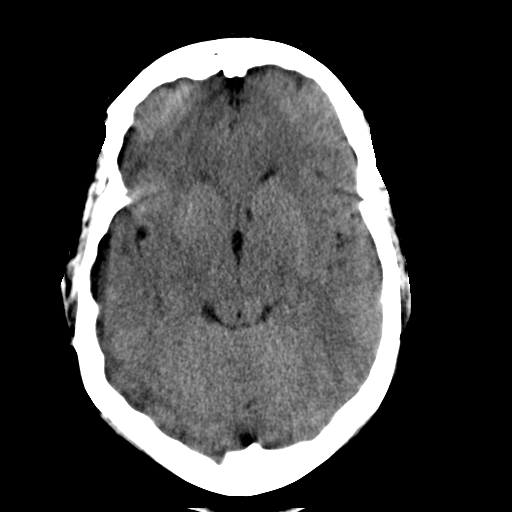
[im 13/29  brain]
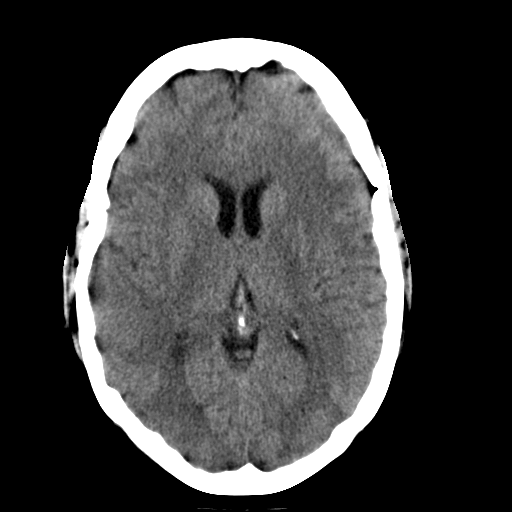
[im 16/29  brain]
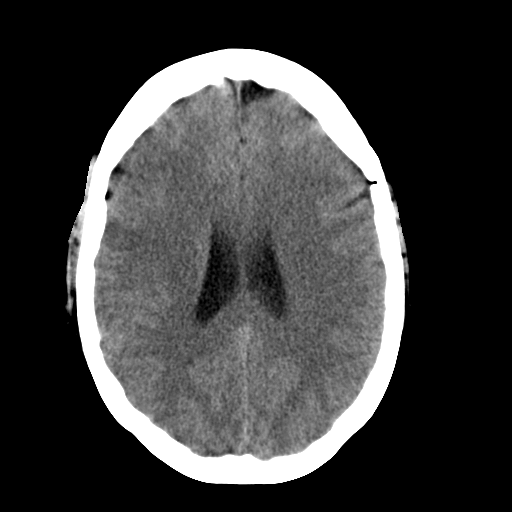
[im 19/29  brain]
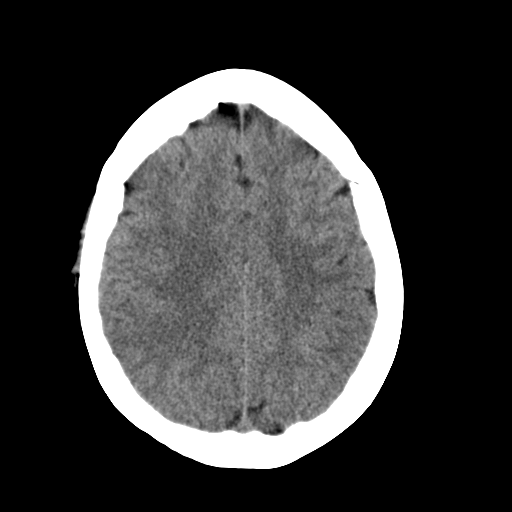
[im 22/29  brain]
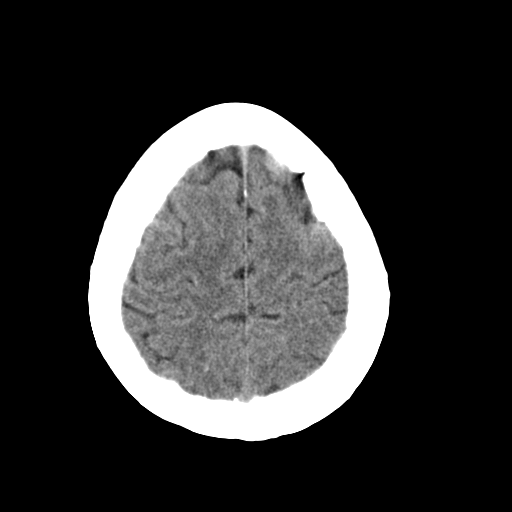
[im 25/29  brain]
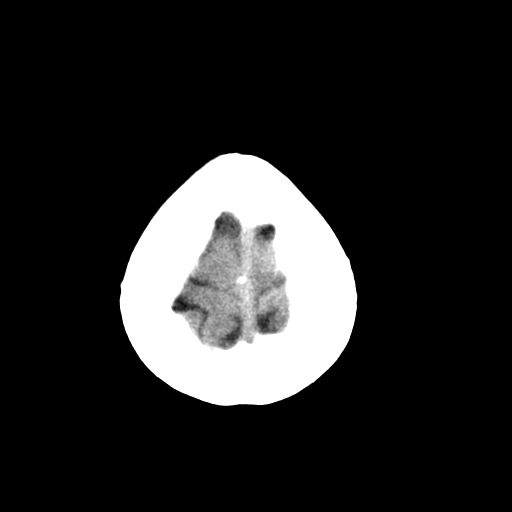

[Series 4: soft tissue recon · axial · 0.42mm/px · z∈[-174,-69]mm · 8 of 29 slices shown, 10 images]
[im 4/29  brain]
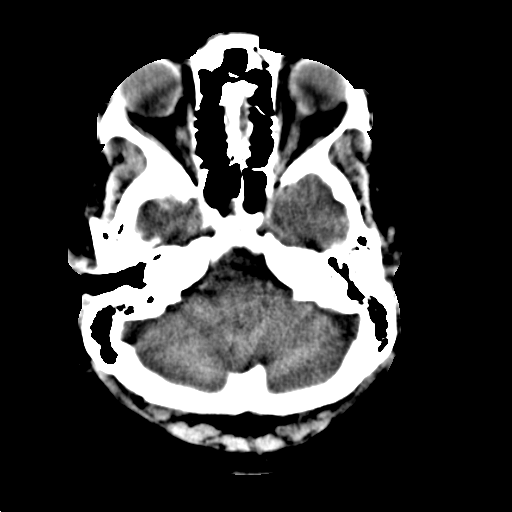
[im 4/29  bone]
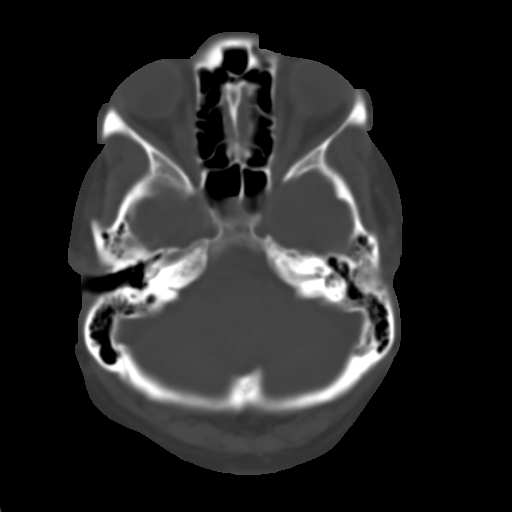
[im 7/29  brain]
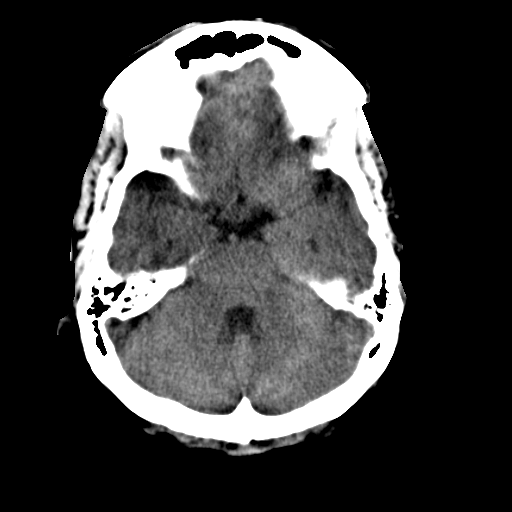
[im 10/29  brain]
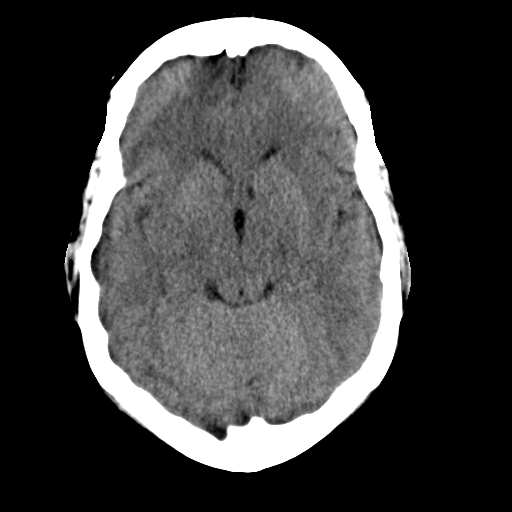
[im 13/29  brain]
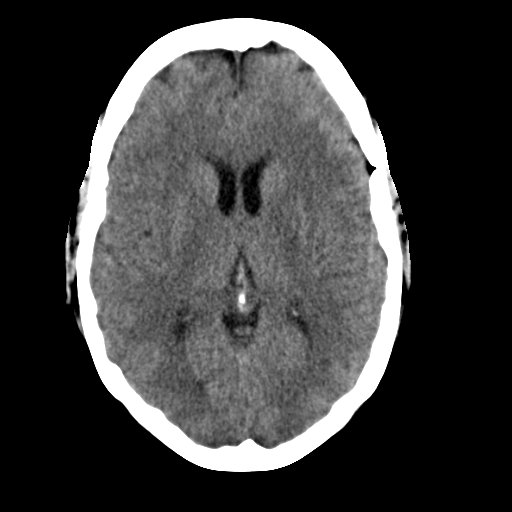
[im 16/29  brain]
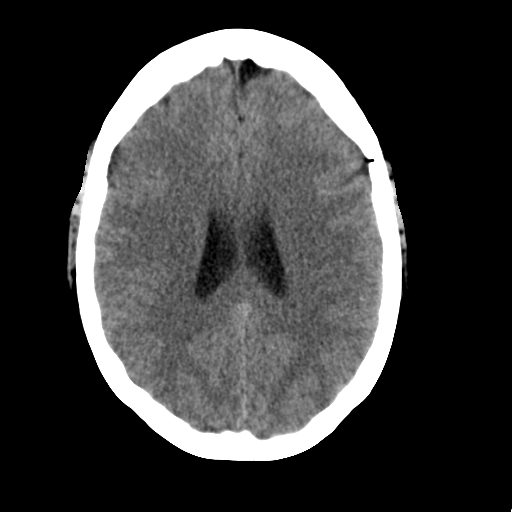
[im 16/29  bone]
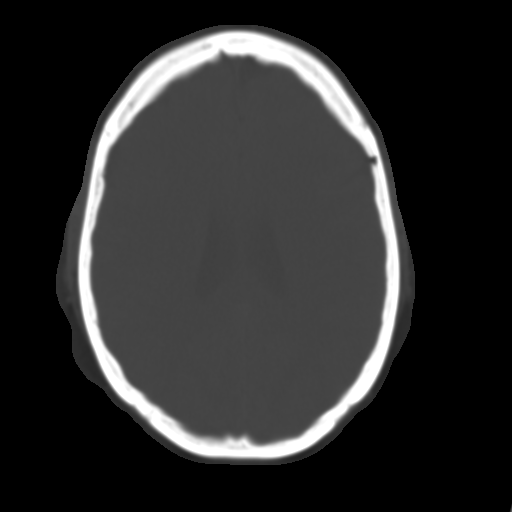
[im 19/29  brain]
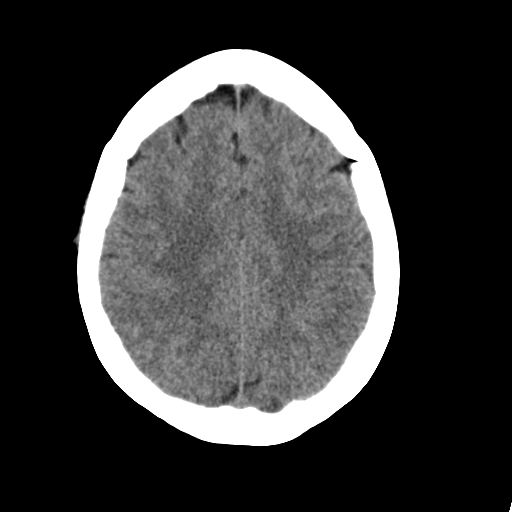
[im 22/29  brain]
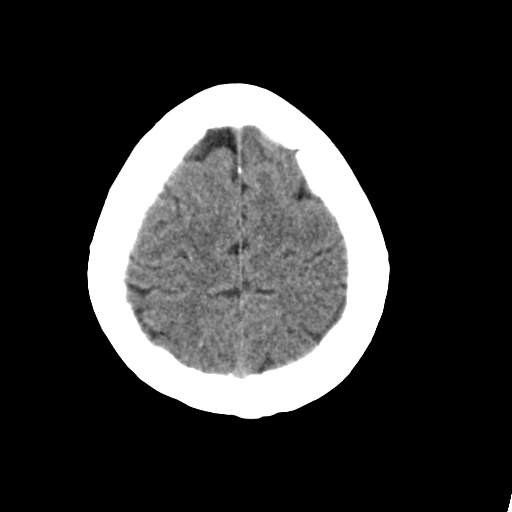
[im 25/29  brain]
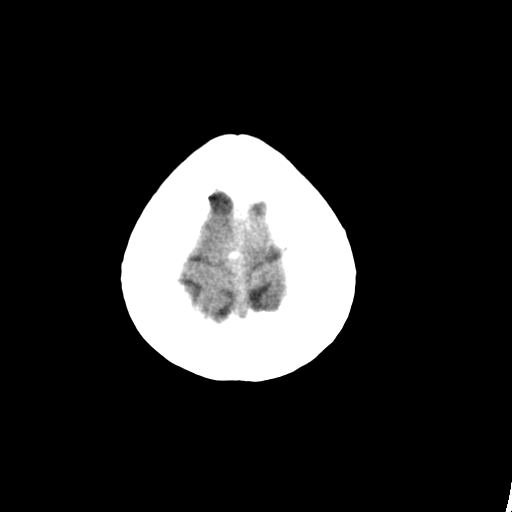

[Series 5: bone recon · axial · 0.42mm/px · z∈[-174,-159]mm · 2 of 29 slices shown]
[im 4/29  bone]
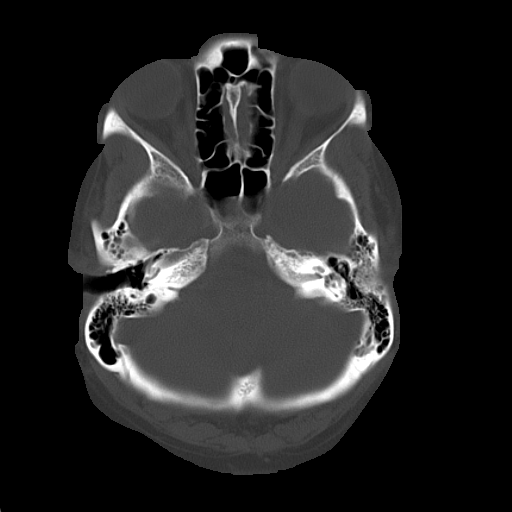
[im 7/29  bone]
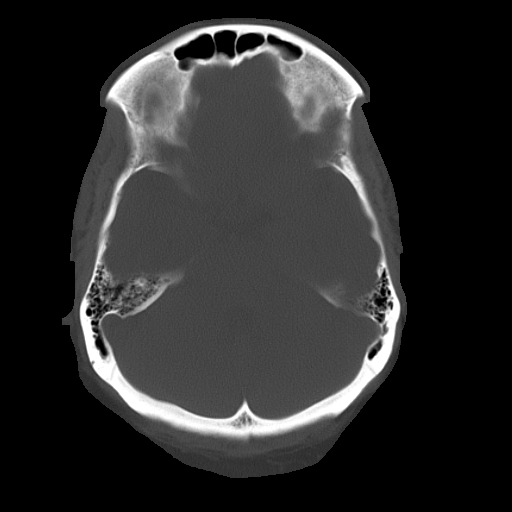

[18 of 30 positions shown; findings below may reference images not displayed]

FINDINGS: The anterior temporal fossa is poorly visualized due to extensive
streak artifact from the skull base. No evidence of parenchymal
hemorrhage or extra-axial fluid collection. No mass lesion, mass
effect, or midline shift.

No CT evidence of acute infarct.

Cerebral volume is age appropriate. No ventriculomegaly.

The visualized paranasal sinuses are essentially clear. The mastoid
air cells are unopacified. No evidence of calvarial fracture.
IMPRESSION: Negative head CT.  No acute intracranial abnormality.

## 2017-03-07 ENCOUNTER — Inpatient Hospital Stay: Payer: Medicare Other | Attending: Internal Medicine | Admitting: Internal Medicine

## 2017-03-07 DIAGNOSIS — Z79899 Other long term (current) drug therapy: Secondary | ICD-10-CM

## 2017-03-07 DIAGNOSIS — E669 Obesity, unspecified: Secondary | ICD-10-CM | POA: Diagnosis not present

## 2017-03-07 DIAGNOSIS — I10 Essential (primary) hypertension: Secondary | ICD-10-CM | POA: Insufficient documentation

## 2017-03-07 DIAGNOSIS — Z803 Family history of malignant neoplasm of breast: Secondary | ICD-10-CM | POA: Insufficient documentation

## 2017-03-07 DIAGNOSIS — J45909 Unspecified asthma, uncomplicated: Secondary | ICD-10-CM

## 2017-03-07 DIAGNOSIS — Z86718 Personal history of other venous thrombosis and embolism: Secondary | ICD-10-CM | POA: Diagnosis not present

## 2017-03-07 DIAGNOSIS — Z7901 Long term (current) use of anticoagulants: Secondary | ICD-10-CM | POA: Insufficient documentation

## 2017-03-07 DIAGNOSIS — K219 Gastro-esophageal reflux disease without esophagitis: Secondary | ICD-10-CM | POA: Insufficient documentation

## 2017-03-07 DIAGNOSIS — I82401 Acute embolism and thrombosis of unspecified deep veins of right lower extremity: Secondary | ICD-10-CM

## 2017-03-07 DIAGNOSIS — M479 Spondylosis, unspecified: Secondary | ICD-10-CM | POA: Diagnosis not present

## 2017-03-07 DIAGNOSIS — Z8673 Personal history of transient ischemic attack (TIA), and cerebral infarction without residual deficits: Secondary | ICD-10-CM | POA: Diagnosis not present

## 2017-03-07 NOTE — Progress Notes (Signed)
[place] NOTE  Patient Care Team: Danelle Berry, NP as PCP - General (Nurse Practitioner) Dionisio David, MD as Consulting Physician (Cardiology)  CHIEF COMPLAINTS/PURPOSE OF CONSULTATION:  DVT recurrent  HEMATOLOGIC HISTORY:  # ? 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 morbidly obese female with history of right lower extremity DVT 2 most recently September 2016 that showed a right lower extremity DVT was started on Eliquis in sep 2016; but later switched to Coumadin by neurology in March 2017 because of acute stroke.  As the patient, mild blood in stools; sporadic and resolved. Patient was taken off Coumadin; currently on Lovenox 80 mg once a day. Patient actually likes injections better- as avoids the uncertainty/frequent blood work needed. Coumadin. No new blood clots.  Patient motivated; wants to lose weight. She has been exercising/walking. She has been going to the bariatric classes  ROS: No chest pain or shortness of breath or cough.   MEDICAL HISTORY:  Past Medical History:  Diagnosis Date  . Anemia   . Asthma   . Benign essential hypertension   . Lumbar spondylosis   . Obesity   . Reflux   . Right leg DVT ([place])     SURGICAL HISTORY: Past Surgical History:  Procedure Laterality Date  . CHOLECYSTECTOMY    . HERNIA REPAIR    . KELOID EXCISION      SOCIAL HISTORY: Social History   Social History  . Marital status: Single    Spouse name: N/A  . Number of children: N/A  . Years of education: N/A   Occupational History  . Not on file.   Social History Main Topics  . Smoking status: Never Smoker  .  Smokeless tobacco: Never Used  . Alcohol use No  . Drug use: No  . Sexual activity: No   Other Topics Concern  . Not on file   Social History Narrative  . No narrative on file    FAMILY HISTORY: Family History  Problem Relation Age of Onset  . Breast cancer Sister   . Heart disease Mother   . Congestive Heart Failure Sister     ALLERGIES:  is allergic to penicillins.  MEDICATIONS:  Current Outpatient Prescriptions  Medication Sig Dispense Refill  . atorvastatin (LIPITOR) 40 MG tablet Take 1 tablet (40 mg total) by mouth daily at 6 PM. 30 tablet 0  . cetirizine (ZYRTEC) 10 MG tablet Take 10 mg by mouth daily.    . Cholecalciferol (D3-1000 PO) Take 1,000 Units by mouth daily.     . Cinnamon 500 MG capsule Take 500 mg by mouth daily.     . cyclobenzaprine (FLEXERIL) 10 MG tablet Take 1 tablet by mouth every 8 (eight) hours as needed for muscle spasms.    Marland Kitchen enoxaparin (LOVENOX) 80 MG/0.8ML injection Inject 80 mg into the skin daily.    . ferrous sulfate 325 (65 FE) MG EC tablet Take 325 mg by mouth daily.    . hydrALAZINE (APRESOLINE) 25 MG tablet Take 25 mg by mouth daily.    Marland Kitchen levocetirizine (XYZAL) 5 MG tablet Take 1 tablet by mouth daily.    Marland Kitchen losartan-hydrochlorothiazide (HYZAAR) 100-25 MG tablet Take 1  tablet by mouth daily.    . Omega-3 Fatty Acids (FISH OIL) 1000 MG CAPS Take 1 capsule by mouth daily.    Marland Kitchen omeprazole (PRILOSEC) 40 MG capsule Take 1 capsule by mouth daily.    . vitamin E (VITAMIN E) 400 UNIT capsule Take 400 Units by mouth daily.    Marland Kitchen albuterol (PROVENTIL HFA;VENTOLIN HFA) 108 (90 Base) MCG/ACT inhaler Inhale 2 puffs into the lungs every 6 (six) hours as needed for wheezing or shortness of breath. (Patient not taking: Reported on 03/07/2017) 1 Inhaler 0   No current facility-administered medications for this visit.       Marland Kitchen  PHYSICAL EXAMINATION: ECOG PERFORMANCE STATUS: 1 - Symptomatic but completely ambulatory  Vitals:   03/07/17 1015  BP: 131/86   Pulse: 68  Resp: 20  Temp: 97.8 F (36.6 C)   Filed Weights   03/07/17 1018  Weight: 288 lb (130.6 kg)    GENERAL: Well-nourished well-developed; Alert, no distress; mild discomfort because of knee pain. Morbidly obese; has difficulty getting onto the table because of habitus. EYES: no pallor or icterus OROPHARYNX: no thrush or ulceration; good dentition  NECK: supple, no masses felt LYMPH: no palpable lymphadenopathy in the cervical, axillary or inguinal regions LUNGS: clear to auscultation and No wheeze or crackles HEART/CVS: regular rate & rhythm and no murmurs; Right thigh- seems to be swollen compared to the left. No tenderness noted.  ABDOMEN: abdomen soft, non-tender and normal bowel sounds Musculoskeletal:no cyanosis of digits and no clubbing  PSYCH: alert & oriented x 3 with fluent speech NEURO: no focal motor/sensory deficits SKIN: no rashes; keloid noted on the right side of the face.  LABORATORY DATA:  I have reviewed the data as listed Lab Results  Component Value Date   WBC 8.7 12/01/2016   HGB 12.8 12/01/2016   HCT 36.3 12/01/2016   MCV 82.3 12/01/2016   PLT 241 12/01/2016    Recent Labs  06/30/16 1412 09/10/16 1526 12/01/16 1600  NA 140 139 141  K 3.7 3.6 3.8  CL 103 104 103  CO2 29 32 30  GLUCOSE 95 85 146*  BUN 13 14 21*  CREATININE 0.94 0.87 0.96  CALCIUM 9.4 9.5 9.5  GFRNONAA >60 >60 >60  GFRAA >60 >60 >60    RADIOGRAPHIC STUDIES: I have personally reviewed the radiological images as listed and agreed with the findings in the report. No results found.  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 More recently, switch to Lovenox- by PCP given the blood per rectum.   #  Continue Lovenox as per PCP; awaiting GI evaluation in  end of July 2018.   # In general I would have recommended anticoagulation for DVT for 6 months; however patient is recommended indefinite anticoagulation with Coumadin from her stroke perspective. I would defer to PCP/neurology re: anti-coagulation perspective.   # Encouraged the patient to lose weight; she seems to motivated.  # I left a message for patient's primary care physician Jerolyn Center- to discuss the above plan.  # Follow-up in 4 months/labs    Cammie Sickle, MD 03/07/2017 6:04 PM

## 2017-03-07 NOTE — Progress Notes (Signed)
Patient referred back by pcp for h/o of DVT/PE.  Pt 2 out of 3 positive heme cards one month ago. Pt was taken off of Coumadin and placed on Lovenox injections 80 mg daily at 6 pm. Patient will see [facility] provider -(Gustavo Lah) on July 24'th to evaluate/work up +heme cards. Pt has a h/o hemorrhoids.  Pt denies any hematuria or vaginal bleeding. Patient did not notice any rectal bleeding prior to heme cards. Pt stated that she was constipated and had to strain.

## 2017-03-07 NOTE — Assessment & Plan Note (Addendum)
#   Right lower extremity DVT 2; and most recently September 2016-started on Eliquis; however in March 2017 switch over to Coumadin given the stroke by neurology. More recently, switch to Lovenox- by PCP given the blood per rectum.   #  Continue Lovenox as per PCP; awaiting GI evaluation in end of July 2018.   # In general I would have recommended anticoagulation for DVT for 6 months; however patient is recommended indefinite anticoagulation with Coumadin from her stroke perspective. I would defer to PCP/neurology re: anti-coagulation perspective.   # Encouraged the patient to lose weight; she seems to motivated.  # I left a message for patient's primary care physician Jerolyn Center- to discuss the above plan.  # Follow-up in 4 months/labs

## 2017-03-08 ENCOUNTER — Ambulatory Visit: Payer: Medicare Other | Admitting: Internal Medicine

## 2017-04-14 ENCOUNTER — Other Ambulatory Visit: Payer: Self-pay | Admitting: Nurse Practitioner

## 2017-04-14 DIAGNOSIS — L728 Other follicular cysts of the skin and subcutaneous tissue: Secondary | ICD-10-CM

## 2017-04-14 DIAGNOSIS — R2242 Localized swelling, mass and lump, left lower limb: Secondary | ICD-10-CM

## 2017-04-18 ENCOUNTER — Encounter: Payer: Self-pay | Admitting: Emergency Medicine

## 2017-04-18 ENCOUNTER — Ambulatory Visit
Admission: RE | Admit: 2017-04-18 | Discharge: 2017-04-18 | Disposition: A | Discharge status: Home / Self Care | Payer: Medicare Other | Source: Ambulatory Visit | Attending: Nurse Practitioner | Admitting: Nurse Practitioner

## 2017-04-18 ENCOUNTER — Emergency Department
Admission: EM | Admit: 2017-04-18 | Discharge: 2017-04-18 | Disposition: A | Discharge status: Home / Self Care | Payer: Medicare Other | Attending: Emergency Medicine | Admitting: Emergency Medicine

## 2017-04-18 DIAGNOSIS — R2242 Localized swelling, mass and lump, left lower limb: Secondary | ICD-10-CM | POA: Insufficient documentation

## 2017-04-18 DIAGNOSIS — R21 Rash and other nonspecific skin eruption: Secondary | ICD-10-CM | POA: Diagnosis present

## 2017-04-18 DIAGNOSIS — Z79899 Other long term (current) drug therapy: Secondary | ICD-10-CM | POA: Insufficient documentation

## 2017-04-18 DIAGNOSIS — J45909 Unspecified asthma, uncomplicated: Secondary | ICD-10-CM | POA: Diagnosis not present

## 2017-04-18 DIAGNOSIS — L728 Other follicular cysts of the skin and subcutaneous tissue: Secondary | ICD-10-CM | POA: Insufficient documentation

## 2017-04-18 DIAGNOSIS — I10 Essential (primary) hypertension: Secondary | ICD-10-CM | POA: Diagnosis not present

## 2017-04-18 DIAGNOSIS — L2082 Flexural eczema: Secondary | ICD-10-CM | POA: Diagnosis not present

## 2017-04-18 DIAGNOSIS — Z86718 Personal history of other venous thrombosis and embolism: Secondary | ICD-10-CM | POA: Insufficient documentation

## 2017-04-18 DIAGNOSIS — Z8673 Personal history of transient ischemic attack (TIA), and cerebral infarction without residual deficits: Secondary | ICD-10-CM | POA: Insufficient documentation

## 2017-04-18 MED ORDER — TRIAMCINOLONE ACETONIDE 0.025 % EX OINT: 1.0000 "application " | TOPICAL_OINTMENT | Freq: Two times a day (BID) | CUTANEOUS | 0 refills | Status: DC

## 2017-04-18 NOTE — ED Triage Notes (Signed)
Pt reports rash to neck and folds of groin. Pt reports rash itches. Pt reports has seen PCP multiple times and tried several creams without relief. Denies having seen dermatologist.

## 2017-04-18 NOTE — ED Notes (Signed)
Pt discharged to home.  Discharge instructions reviewed.  Verbalized understanding.  No questions or concerns at this time.  Teach back verified.  Pt in NAD.  No items left in ED.   

## 2017-04-18 NOTE — ED Provider Notes (Signed)
[facility] Emergency Department Provider Note  ____________________________________________  Time seen: Approximately 6:06 PM  I have reviewed the triage vital signs and the nursing notes.   HISTORY  Chief Complaint Rash    HPI Lindsay Cooke is a 56 y.o. female presenting to the emergency department with eczema of the flexural surfaces of the bilateral arms, neck and inner thighs. Patient states that eczema has been pruritic. Patient states that she is in the process of being referred to dermatology by primary care. She states that she has tried several "cremes". She denies dysphagia, facial swelling, chest pain, chest tightness, nausea, vomiting and abdominal pain.   Past Medical History:  Diagnosis Date  . Anemia   . Asthma   . Benign essential hypertension   . Lumbar spondylosis   . Obesity   . Reflux   . Right leg DVT [facility])     Patient Active Problem List   Diagnosis Date Noted  . CVA (cerebral vascular accident) ([place]) 11/23/2015  . Hemoptysis 11/23/2015  . 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 FATIGUE 10/03/2008  . SNORING 10/03/2008  . GUAIAC POSITIVE STOOL 05/07/2008  . GASTROENTERITIS 02/23/2008  . DEPRESSION 11/27/2007  . MEDIAL MENISCUS TEAR, RIGHT 11/20/2007  . ANEMIA 11/15/2007  . OSTEOARTHRITIS, KNEE 11/15/2007  . GERD 09/06/2007  . OBESITY 07/21/2007  . HYPERTENSION 07/21/2007  . ASTHMA 07/21/2007  . KELOID 07/21/2007    Past Surgical History:  Procedure Laterality Date  . CHOLECYSTECTOMY    . HERNIA REPAIR    . KELOID EXCISION      Prior to Admission medications   Medication Sig Start Date End Date Taking? Authorizing Provider  albuterol (PROVENTIL HFA;VENTOLIN HFA) 108 (90 Base) MCG/ACT inhaler Inhale 2 puffs into the lungs every 6 (six) hours as needed for wheezing or shortness of breath. Patient not taking: Reported on 03/07/2017 06/30/16   Orbie Pyo,  MD  atorvastatin (LIPITOR) 40 MG tablet Take 1 tablet (40 mg total) by mouth daily at 6 PM. 11/25/15   Vaughan Basta, MD  cetirizine (ZYRTEC) 10 MG tablet Take 10 mg by mouth daily.    [provider]  Cholecalciferol (D3-1000 PO) Take 1,000 Units by mouth daily.     [provider]  Cinnamon 500 MG capsule Take 500 mg by mouth daily.     [provider]  cyclobenzaprine (FLEXERIL) 10 MG tablet Take 1 tablet by mouth every 8 (eight) hours as needed for muscle spasms.    [provider]  enoxaparin (LOVENOX) 80 MG/0.8ML injection Inject 80 mg into the skin daily. 02/22/17   [provider]  ferrous sulfate 325 (65 FE) MG EC tablet Take 325 mg by mouth daily.    [provider]  hydrALAZINE (APRESOLINE) 25 MG tablet Take 25 mg by mouth daily. 04/25/15   [provider]  levocetirizine (XYZAL) 5 MG tablet Take 1 tablet by mouth daily. 02/25/17   [provider]  losartan-hydrochlorothiazide (HYZAAR) 100-25 MG tablet Take 1 tablet by mouth daily. 04/25/15   [provider]  Omega-3 Fatty Acids (FISH OIL) 1000 MG CAPS Take 1 capsule by mouth daily.    [provider]  omeprazole (PRILOSEC) 40 MG capsule Take 1 capsule by mouth daily. 05/01/15   [provider]  triamcinolone (KENALOG) 0.025 % ointment Apply 1 application topically 2 (two) times daily. 04/18/17   Lannie Fields, PA-C  vitamin E (VITAMIN E) 400 UNIT  capsule Take 400 Units by mouth daily.    [provider]    Allergies Penicillins  Family History  Problem Relation Age of Onset  . Breast cancer Sister   . Heart disease Mother   . Congestive Heart Failure Sister     Social History Social History  Substance Use Topics  . Smoking status: Never Smoker  . Smokeless tobacco: Never Used  . Alcohol use No     Review of Systems  Constitutional: No fever/chills Eyes: No visual changes. No discharge ENT: No upper  respiratory complaints. Cardiovascular: no chest pain. Respiratory: no cough. No SOB. Gastrointestinal: No abdominal pain.  No nausea, no vomiting.  No diarrhea.  No constipation. Genitourinary: Negative for dysuria. No hematuria Musculoskeletal: Negative for musculoskeletal pain. Skin: Patient has eczema. Neurological: Negative for headaches, focal weakness or numbness.   ____________________________________________   PHYSICAL EXAM:  VITAL SIGNS: ED Triage Vitals [04/18/17 1545]  Enc Vitals Group     BP 139/63     Pulse Rate 73     Resp      Temp 98.7 F (37.1 C)     Temp Source Oral     SpO2 100 %     Weight 274 lb (124.3 kg)     Height 5\' 1"  (1.549 m)     Head Circumference      Peak Flow      Pain Score      Pain Loc      Pain Edu?      Excl. in [place]?      Constitutional: Alert and oriented. Well appearing and in no acute distress. Eyes: Conjunctivae are normal. PERRL. EOMI. Head: Atraumatic. Cardiovascular: Normal rate, regular rhythm. Normal S1 and S2.  Good peripheral circulation. Respiratory: Normal respiratory effort without tachypnea or retractions. Lungs CTAB. Good air entry to the bases with no decreased or absent breath sounds. Musculoskeletal: Full range of motion to all extremities. No gross deformities appreciated. Neurologic:  Normal speech and language. No gross focal neurologic deficits are appreciated.  Skin: Atopic dermatitis visualized at flexural surfaces of the bilateral arms, neck and inner thighs. Psychiatric: Mood and affect are normal. Speech and behavior are normal. Patient exhibits appropriate insight and judgement.   ____________________________________________   LABS (all labs ordered are listed, but only abnormal results are displayed)  Labs Reviewed - No data to  display ____________________________________________  EKG   ____________________________________________  RADIOLOGY   ____________________________________________    PROCEDURES  Procedure(s) performed:    Procedures    Medications - No data to display   ____________________________________________   INITIAL IMPRESSION / ASSESSMENT AND PLAN / ED COURSE  Pertinent labs & imaging results that were available during my care of the patient were reviewed by me and considered in my medical decision making (see chart for details).  Review of the [place] CSRS was performed in accordance of the [place] prior to dispensing any controlled drugs.    Assessment and plan Eczema Patient presents to the emergency department with eczema at the flexural surface of the of the bilateral arms, neck and inner thighs. Patient was discharged with triamcinolone cream. Vital signs are reassuring prior to discharge. All patient questions were answered.    ____________________________________________  FINAL CLINICAL IMPRESSION(S) / ED DIAGNOSES  Final diagnoses:  Flexural eczema      NEW MEDICATIONS STARTED DURING THIS VISIT:  New Prescriptions   TRIAMCINOLONE (KENALOG) 0.025 % OINTMENT    Apply 1 application topically 2 (two) times daily.  This chart was dictated using voice recognition software/Dragon. Despite best efforts to proofread, errors can occur which can change the meaning. Any change was purely unintentional.    Lannie Fields, PA-C 04/18/17 1812    Darel [name], MD 04/18/17 2015

## 2017-04-20 ENCOUNTER — Other Ambulatory Visit: Payer: Self-pay | Admitting: Nurse Practitioner

## 2017-04-20 DIAGNOSIS — Z1231 Encounter for screening mammogram for malignant neoplasm of breast: Secondary | ICD-10-CM

## 2017-04-26 ENCOUNTER — Other Ambulatory Visit: Payer: Self-pay

## 2017-05-13 ENCOUNTER — Encounter: Payer: Self-pay | Admitting: Nurse Practitioner

## 2017-05-13 ENCOUNTER — Other Ambulatory Visit: Payer: Self-pay | Admitting: Orthopedic Surgery

## 2017-05-13 DIAGNOSIS — M25512 Pain in left shoulder: Secondary | ICD-10-CM

## 2017-05-19 ENCOUNTER — Ambulatory Visit
Admission: RE | Admit: 2017-05-19 | Discharge: 2017-05-19 | Disposition: A | Discharge status: Home / Self Care | Payer: Medicare Other | Source: Ambulatory Visit | Attending: Orthopedic Surgery | Admitting: Orthopedic Surgery

## 2017-05-19 DIAGNOSIS — M7552 Bursitis of left shoulder: Secondary | ICD-10-CM | POA: Diagnosis not present

## 2017-05-19 DIAGNOSIS — M25512 Pain in left shoulder: Secondary | ICD-10-CM | POA: Diagnosis present

## 2017-05-25 ENCOUNTER — Other Ambulatory Visit: Payer: Self-pay | Admitting: Orthopedic Surgery

## 2017-05-25 DIAGNOSIS — M7122 Synovial cyst of popliteal space [Baker], left knee: Secondary | ICD-10-CM

## 2017-05-27 ENCOUNTER — Encounter: Payer: Self-pay | Admitting: Gastroenterology

## 2017-05-27 ENCOUNTER — Other Ambulatory Visit: Payer: Self-pay

## 2017-05-27 ENCOUNTER — Ambulatory Visit (INDEPENDENT_AMBULATORY_CARE_PROVIDER_SITE_OTHER): Payer: Medicare Other | Admitting: Gastroenterology

## 2017-05-27 VITALS — BP 113/76 | HR 74 | Temp 98.4°F | Ht 61.0 in | Wt 283.0 lb

## 2017-05-27 DIAGNOSIS — Z9989 Dependence on other enabling machines and devices: Secondary | ICD-10-CM

## 2017-05-27 DIAGNOSIS — G4730 Sleep apnea, unspecified: Secondary | ICD-10-CM | POA: Insufficient documentation

## 2017-05-27 DIAGNOSIS — Z800 Family history of malignant neoplasm of digestive organs: Secondary | ICD-10-CM

## 2017-05-27 DIAGNOSIS — M1990 Unspecified osteoarthritis, unspecified site: Secondary | ICD-10-CM | POA: Insufficient documentation

## 2017-05-27 DIAGNOSIS — R195 Other fecal abnormalities: Secondary | ICD-10-CM

## 2017-05-27 DIAGNOSIS — F250 Schizoaffective disorder, bipolar type: Secondary | ICD-10-CM

## 2017-05-27 DIAGNOSIS — G4733 Obstructive sleep apnea (adult) (pediatric): Secondary | ICD-10-CM | POA: Insufficient documentation

## 2017-05-27 DIAGNOSIS — J45909 Unspecified asthma, uncomplicated: Secondary | ICD-10-CM | POA: Insufficient documentation

## 2017-05-27 DIAGNOSIS — T7840XA Allergy, unspecified, initial encounter: Secondary | ICD-10-CM | POA: Insufficient documentation

## 2017-05-27 DIAGNOSIS — E785 Hyperlipidemia, unspecified: Secondary | ICD-10-CM | POA: Insufficient documentation

## 2017-05-27 DIAGNOSIS — F259 Schizoaffective disorder, unspecified: Secondary | ICD-10-CM | POA: Insufficient documentation

## 2017-05-27 NOTE — Progress Notes (Signed)
Cephas Darby, MD [address]  [place]  [place], [place] [address]  Main: [phone]  Fax: [phone]    Gastroenterology Consultation  Referring Provider:     Danelle Berry, NP Primary Care Physician:  Danelle Berry, NP Primary Gastroenterologist:  Dr. Cephas Darby Reason for Consultation:     Fecal occult positive        HPI:   Lindsay Cooke is a 56 y.o. y/o female referred by Dr. Danelle Berry, NP  to discuss about colonoscopy as she had fecal occult positive. She is morbidly obese, is going through a weight loss program and potentially to undergo weight loss surgery. She reports that she is trying hard to lose weight, unable to exercise because of severe arthritis of her right knee. She tends to fall back on snacking and high calorie foods chips and sodas. She reports having intermittent constipation, taking MiraLAX as needed. She has history of recurrent DVTs in right leg for the last 1 year, was initially placed on Coumadin, switch to Lovenox approximately 2 months ago. She takes PPI daily for heartburn which is under control. She denies any other GI symptoms  Grandmother with colon cancer She had hernia repair GI Procedures:  Colonoscopy 05/30/2008 3 mm polyp in ascending colon, 2.5 cm pedunculate polyp in descending colon, removed 1. COLON, POLYP(S), BIOPSY: POLYPOID COLORECTAL MUCOSA. NO ADENOMATOUS CHANGE OR MALIGNANCY IDENTIFIED.  2. COLON, DESCENDING, POLYP, BIOPSIES: FINDINGS CONSISTENT WITH JUVENILE POLYP.  Colonoscopy 07/20/2011 Normal  Past Medical History:  Diagnosis Date  . Anemia   . Asthma   . Benign essential hypertension   . Lumbar spondylosis   . Obesity   . Reflux   . Right leg DVT [facility])     Past Surgical History:  Procedure Laterality Date  . CHOLECYSTECTOMY    . HERNIA REPAIR    . KELOID EXCISION      Prior to Admission medications   Medication Sig Start Date End Date Taking? Authorizing Provider    albuterol (PROVENTIL HFA;VENTOLIN HFA) 108 (90 Base) MCG/ACT inhaler Inhale 2 puffs into the lungs every 6 (six) hours as needed for wheezing or shortness of breath. Patient not taking: Reported on 03/07/2017 06/30/16   Orbie Pyo, MD  atorvastatin (LIPITOR) 20 MG tablet  04/11/17   [provider]  atorvastatin (LIPITOR) 40 MG tablet Take 1 tablet (40 mg total) by mouth daily at 6 PM. 11/25/15   Vaughan Basta, MD  cetirizine (ZYRTEC) 10 MG tablet Take 10 mg by mouth daily.    [provider]  Cholecalciferol (D3-1000 PO) Take 1,000 Units by mouth daily.     [provider]  Cinnamon 500 MG capsule Take 500 mg by mouth daily.     [provider]  cyclobenzaprine (FLEXERIL) 10 MG tablet Take 1 tablet by mouth every 8 (eight) hours as needed for muscle spasms.    [provider]  dicyclomine (BENTYL) 10 MG capsule Take by mouth. 03/29/17 03/29/18  [provider]  enoxaparin (LOVENOX) 80 MG/0.8ML injection Inject 80 mg into the skin daily. 02/22/17   [provider]  ferrous sulfate 325 (65 FE) MG EC tablet Take 325 mg by mouth daily.    [provider]  Fluocinolone Acetonide Body 0.01 % OIL At bedtime :Wet scalp.  Apply oil after shaking bottle.Cover with cap.  Wash out in am. 05/04/17   [provider]  hydrALAZINE (APRESOLINE) 25 MG tablet Take 25 mg by  mouth daily. 04/25/15   [provider]  hydrOXYzine (ATARAX/VISTARIL) 25 MG tablet  05/06/17   [provider]  levocetirizine (XYZAL) 5 MG tablet Take 1 tablet by mouth daily. 02/25/17   [provider]  losartan-hydrochlorothiazide (HYZAAR) 100-25 MG tablet Take 1 tablet by mouth daily. 04/25/15   [provider]  metoprolol tartrate (LOPRESSOR) 100 MG tablet  03/17/17   [provider]  montelukast (SINGULAIR) 10 MG tablet Take by mouth.    [provider]  montelukast (SINGULAIR) 10 MG tablet   04/11/17   [provider]  [facility] powder  04/05/17   [provider]  Omega-3 Fatty Acids (FISH OIL) 1000 MG CAPS Take 1 capsule by mouth daily.    [provider]  omeprazole (PRILOSEC) 40 MG capsule Take 1 capsule by mouth daily. 05/01/15   [provider]  pantoprazole (PROTONIX) 20 MG tablet Take by mouth.    [provider]  sertraline (ZOLOFT) 25 MG tablet  03/10/17   [provider]  triamcinolone (KENALOG) 0.025 % ointment Apply 1 application topically 2 (two) times daily. 04/18/17   Lannie Fields, PA-C  vitamin E (VITAMIN E) 400 UNIT capsule Take 400 Units by mouth daily.    [provider]  warfarin (COUMADIN) 5 MG tablet  03/10/17   [provider]    Family History  Problem Relation Age of Onset  . Breast cancer Sister   . Heart disease Mother   . Congestive Heart Failure Sister      Social History  Substance Use Topics  . Smoking status: Never Smoker  . Smokeless tobacco: Never Used  . Alcohol use No    Allergies as of 05/27/2017 - Review Complete 05/27/2017  Allergen Reaction Noted  . Penicillins Hives and Rash 02/22/2015    Review of Systems:    All systems reviewed and negative except where noted in HPI.   Physical Exam:  BP 113/76   Pulse 74   Temp 98.4 F (36.9 C) (Oral)   Ht 5\' 1"  (1.549 m)   Wt 283 lb (128.4 kg)   BMI 53.47 kg/m  No LMP recorded. Patient has had a hysterectomy.  General:   Alert,  Well-developed, well-nourished, pleasant and cooperative in NAD, keloid on her right cheek Head:  Normocephalic and atraumatic. Eyes:  Sclera clear, no icterus.   Conjunctiva pink. Ears:  Normal auditory acuity. Nose:  No deformity, discharge, or lesions. Mouth:  No deformity or lesions,oropharynx pink & moist. Neck:  Supple; no masses or thyromegaly. Lungs:  Respirations even and unlabored.  Clear throughout to auscultation.   No wheezes, crackles, or rhonchi. No acute distress. Heart:   Regular rate and rhythm; no murmurs, clicks, rubs, or gallops. Abdomen:  Normal bowel sounds.  No bruits.  Soft, non-tender and non-distended without masses, hepatosplenomegaly or hernias noted.  No guarding or rebound tenderness.   Rectal: Nor performed Msk:  Symmetrical without gross deformities. Limited mobility of her right knee  Pulses:  Normal pulses noted. Extremities:  No clubbing or edema.  No cyanosis. Neurologic:  Alert and oriented x3;  grossly normal neurologically. Skin:  Intact without significant lesions or rashes. No jaundice. Lymph Nodes:  No significant cervical adenopathy. Psych:  Alert and cooperative. Normal mood and affect.  Imaging Studies: No abdominal imaging  Assessment and Plan:   Lindsay Cooke is a 56 y.o. black female with metabolic syndrome, morbid obesity, intermittent constipation, heartburn, right lower extremity DVTs, on anticoagulation with  Lovenox referred here to discuss about colonoscopy as her fecal occult was positive. She had 2 colonoscopies in the past, last one was 5 years ago.  Recommend repeat colonoscopy as she had fecal occult positive To be discussed with PCP about discontinuation of anticoagulation Hold Lovenox the day before procedure  Follow up based on colonoscopy findings  Cephas Darby, MD

## 2017-05-30 ENCOUNTER — Encounter: Payer: Self-pay | Admitting: Nurse Practitioner

## 2017-05-30 ENCOUNTER — Ambulatory Visit: Payer: Medicare Other | Attending: Nurse Practitioner | Admitting: Nurse Practitioner

## 2017-05-30 VITALS — BP 101/55 | HR 65 | Temp 98.4°F | Resp 16 | Ht 61.0 in | Wt 281.0 lb

## 2017-05-30 DIAGNOSIS — F209 Schizophrenia, unspecified: Secondary | ICD-10-CM | POA: Insufficient documentation

## 2017-05-30 DIAGNOSIS — Z86711 Personal history of pulmonary embolism: Secondary | ICD-10-CM | POA: Insufficient documentation

## 2017-05-30 DIAGNOSIS — Z9049 Acquired absence of other specified parts of digestive tract: Secondary | ICD-10-CM | POA: Insufficient documentation

## 2017-05-30 DIAGNOSIS — Z79899 Other long term (current) drug therapy: Secondary | ICD-10-CM | POA: Insufficient documentation

## 2017-05-30 DIAGNOSIS — E785 Hyperlipidemia, unspecified: Secondary | ICD-10-CM | POA: Diagnosis not present

## 2017-05-30 DIAGNOSIS — M899 Disorder of bone, unspecified: Secondary | ICD-10-CM

## 2017-05-30 DIAGNOSIS — F1190 Opioid use, unspecified, uncomplicated: Secondary | ICD-10-CM | POA: Insufficient documentation

## 2017-05-30 DIAGNOSIS — M25561 Pain in right knee: Secondary | ICD-10-CM | POA: Insufficient documentation

## 2017-05-30 DIAGNOSIS — Z8673 Personal history of transient ischemic attack (TIA), and cerebral infarction without residual deficits: Secondary | ICD-10-CM | POA: Insufficient documentation

## 2017-05-30 DIAGNOSIS — Z9889 Other specified postprocedural states: Secondary | ICD-10-CM | POA: Diagnosis not present

## 2017-05-30 DIAGNOSIS — M25569 Pain in unspecified knee: Secondary | ICD-10-CM

## 2017-05-30 DIAGNOSIS — G894 Chronic pain syndrome: Secondary | ICD-10-CM

## 2017-05-30 DIAGNOSIS — M25562 Pain in left knee: Secondary | ICD-10-CM | POA: Insufficient documentation

## 2017-05-30 DIAGNOSIS — G8929 Other chronic pain: Secondary | ICD-10-CM | POA: Diagnosis not present

## 2017-05-30 DIAGNOSIS — Z7409 Other reduced mobility: Secondary | ICD-10-CM | POA: Diagnosis not present

## 2017-05-30 DIAGNOSIS — M25519 Pain in unspecified shoulder: Secondary | ICD-10-CM

## 2017-05-30 DIAGNOSIS — G4733 Obstructive sleep apnea (adult) (pediatric): Secondary | ICD-10-CM | POA: Insufficient documentation

## 2017-05-30 DIAGNOSIS — I129 Hypertensive chronic kidney disease with stage 1 through stage 4 chronic kidney disease, or unspecified chronic kidney disease: Secondary | ICD-10-CM | POA: Diagnosis not present

## 2017-05-30 DIAGNOSIS — M25512 Pain in left shoulder: Secondary | ICD-10-CM

## 2017-05-30 DIAGNOSIS — M25511 Pain in right shoulder: Secondary | ICD-10-CM | POA: Diagnosis not present

## 2017-05-30 DIAGNOSIS — J45909 Unspecified asthma, uncomplicated: Secondary | ICD-10-CM | POA: Diagnosis not present

## 2017-05-30 DIAGNOSIS — M549 Dorsalgia, unspecified: Secondary | ICD-10-CM

## 2017-05-30 DIAGNOSIS — R7303 Prediabetes: Secondary | ICD-10-CM | POA: Insufficient documentation

## 2017-05-30 DIAGNOSIS — Z79891 Long term (current) use of opiate analgesic: Secondary | ICD-10-CM | POA: Diagnosis not present

## 2017-05-30 DIAGNOSIS — D649 Anemia, unspecified: Secondary | ICD-10-CM | POA: Diagnosis not present

## 2017-05-30 NOTE — Patient Instructions (Addendum)
____________________________________________________________________________________________  Appointment Policy Summary  It is our goal and responsibility to provide the medical community with assistance in the evaluation and management of patients with chronic pain. Unfortunately our resources are limited. Because we do not have an unlimited amount of time, or available appointments, we are required to closely monitor and manage their use. The following rules exist to maximize their use:  Patient's responsibilities: 1. Punctuality:  At what time should I arrive? You should be physically present in our office 30 minutes before your scheduled appointment. Your scheduled appointment is with your assigned healthcare provider. However, it takes 5-10 minutes to be "checked-in", and another 15 minutes for the nurses to do the admission. If you arrive to our office at the time you were given for your appointment, you will end up being at least 20-25 minutes late to your appointment with the provider. 2. Tardiness:  What happens if I arrive only a few minutes after my scheduled appointment time? You will need to reschedule your appointment. The cutoff is your appointment time. This is why it is so important that you arrive at least 30 minutes before that appointment. If you have an appointment scheduled for 10:00 AM and you arrive at 10:01, you will be required to reschedule your appointment.  3. Plan ahead:  Always assume that you will encounter traffic on your way in. Plan for it. If you are dependent on a driver, make sure they understand these rules and the need to arrive early. 4. Other appointments and responsibilities:  Avoid scheduling any other appointments before or after your pain clinic appointments.  5. Be prepared:  Write down everything that you need to discuss with your healthcare provider and give this information to the admitting nurse. Write down the medications that you will need  refilled. Bring your pills and bottles (even the empty ones), to all of your appointments, except for those where a procedure is scheduled. 6. No children or pets:  Find someone to take care of them. It is not appropriate to bring them in. 7. Scheduling changes:  We request "advanced notification" of any changes or cancellations. 8. Advanced notification:  Defined as a time period of more than 24 hours prior to the originally scheduled appointment. This allows for the appointment to be offered to other patients. 9. Rescheduling:  When a visit is rescheduled, it will require the cancellation of the original appointment. For this reason they both fall within the category of "Cancellations".  10. Cancellations:  They require advanced notification. Any cancellation less than 24 hours before the  appointment will be recorded as a "No Show". 11. No Show:  Defined as an unkept appointment where the patient failed to notify or declare to the practice their intention or inability to keep the appointment.  Corrective process for repeat offenders:  1. Tardiness: Three (3) episodes of rescheduling due to late arrivals will be recorded as one (1) "No Show". 2. Cancellation or reschedule: Three (3) cancellations or rescheduling will be recorded as one (1) "No Show". 3. "No Shows": Three (3) "No Shows" within a 12 month period will result in discharge from the practice.  ____________________________________________________________________________________________  ____________________________________________________________________________________________  Pain Scale  Introduction: The pain score used by this practice is the Verbal Numerical Rating Scale (VNRS-11). This is an 11-point scale. It is for adults and children 10 years or older. There are significant differences in how the pain score is reported, used, and applied. Forget everything you learned in the past and  learn this scoring  system.  General Information: The scale should reflect your current level of pain. Unless you are specifically asked for the level of your worst pain, or your average pain. If you are asked for one of these two, then it should be understood that it is over the past 24 hours.  Basic Activities of Daily Living (ADL): Personal hygiene, dressing, eating, transferring, and using restroom.  Instructions: Most patients tend to report their level of pain as a combination of two factors, their physical pain and their psychosocial pain. This last one is also known as "suffering" and it is reflection of how physical pain affects you socially and psychologically. From now on, report them separately. From this point on, when asked to report your pain level, report only your physical pain. Use the following table for reference.  Pain Clinic Pain Levels (0-5/10)  Pain Level Score  Description  No Pain 0   Mild pain 1 Nagging, annoying, but does not interfere with basic activities of daily living (ADL). Patients are able to eat, bathe, get dressed, toileting (being able to get on and off the toilet and perform personal hygiene functions), transfer (move in and out of bed or a chair without assistance), and maintain continence (able to control bladder and bowel functions). Blood pressure and heart rate are unaffected. A normal heart rate for a healthy adult ranges from 60 to 100 bpm (beats per minute).   Mild to moderate pain 2 Noticeable and distracting. Impossible to hide from other people. More frequent flare-ups. Still possible to adapt and function close to normal. It can be very annoying and may have occasional stronger flare-ups. With discipline, patients may get used to it and adapt.   Moderate pain 3 Interferes significantly with activities of daily living (ADL). It becomes difficult to feed, bathe, get dressed, get on and off the toilet or to perform personal hygiene functions. Difficult to get in and out of  bed or a chair without assistance. Very distracting. With effort, it can be ignored when deeply involved in activities.   Moderately severe pain 4 Impossible to ignore for more than a few minutes. With effort, patients may still be able to manage work or participate in some social activities. Very difficult to concentrate. Signs of autonomic nervous system discharge are evident: dilated pupils (mydriasis); mild sweating (diaphoresis); sleep interference. Heart rate becomes elevated (>115 bpm). Diastolic blood pressure (lower number) rises above 100 mmHg. Patients find relief in laying down and not moving.   Severe pain 5 Intense and extremely unpleasant. Associated with frowning face and frequent crying. Pain overwhelms the senses.  Ability to do any activity or maintain social relationships becomes significantly limited. Conversation becomes difficult. Pacing back and forth is common, as getting into a comfortable position is nearly impossible. Pain wakes you up from deep sleep. Physical signs will be obvious: pupillary dilation; increased sweating; goosebumps; brisk reflexes; cold, clammy hands and feet; nausea, vomiting or dry heaves; loss of appetite; significant sleep disturbance with inability to fall asleep or to remain asleep. When persistent, significant weight loss is observed due to the complete loss of appetite and sleep deprivation.  Blood pressure and heart rate becomes significantly elevated. Caution: If elevated blood pressure triggers a pounding headache associated with blurred vision, then the patient should immediately seek attention at an urgent or emergency care unit, as these may be signs of an impending stroke.    Emergency Department Pain Levels (6-10/10)  Emergency Room Pain 6   Severely limiting. Requires emergency care and should not be seen or managed at an outpatient pain management facility. Communication becomes difficult and requires great effort. Assistance to reach the  emergency department may be required. Facial flushing and profuse sweating along with potentially dangerous increases in heart rate and blood pressure will be evident.   Distressing pain 7 Self-care is very difficult. Assistance is required to transport, or use restroom. Assistance to reach the emergency department will be required. Tasks requiring coordination, such as bathing and getting dressed become very difficult.   Disabling pain 8 Self-care is no longer possible. At this level, pain is disabling. The individual is unable to do even the most "basic" activities such as walking, eating, bathing, dressing, transferring to a bed, or toileting. Fine motor skills are lost. It is difficult to think clearly.   Incapacitating pain 9 Pain becomes incapacitating. Thought processing is no longer possible. Difficult to remember your own name. Control of movement and coordination are lost.   The worst pain imaginable 10 At this level, most patients pass out from pain. When this level is reached, collapse of the autonomic nervous system occurs, leading to a sudden drop in blood pressure and heart rate. This in turn results in a temporary and dramatic drop in blood flow to the brain, leading to a loss of consciousness. Fainting is one of the body's self defense mechanisms. Passing out puts the brain in a calmed state and causes it to shut down for a while, in order to begin the healing process.    Summary: 1. Refer to this scale when providing [place] with your pain level. 2. Be accurate and careful when reporting your pain level. This will help with your care. 3. Over-reporting your pain level will lead to loss of credibility. 4. Even a level of 1/10 means that there is pain and will be treated at our facility. 5. High, inaccurate reporting will be documented as "Symptom Exaggeration", leading to loss of credibility and suspicions of possible secondary gains such as obtaining more narcotics, or wanting to appear  disabled, for fraudulent reasons. 6. Only pain levels of 5 or below will be seen at our facility. 7. Pain levels of 6 and above will be sent to the Emergency Department and the appointment cancelled. ____________________________________________________________________________________________  BMI Assessment: Estimated body mass index is 53.47 kg/m as calculated from the following:   Height as of 05/27/17: 5\' 1"  (1.549 m).   Weight as of 05/27/17: 283 lb (128.4 kg).  BMI interpretation table: BMI level Category Range association with higher incidence of chronic pain  <18 kg/m2 Underweight   18.5-24.9 kg/m2 Ideal body weight   25-29.9 kg/m2 Overweight Increased incidence by 20%  30-34.9 kg/m2 Obese (Class I) Increased incidence by 68%  35-39.9 kg/m2 Severe obesity (Class II) Increased incidence by 136%  >40 kg/m2 Extreme obesity (Class III) Increased incidence by 254%   BMI Readings from Last 4 Encounters:  05/27/17 53.47 kg/m  04/18/17 51.77 kg/m  03/07/17 54.42 kg/m  12/01/16 54.98 kg/m   Wt Readings from Last 4 Encounters:  05/27/17 283 lb (128.4 kg)  04/18/17 274 lb (124.3 kg)  03/07/17 288 lb (130.6 kg)  12/01/16 291 lb (132 kg)

## 2017-05-30 NOTE — Progress Notes (Signed)
Safety precautions to be maintained throughout the outpatient stay will include: orient to surroundings, keep bed in low position, maintain call bell within reach at all times, provide assistance with transfer out of bed and ambulation.  

## 2017-05-30 NOTE — Progress Notes (Signed)
Patient's Name: Lindsay Cooke  MRN: [id number]  Referring Provider: Thornton Park, MD  DOB: 1961/06/03  PCP: Danelle Berry, NP  DOS: 05/30/2017  Note by: Dionisio David NP  Service setting: Ambulatory outpatient  Specialty: Interventional Pain Management  Location: [facility] (AMB) Pain Management Facility    Patient type: New Patient    Primary Reason(s) for Visit: Initial Patient Evaluation CC: Knee Pain (bilateral) and Shoulder Pain (bilateral)  HPI  [name] is a 56 y.o. year old, female patient, who comes today for an initial evaluation. She has Morbid obesity ([place]); ANEMIA; Depression; Hypertension; ASTHMA; Gastroesophageal reflux disease without esophagitis; GASTROENTERITIS; GUAIAC POSITIVE STOOL; Keloid scar; Osteoarthritis of knee; FATIGUE; SNORING; MEDIAL MENISCUS TEAR, RIGHT; CVA (cerebral vascular accident) ([place]); Hemoptysis; Acute deep vein thrombosis (DVT) of proximal vein of right lower extremity ([place]); Allergic state; Arthritis; Asthma without status asthmaticus; Dyslipidemia; Mini stroke ([place]); Mild intermittent asthma without complication; Hyperlipidemia; History of pulmonary embolism; Hearing loss in right ear; Sleep apnea; Schizoaffective-type schizophrenia ([place]); Recurrent major depressive disorder, in remission ([place]); Pre-diabetes; OSA on CPAP; OSA (obstructive sleep apnea); Chronic knee pain (Primary Area of Pain) (Bilateral) (R>L); Chronic shoulder pain (Secondary Area of Pain) (Bilateral) (R>L); Chronic upper back pain [facility] Area of Pain); Chronic pain syndrome; Long term current use of opiate analgesic; Long term prescription opiate use; Opiate use; Other reduced mobility; and Disorder of bone, unspecified on her problem list.. Her primarily concern today is the Knee Pain (bilateral) and Shoulder Pain (bilateral)  Pain Assessment: Location: Right, Left Knee Radiating: Na Onset: More than a month ago Duration: Chronic pain Quality: Aching, Sharp Severity: 10-Worst  pain ever/10 (self-reported pain score)  Note: Reported level is compatible with observation. Clinically the patient looks like a 5/10 Information on the proper use of the pain scale provided to the patient today.  Effect on ADL: walking long distances, stairs, bending Timing:   Modifying factors: nothing  Onset and Duration: Gradual Cause of pain: Unknown Severity: Getting worse, NAS-11 at its worse: 7/10, NAS-11 at its best: 10/10, NAS-11 now: 9/10 and NAS-11 on the average: 9/10 Timing: Night Aggravating Factors: Motion, Prolonged sitting and Walking Alleviating Factors: Resting Associated Problems: Constipation, Pain that wakes patient up and Pain that does not allow patient to sleep Quality of Pain: Aching, Dreadful, Exhausting, Nagging and Tingling Previous Examinations or Tests: CT scan and X-rays Previous Treatments: The patient denies treatment  The patient comes into the clinics today for the first time for a chronic pain management evaluation. According to the patient her primary area of pain is in her knees. She admits that the right is greater than the left. She denies any swelling, numbness, tingling or weakness.She denies any previous surgeries She has had a steroid injection by [facility] in the past which was not effective. She denies any physical therapy. She has had recent images; xrays. She admits that she was told; she needs to lose weight prior to having knee replacement. She will be following up at the bariatric clinic.  Her second area pain is in her shoulders. She admits that the right is greater than the left. She feels like the pain is worse at night. She denies any numbness tingling or weakness in her arms. She denies any neck pain. She has had a left shoulder injection this year, [facility]. She states that it was not effective. She denies any physical therapy. She did have recent images.  Her third area of pain is in her upper back. She  feels the pain goes  across into her shoulder. She denies any previous surgeries, interventional therapy, physical therapy or recent images.  Today I took the time to provide the patient with information regarding this pain practice. The patient was informed that the practice is divided into two sections: an interventional pain management section, as well as a completely separate and distinct medication management section. I explained that there are procedure days for interventional therapies, and evaluation days for follow-ups and medication management. Because of the amount of documentation required during both, they are kept separated. This means that there is the possibility thathe may be scheduled for a procedure on one day, and medication management the next. I have also informed her that because of staffing and facility limitations, this practice will no longer take patients for medication management only. To illustrate the reasons for this, I gave the patient the example of surgeons, and how inappropriate it would be to refer a patient to her care, just to write for the post-surgical antibiotics on a surgery done by a different surgeon.   Because interventional pain management is part of the board-certified specialty for the doctors, the patient was informed that joining this practice means that they are open to any and all interventional therapies. I made it clear that this does not mean that they will be forced to have any procedures done. What this means is that I believe interventional therapies to be essential part of the diagnosis and proper management of chronic pain conditions. Therefore, patients not interested in these interventional alternatives will be better served under the care of a different practitioner.  The patient was also made aware of my Comprehensive Pain Management Safety Guidelines where by joining this practice, they limit all of their nerve blocks and joint injections to those done by our  practice, for as long as we are retained to manage their care. Historic Controlled Substance Pharmacotherapy Review  PMP and historical list of controlled substances: Virtussin AC, oxycodone/acetaminophen 5/325 mg,  tramadol 50 mg, hydrocodone/acetaminophen 5/325 mg, oxycodone 5 mg, lorazepam 0.5 mg,oxycodone/acetaminophen 10/650 mg, acetaminophen with codeine No. 3, Highest opioid analgesic regimen found: oxycodone/acetaminophen 10/650 mg,1 tablet 5 times daily, (fill date 04/28/2012) oxycodone 50 mg per day Most recent opioid analgesic: oxycodone/acetaminophen 5/325 mg 1 tablet 3 times daily (fill date 02/16/2016) oxycodone 15 mg per day Current opioid analgesics: None Highest recorded MME/day: 107 mg/day MME/day: 0 mg/day Medications: The patient did not bring the medication(s) to the appointment, as requested in our "New Patient Package" Pharmacodynamics: Desired effects: Analgesia: The patient reports >50% benefit. Reported improvement in function: The patient reports medication allows her to accomplish basic ADLs. Clinically meaningful improvement in function (CMIF): Sustained CMIF goals met Perceived effectiveness: Described as relatively effective, allowing for increase in activities of daily living (ADL) Undesirable effects: Side-effects or Adverse reactions: None reported Historical Monitoring: The patient  reports that she does not use drugs. List of all UDS Test(s): No results found for: MDMA, COCAINSCRNUR, PCPSCRNUR, PCPQUANT, CANNABQUANT, THCU, [place] List of all Serum Drug Screening Test(s):  No results found for: AMPHSCRSER, BARBSCRSER, BENZOSCRSER, COCAINSCRSER, PCPSCRSER, PCPQUANT, THCSCRSER, CANNABQUANT, OPIATESCRSER, OXYSCRSER, PROPOXSCRSER Historical Background Evaluation: [place] PDMP: Six (6) year initial data search conducted.             [place] Department of public safety, offender search: Editor, commissioning Information) Non-contributory Risk Assessment Profile: Aberrant behavior: None  observed or detected today Risk factors for fatal opioid overdose: age 53-25 years old, Benzodiazepine use, COPD or asthma,  schizophrenia, sleep apnea and None identified today Fatal overdose hazard ratio (HR): 2.04 for doses equal to, or higher than 100 MME/day Non-fatal overdose hazard ratio (HR): 1.44 for 20-49 MME/day Risk of opioid abuse or dependence: 0.7-3.0% with doses ? 36 MME/day and 6.1-26% with doses ? 120 MME/day. Substance use disorder (SUD) risk level: Pending results of Medical Psychology Evaluation for SUD Opioid risk tool (ORT) (Total Score): 0  ORT Scoring interpretation table:  Score <3 = Low Risk for SUD  Score between 4-7 = Moderate Risk for SUD  Score >8 = High Risk for Opioid Abuse   PHQ-2 Depression Scale:  Total score: 0  PHQ-2 Scoring interpretation table: (Score and probability of major depressive disorder)  Score 0 = No depression  Score 1 = 15.4% Probability  Score 2 = 21.1% Probability  Score 3 = 38.4% Probability  Score 4 = 45.5% Probability  Score 5 = 56.4% Probability  Score 6 = 78.6% Probability   PHQ-9 Depression Scale:  Total score: 0  PHQ-9 Scoring interpretation table:  Score 0-4 = No depression  Score 5-9 = Mild depression  Score 10-14 = Moderate depression  Score 15-19 = Moderately severe depression  Score 20-27 = Severe depression (2.4 times higher risk of SUD and 2.89 times higher risk of overuse)   Pharmacologic Plan: Pending ordered tests and/or consults  Meds  The patient has a current medication list which includes the following prescription(s): albuterol, atorvastatin, cetirizine, cholecalciferol, cinnamon, enoxaparin, ferrous sulfate, fluocinolone acetonide body, hydralazine, hydroxyzine, levocetirizine, losartan-hydrochlorothiazide, metoprolol tartrate, montelukast, nyamyc, fish oil, pantoprazole, sertraline, and vitamin e.  Current Outpatient Prescriptions on File Prior to Visit  Medication Sig  . albuterol (PROVENTIL  HFA;VENTOLIN HFA) 108 (90 Base) MCG/ACT inhaler Inhale 2 puffs into the lungs every 6 (six) hours as needed for wheezing or shortness of breath.  Marland Kitchen atorvastatin (LIPITOR) 20 MG tablet   . cetirizine (ZYRTEC) 10 MG tablet Take 10 mg by mouth daily.  . Cholecalciferol (D3-1000 PO) Take 1,000 Units by mouth daily.   . Cinnamon 500 MG capsule Take 500 mg by mouth daily.   Marland Kitchen enoxaparin (LOVENOX) 80 MG/0.8ML injection Inject 80 mg into the skin daily.  . ferrous sulfate 325 (65 FE) MG EC tablet Take 325 mg by mouth daily.  . Fluocinolone Acetonide Body 0.01 % OIL At bedtime :Wet scalp.  Apply oil after shaking bottle.Cover with cap.  Wash out in am.  . hydrALAZINE (APRESOLINE) 25 MG tablet Take 25 mg by mouth daily.  . hydrOXYzine (ATARAX/VISTARIL) 25 MG tablet   . levocetirizine (XYZAL) 5 MG tablet Take 1 tablet by mouth daily.  Marland Kitchen losartan-hydrochlorothiazide (HYZAAR) 100-25 MG tablet Take 1 tablet by mouth daily.  . metoprolol tartrate (LOPRESSOR) 100 MG tablet   . montelukast (SINGULAIR) 10 MG tablet Take by mouth.  Marland Kitchen NYAMYC powder   . Omega-3 Fatty Acids (FISH OIL) 1000 MG CAPS Take 1 capsule by mouth daily.  . pantoprazole (PROTONIX) 20 MG tablet Take by mouth.  . sertraline (ZOLOFT) 25 MG tablet   . vitamin E (VITAMIN E) 400 UNIT capsule Take 400 Units by mouth daily.   No current facility-administered medications on file prior to visit.    Imaging Review   Shoulder Imaging:  Shoulder-L MR wo contrast:  Results for orders placed during the hospital encounter of 05/19/17  MR SHOULDER LEFT WO CONTRAST   Narrative CLINICAL DATA:  Left shoulder pain while laying down for 1 month.  EXAM: MRI OF THE LEFT SHOULDER  WITHOUT CONTRAST  TECHNIQUE: Multiplanar, multisequence MR imaging of the shoulder was performed. No intravenous contrast was administered.  COMPARISON:  None.  FINDINGS: Rotator cuff: Moderate tendinosis of the supraspinatus tendon. Infraspinatus tendon is intact. Teres  minor tendon is intact. Subscapularis tendon is intact.  Muscles: No atrophy or fatty replacement of nor abnormal signal within, the muscles of the rotator cuff.  Biceps long head:  Intact.  Acromioclavicular Joint: Mild arthropathy of the acromioclavicular joint. Type II acromion. Small amount of subacromial/subdeltoid bursal fluid.  Glenohumeral Joint: No joint effusion.  No chondral defect.  Labrum: Grossly intact, but evaluation is limited by lack of intraarticular fluid.  Bones: No marrow signal abnormality. No fracture or dislocation. No aggressive osseous lesion.  Other: No fluid collection or hematoma.  IMPRESSION: 1. Moderate tendinosis of the supraspinatus tendon without a discrete tear. 2. Mild subacromial/subdeltoid bursitis.   Electronically Signed   By: Kathreen Devoid   On: 05/19/2017 10:27     Thoracic Imaging: Thoracic MR wo contrast:  Results for orders placed during the hospital encounter of 01/01/17  MR THORACIC SPINE WO CONTRAST   Narrative CLINICAL DATA:  Mid back pain radiating around the site and into the hips  EXAM: MRI THORACIC SPINE WITHOUT CONTRAST  TECHNIQUE: Multiplanar, multisequence MR imaging of the thoracic spine was performed. No intravenous contrast was administered.  COMPARISON:  None.  FINDINGS: Alignment:  Physiologic.  Vertebrae: No fracture, evidence of discitis, or bone lesion.  Cord:  Normal signal and morphology.  Paraspinal and other soft tissues: No paraspinal abnormality.  Disc levels:  Disc spaces:  Disc spaces are maintained.  T1-T2: No disc protrusion, foraminal stenosis or central canal stenosis.  T2-T3: No disc protrusion, foraminal stenosis or central canal stenosis.  T3-T4: No disc protrusion, foraminal stenosis or central canal stenosis.  T4-T5: No disc protrusion, foraminal stenosis or central canal stenosis.  T5-T6: No disc protrusion, foraminal stenosis or central  canal stenosis.  T6-T7: No disc protrusion, foraminal stenosis or central canal stenosis.  T7-T8: No disc protrusion, foraminal stenosis or central canal stenosis.  T8-T9: No disc protrusion, foraminal stenosis or central canal stenosis.  T9-T10: No disc protrusion, foraminal stenosis or central canal stenosis. Mild bilateral facet arthropathy.  T10-T11: No disc protrusion, foraminal stenosis or central canal stenosis. Mild bilateral facet arthropathy.  T11-T12: No disc protrusion, foraminal stenosis or central canal stenosis. Mild bilateral facet arthropathy.  IMPRESSION: 1. No significant disc protrusion, foraminal stenosis or central canal stenosis. 2. No acute osseous injury of the thoracic spine.   Electronically Signed   By: Kathreen Devoid   On: 01/01/2017 11:40    Lumbosacral Imaging:  Lumbar CT wo contrast:  Results for orders placed during the hospital encounter of 11/28/15  CT Lumbar Spine Wo Contrast   Narrative CLINICAL DATA:  56 year old female on Coumadin presenting with midline back pain. No known injury.  EXAM: CT LUMBAR SPINE WITHOUT CONTRAST  TECHNIQUE: Multidetector CT imaging of the lumbar spine was performed without intravenous contrast administration. Multiplanar CT image reconstructions were also generated.  COMPARISON:  CT dated 11/12/2012  FINDINGS: Evaluation of the solid organs is limited in the absence of intravenous contrast.  There is no acute fracture or subluxation of the lumbar spine. The vertebral body heights and disc spaces are maintained. There is mild degenerative changes with anterior spurring. The transverse and spinous processes are intact.  The paraspinal musculature appear unremarkable. No soft tissue or intramuscular hematoma identified. There is a 3 mm nonobstructing left  renal upper pole calculus. No hydronephrosis. Small focal area of hypodensity in the medial aspect of the right kidney is not  well characterized but may represent a cyst. Multiple calcific densities noted along the left gonadal vein.  The visualized abdominal aorta and IVC appear unremarkable. No dilated bowel noted in the visualized abdomen. The appendix appears unremarkable. There is no adenopathy.  IMPRESSION: No acute/ traumatic lumbar spine pathology. No soft tissue hematoma.   Electronically Signed   By: Anner Crete M.D.   On: 11/29/2015 00:30     Knee Imaging:  Knee-L MR w contrast:  Results for orders placed during the hospital encounter of 04/02/16  MR Knee Left  Wo Contrast   Narrative CLINICAL DATA:  Knee pain, no prior surgery, arthritis EXAM: MRI OF THE LEFT KNEE WITHOUT CONTRAST TECHNIQUE: Multiplanar, multisequence MR imaging of the knee was performed. No intravenous contrast was administered. COMPARISON:  None. FINDINGS: MENISCI Medial meniscus: Radial tear of the posterior horn of the medial meniscus adjacent to the meniscal root. Multiloculated cystic structure which appears to arise from the periphery of the posterior horn of the medial meniscus measuring approximately 2.7 x 1.2 cm likely reflecting a para meniscal cyst. Lateral meniscus:  Intact. LIGAMENTS Cruciates: Intact ACL and PCL. Severely expanded ACL with increased signal consistent with mucinous degeneration. Collaterals: Medial collateral ligament is intact. Lateral collateral ligament complex is intact. CARTILAGE Patellofemoral: Mild partial-thickness cartilage loss of the lateral patellofemoral compartment and medial patellofemoral compartment. Medial: High-grade partial-thickness cartilage loss of the medial femoral condyle and medial tibial plateau. Lateral: Mild partial-thickness cartilage loss of the lateral femoral condyle and lateral tibial plateau. Joint: Moderate joint effusion. Mild edema in Hoffa's fat. No plical thickening. Popliteal Fossa:  Small Baker cyst.  Intact popliteus  tendon. Extensor Mechanism: Intact quadriceps tendon. Moderate tendinosis of the proximal patellar tendon. Bones: No marrow signal abnormality. No fracture or dislocation. Marginal osteophytosis of the medial femorotibial compartment. Other: None IMPRESSION: 1. Tricompartmental cartilage abnormalities as described above, most severe in the medial femorotibial compartment. 2. Radial tear of the posterior horn of the medial meniscus adjacent to the meniscal root. 3. Moderate joint effusion. 4. Moderate tendinosis of the proximal patellar tendon. Electronically Signed   By: Kathreen Devoid   On: 04/02/2016 15:59   Knee-R DG 1-2 views:  Results for orders placed during the hospital encounter of 10/31/07  DG Knee 2 Views Right   Narrative Clinical Data: Pain.  RIGHT KNEE - 2 VIEW:  Findings: There is medial compartment joint space narrowing with osteophytes noted in the medial and patellofemoral compartments. No fracture or effusion.  IMPRESSION:   No acute finding in patient with medial and patellofemoral degenerative disease.  Provider: Margarita Mail   Knee-R DG 4 views:  Results for orders placed in visit on 01/19/05  DG Knee Complete 4 Views Right   Narrative * PRIOR REPORT IMPORTED FROM AN EXTERNAL SYSTEM    PRIOR REPORT IMPORTED FROM THE [facility] WORKFLOW SYSTEM   REASON FOR EXAM:  Pain  COMMENTS:   PROCEDURE:     DXR - DXR KNEE RT COMP WITH OBLIQUES  - Jan 19 2005  9:23AM   RESULT:     Multiple views of the RIGHT knee show no evidence of   fracture,  foreign body, or soft tissue swelling.   IMPRESSION:   Normal RIGHT knee.        Note: Available results from prior imaging studies were reviewed.  ROS  Cardiovascular History: High blood pressure, Heart valve problems and Blood thinners:  Anticoagulant Pulmonary or Respiratory History: Wheezing and difficulty taking a deep full breath (Asthma), Snoring , Coughing up mucus (Bronchitis) and Temporary stoppage of  breathing during sleep Neurological History: No reported neurological signs or symptoms such as seizures, abnormal skin sensations, urinary and/or fecal incontinence, being born with an abnormal open spine and/or a tethered spinal cord Review of Past Neurological Studies:  Results for orders placed or performed during the hospital encounter of 11/23/15  MR Brain Wo Contrast   Narrative   CLINICAL DATA:  Slurred speech.  Eye swelling.  Hemoptysis.  EXAM: MRI HEAD WITHOUT CONTRAST  TECHNIQUE: Multiplanar, multiecho pulse sequences of the brain and surrounding structures were obtained without intravenous contrast.  COMPARISON:  CT head 11/23/2015  FINDINGS: Two small areas of possible restricted diffusion in the right parietal cortex suggestive of small acute infarcts. These measure under 5 mm each. No other areas of restricted diffusion.  Negative for chronic ischemia. Negative for demyelinating disease. Cerebral white matter normal  Ventricle size normal.  Cerebral volume normal.  Negative for hemorrhage or mass. No shift of the midline structures.  Image quality degraded by mild motion  Paranasal sinuses clear.  Pituitary normal in size.  Normal orbit.  IMPRESSION: Probable small areas of acute infarct in the right parietal cortex. Otherwise negative.  Mild image quality degradation due to motion.   Electronically Signed   By: Franchot Gallo M.D.   On: 11/23/2015 16:44   CT Head Wo Contrast   Narrative   CLINICAL DATA:  Slurred speech.  EXAM: CT HEAD WITHOUT CONTRAST  TECHNIQUE: Contiguous axial images were obtained from the base of the skull through the vertex without intravenous contrast.  COMPARISON:  05/29/2014 head CT.  FINDINGS: The anterior temporal fossa is poorly visualized due to extensive streak artifact from the skull base. No evidence of parenchymal hemorrhage or extra-axial fluid collection. No mass lesion, mass effect, or midline  shift.  No CT evidence of acute infarct.  Cerebral volume is age appropriate. No ventriculomegaly.  The visualized paranasal sinuses are essentially clear. The mastoid air cells are unopacified. No evidence of calvarial fracture.  IMPRESSION: Negative head CT.  No acute intracranial abnormality.   Electronically Signed   By: Ilona Sorrel M.D.   On: 11/23/2015 13:42    Psychological-Psychiatric History: Anxiousness and Depressed Gastrointestinal History: Heartburn due to stomach pushing into lungs (Hiatal hernia) and Irregular, infrequent bowel movements (Constipation) Genitourinary History: No reported renal or genitourinary signs or symptoms such as difficulty voiding or producing urine, peeing blood, non-functioning kidney, kidney stones, difficulty emptying the bladder, difficulty controlling the flow of urine, or chronic kidney disease Hematological History: Weakness due to low blood hemoglobin or red blood cell count (Anemia), Brusing easily and Bleeding easily Endocrine History: No reported endocrine signs or symptoms such as high or low blood sugar, rapid heart rate due to high thyroid levels, obesity or weight gain due to slow thyroid or thyroid disease Rheumatologic History: No reported rheumatological signs and symptoms such as fatigue, joint pain, tenderness, swelling, redness, heat, stiffness, decreased range of motion, with or without associated rash Musculoskeletal History: Negative for myasthenia gravis, muscular dystrophy, multiple sclerosis or malignant hyperthermia Work History: Disabled  Allergies  Ms. Fyfe is allergic to penicillins.  Laboratory Chemistry  Inflammation Markers Lab Results  Component Value Date   ESRSEDRATE 48 (H) 11/24/2015   (CRP: Acute Phase) (ESR: Chronic Phase) Renal Function Markers  Lab Results  Component Value Date   BUN 21 (H) 12/01/2016   CREATININE 0.96 12/01/2016   GFRAA >60 12/01/2016   GFRNONAA >60 12/01/2016   Hepatic  Function Markers Lab Results  Component Value Date   AST 46 (H) 11/28/2015   ALT 61 (H) 11/28/2015   ALBUMIN 4.3 11/28/2015   ALKPHOS 95 11/28/2015   Electrolytes Lab Results  Component Value Date   NA 141 12/01/2016   K 3.8 12/01/2016   CL 103 12/01/2016   CALCIUM 9.5 12/01/2016   Neuropathy Markers No results found for: [id number] Bone Pathology Markers Lab Results  Component Value Date   ALKPHOS 95 11/28/2015   CALCIUM 9.5 12/01/2016   Coagulation Parameters Lab Results  Component Value Date   INR 2.64 12/01/2016   LABPROT 28.7 (H) 12/01/2016   PLT 241 12/01/2016   Cardiovascular Markers Lab Results  Component Value Date   BNP 34.0 12/01/2016   HGB 12.8 12/01/2016   HCT 36.3 12/01/2016   Note: Lab results reviewed.  [place]  Drug: Ms. Pardo  reports that she does not use drugs. Alcohol:  reports that she does not drink alcohol. Tobacco:  reports that she has never smoked. She has never used smokeless tobacco. Medical:  has a past medical history of Allergy; Anemia; Asthma; Benign essential hypertension; Hyperlipidemia; Lumbar spondylosis; Obesity; Reflux; and Right leg DVT ([place]). Family: family history includes Breast cancer in her sister; Congestive Heart Failure in her sister; Heart disease in her mother.  Past Surgical History:  Procedure Laterality Date  . CHOLECYSTECTOMY    . HEEL SPUR EXCISION    . HERNIA REPAIR    . KELOID EXCISION     Active Ambulatory Problems    Diagnosis Date Noted  . Morbid obesity ([place]) 07/21/2007  . ANEMIA 11/15/2007  . Depression 11/27/2007  . Hypertension 07/21/2007  . ASTHMA 07/21/2007  . Gastroesophageal reflux disease without esophagitis 09/06/2007  . GASTROENTERITIS 02/23/2008  . GUAIAC POSITIVE STOOL 05/07/2008  . Keloid scar 07/21/2007  . Osteoarthritis of knee 11/15/2007  . FATIGUE 10/03/2008  . SNORING 10/03/2008  . MEDIAL MENISCUS TEAR, RIGHT 11/20/2007  . CVA (cerebral vascular accident) ([place]) 11/23/2015   . Hemoptysis 11/23/2015  . Acute deep vein thrombosis (DVT) of proximal vein of right lower extremity ([place]) 11/23/2015  . Allergic state 05/27/2017  . Arthritis 05/27/2017  . Asthma without status asthmaticus 05/27/2017  . Dyslipidemia 07/20/2016  . Mini stroke ([place]) 12/17/2015  . Mild intermittent asthma without complication 01/77/9390  . Hyperlipidemia 05/27/2017  . History of pulmonary embolism 10/01/2015  . Hearing loss in right ear 08/18/2012  . Sleep apnea 05/27/2017  . Schizoaffective-type schizophrenia ([place]) 05/27/2017  . Recurrent major depressive disorder, in remission ([place]) 12/17/2015  . Pre-diabetes 12/17/2015  . OSA on CPAP 05/27/2017  . OSA (obstructive sleep apnea) 05/27/2017  . Chronic knee pain (Primary Area of Pain) (Bilateral) (R>L) 05/30/2017  . Chronic shoulder pain (Secondary Area of Pain) (Bilateral) (R>L) 05/30/2017  . Chronic upper back pain [facility] Area of Pain) 05/30/2017  . Chronic pain syndrome 05/30/2017  . Long term current use of opiate analgesic 05/30/2017  . Long term prescription opiate use 05/30/2017  . Opiate use 05/30/2017  . Other reduced mobility 05/30/2017  . Disorder of bone, unspecified 05/30/2017   Resolved Ambulatory Problems    Diagnosis Date Noted  . No Resolved Ambulatory Problems   Past Medical History:  Diagnosis Date  . Allergy   . Anemia   . Asthma   .  Benign essential hypertension   . Hyperlipidemia   . Lumbar spondylosis   . Obesity   . Reflux   . Right leg DVT ([place])    Constitutional Exam  General appearance: Well nourished, well developed, and well hydrated. In no apparent acute distress Vitals:   05/30/17 1023  BP: (!) 101/55  Pulse: 65  Resp: 16  Temp: 98.4 F (36.9 C)  SpO2: 98%  Weight: 281 lb (127.5 kg)  Height: _0  (1.549 m)   BMI Assessment: Estimated body mass index is 53.09 kg/m as calculated from the following:   Height as of this encounter: _1  (1.549 m).   Weight as of this  encounter: 281 lb (127.5 kg).  BMI interpretation table: BMI level Category Range association with higher incidence of chronic pain  <18 kg/m2 Underweight   18.5-24.9 kg/m2 Ideal body weight   25-29.9 kg/m2 Overweight Increased incidence by 20%  30-34.9 kg/m2 Obese (Class I) Increased incidence by 68%  35-39.9 kg/m2 Severe obesity (Class II) Increased incidence by 136%  >40 kg/m2 Extreme obesity (Class III) Increased incidence by 254%   BMI Readings from Last 4 Encounters:  05/30/17 53.09 kg/m  05/27/17 53.47 kg/m  04/18/17 51.77 kg/m  03/07/17 54.42 kg/m   Wt Readings from Last 4 Encounters:  05/30/17 281 lb (127.5 kg)  05/27/17 283 lb (128.4 kg)  04/18/17 274 lb (124.3 kg)  03/07/17 288 lb (130.6 kg)  Psych/Mental status: Alert, oriented x 3 (person, place, & time)       Eyes: PERLA Respiratory: No evidence of acute respiratory distress  Cervical Spine Exam  Inspection: No masses, redness, or swelling Alignment: Symmetrical Functional ROM: Unrestricted ROM      Stability: No instability detected Muscle strength & Tone: Functionally intact Sensory: Unimpaired Palpation: No palpable anomalies              Upper Extremity (UE) Exam    Side: Right upper extremity  Side: Left upper extremity  Inspection: No masses, redness, swelling, or asymmetry. No contractures  Inspection: No masses, redness, swelling, or asymmetry. No contractures  Functional ROM: Decreased ROM for shoulder  Functional ROM: Decreased ROM          Muscle strength & Tone: Functionally intact  Muscle strength & Tone: Functionally intact  Sensory: Unimpaired  Sensory: Unimpaired  Palpation: No palpable anomalies              Palpation: No palpable anomalies              Specialized Test(s): Deferred         Specialized Test(s): Deferred          Thoracic Spine Exam  Inspection: No masses, redness, or swelling Alignment: Symmetrical Functional ROM: Unrestricted ROM Stability: No instability  detected Sensory: Unimpaired Muscle strength & Tone: No palpable anomalies  Lumbar Spine Exam  Inspection: No masses, redness, or swelling Alignment: Symmetrical Functional ROM: Unrestricted ROM      Stability: No instability detected Muscle strength & Tone: Functionally intact Sensory: Unimpaired Palpation: No palpable anomalies       Provocative Tests: Lumbar Hyperextension and rotation test: evaluation deferred today       Patrick's Maneuver: evaluation deferred today                    Gait & Posture Assessment  Ambulation: Unassisted Gait: Relatively normal for age and body habitus Posture: WNL   Lower Extremity Exam    Side: Right lower extremity  Side: Left lower extremity  Inspection: No masses, redness, swelling, or asymmetry. No contractures  Inspection: No masses, redness, swelling, or asymmetry. No contractures  Functional ROM: Pain restricted ROM          Functional ROM: Pain restricted ROM          Muscle strength & Tone: Functionally intact  Muscle strength & Tone: Functionally intact  Sensory: Unimpaired  Sensory: Unimpaired  Palpation: Complains of area being tender to palpation  Palpation: Complains of area being tender to palpation   Assessment  Primary Diagnosis & Pertinent Problem List: The primary encounter diagnosis was Chronic pain of both knees. Diagnoses of Chronic pain of both shoulders, Chronic upper back pain (Tertiary Area of Pain), Chronic pain syndrome, Long term current use of opiate analgesic, Other reduced mobility, and Disorder of bone, unspecified were also pertinent to this visit.  Visit Diagnosis: 1. Chronic pain of both knees   2. Chronic pain of both shoulders   3. Chronic upper back pain [facility] Area of Pain)   4. Chronic pain syndrome   5. Long term current use of opiate analgesic   6. Other reduced mobility   7. Disorder of bone, unspecified    Plan of Care  Initial treatment plan:  Please be advised that as per protocol,  today's visit has been an evaluation only. We have not taken over the patient's controlled substance management.  Problem-specific plan: No problem-specific Assessment & Plan notes found for this encounter.  Ordered Lab-work, Procedure(s), Referral(s), & Consult(s): Orders Placed This Encounter  Procedures  . DG Thoracic Spine 2 View  . Compliance Drug Analysis, Ur  . Comp. Metabolic Panel (12)  . C-reactive protein  . Magnesium  . 25-Hydroxyvitamin D Lcms D2+D3  . Vitamin B12  . Sedimentation rate  . Ambulatory referral to Psychology   Pharmacotherapy: Medications ordered:  No orders of the defined types were placed in this encounter.  Medications administered during this visit: Ms. Labo had no medications administered during this visit.   Pharmacotherapy under consideration:  Opioid Analgesics: The patient was informed that there is no guarantee that she would be a candidate for opioid analgesics. The decision will be made following CDC guidelines. This decision will be based on the results of diagnostic studies, as well as Ms. Irving's risk profile.  Membrane stabilizer: To be determined at a later time Muscle relaxant: To be determined at a later time NSAID: To be determined at a later time Other analgesic(s): To be determined at a later time   Interventional therapies under consideration: Ms. Klingbeil was informed that there is no guarantee that she would be a candidate for interventional therapies. The decision will be based on the results of diagnostic studies, as well as Ms. Pelcher's risk profile.  Possible procedure(s): Diagnostic bilateral knee injections Diagnostic Bilateral genicular nerve block Possible bilateral knees radiofrequency ablation Diagnostic bilateral shoulder injection Diagnostic bilateral suprascapular nerve block Possible bilateral suprascapular radiofrequency ablation Diagnostic thoracic facet block   Provider-requested follow-up: Return for 2nd  Visit, w/ Dr. Dossie Arbour, after [facility] eval.  Future Appointments Date Time Provider [place]  05/31/2017 1:30 PM [facility]  06/02/2017 10:00 AM [facility] 2 [facility]  07/11/2017 11:15 AM [facility] LAB [facility] None  07/11/2017 11:30 AM Cammie Sickle, MD [facility] None    Primary Care Physician: Danelle Berry, NP Location: [facility] Facility Note by:  Date: 05/30/2017; Time: 3:41 PM  Pain Score Disclaimer: We use the  NRS-11 scale. This is a self-reported, subjective measurement of pain severity with only modest accuracy. It is used primarily to identify changes within a particular patient. It must be understood that outpatient pain scales are significantly less accurate that those used for research, where they can be applied under ideal controlled circumstances with minimal exposure to variables. In reality, the score is likely to be a combination of pain intensity and pain affect, where pain affect describes the degree of emotional arousal or changes in action readiness caused by the sensory experience of pain. Factors such as social and work situation, setting, emotional state, anxiety levels, expectation, and prior pain experience may influence pain perception and show large inter-individual differences that may also be affected by time variables.  Patient instructions provided during this appointment: Patient Instructions    ____________________________________________________________________________________________  Appointment Policy Summary  It is our goal and responsibility to provide the medical community with assistance in the evaluation and management of patients with chronic pain. Unfortunately our resources are limited. Because we do not have an unlimited amount of time, or available appointments, we are required to closely monitor and manage their use. The following rules exist to maximize their use:  Patient's  responsibilities: 1. Punctuality:  At what time should I arrive? You should be physically present in our office 30 minutes before your scheduled appointment. Your scheduled appointment is with your assigned healthcare provider. However, it takes 5-10 minutes to be "checked-in", and another 15 minutes for the nurses to do the admission. If you arrive to our office at the time you were given for your appointment, you will end up being at least 20-25 minutes late to your appointment with the provider. 2. Tardiness:  What happens if I arrive only a few minutes after my scheduled appointment time? You will need to reschedule your appointment. The cutoff is your appointment time. This is why it is so important that you arrive at least 30 minutes before that appointment. If you have an appointment scheduled for 10:00 AM and you arrive at 10:01, you will be required to reschedule your appointment.  3. Plan ahead:  Always assume that you will encounter traffic on your way in. Plan for it. If you are dependent on a driver, make sure they understand these rules and the need to arrive early. 4. Other appointments and responsibilities:  Avoid scheduling any other appointments before or after your pain clinic appointments.  5. Be prepared:  Write down everything that you need to discuss with your healthcare provider and give this information to the admitting nurse. Write down the medications that you will need refilled. Bring your pills and bottles (even the empty ones), to all of your appointments, except for those where a procedure is scheduled. 6. No children or pets:  Find someone to take care of them. It is not appropriate to bring them in. 7. Scheduling changes:  We request "advanced notification" of any changes or cancellations. 8. Advanced notification:  Defined as a time period of more than 24 hours prior to the originally scheduled appointment. This allows for the appointment to be offered to other  patients. 9. Rescheduling:  When a visit is rescheduled, it will require the cancellation of the original appointment. For this reason they both fall within the category of "Cancellations".  10. Cancellations:  They require advanced notification. Any cancellation less than 24 hours before the  appointment will be recorded as a "No Show". 11. No Show:  Defined as an unkept appointment  where the patient failed to notify or declare to the practice their intention or inability to keep the appointment.  Corrective process for repeat offenders:  1. Tardiness: Three (3) episodes of rescheduling due to late arrivals will be recorded as one (1) "No Show". 2. Cancellation or reschedule: Three (3) cancellations or rescheduling will be recorded as one (1) "No Show". 3. "No Shows": Three (3) "No Shows" within a 12 month period will result in discharge from the practice.  ____________________________________________________________________________________________  ____________________________________________________________________________________________  Pain Scale  Introduction: The pain score used by this practice is the Verbal Numerical Rating Scale (VNRS-11). This is an 11-point scale. It is for adults and children 10 years or older. There are significant differences in how the pain score is reported, used, and applied. Forget everything you learned in the past and learn this scoring system.  General Information: The scale should reflect your current level of pain. Unless you are specifically asked for the level of your worst pain, or your average pain. If you are asked for one of these two, then it should be understood that it is over the past 24 hours.  Basic Activities of Daily Living (ADL): Personal hygiene, dressing, eating, transferring, and using restroom.  Instructions: Most patients tend to report their level of pain as a combination of two factors, their physical pain and their  psychosocial pain. This last one is also known as "suffering" and it is reflection of how physical pain affects you socially and psychologically. From now on, report them separately. From this point on, when asked to report your pain level, report only your physical pain. Use the following table for reference.  Pain Clinic Pain Levels (0-5/10)  Pain Level Score  Description  No Pain 0   Mild pain 1 Nagging, annoying, but does not interfere with basic activities of daily living (ADL). Patients are able to eat, bathe, get dressed, toileting (being able to get on and off the toilet and perform personal hygiene functions), transfer (move in and out of bed or a chair without assistance), and maintain continence (able to control bladder and bowel functions). Blood pressure and heart rate are unaffected. A normal heart rate for a healthy adult ranges from 60 to 100 bpm (beats per minute).   Mild to moderate pain 2 Noticeable and distracting. Impossible to hide from other people. More frequent flare-ups. Still possible to adapt and function close to normal. It can be very annoying and may have occasional stronger flare-ups. With discipline, patients may get used to it and adapt.   Moderate pain 3 Interferes significantly with activities of daily living (ADL). It becomes difficult to feed, bathe, get dressed, get on and off the toilet or to perform personal hygiene functions. Difficult to get in and out of bed or a chair without assistance. Very distracting. With effort, it can be ignored when deeply involved in activities.   Moderately severe pain 4 Impossible to ignore for more than a few minutes. With effort, patients may still be able to manage work or participate in some social activities. Very difficult to concentrate. Signs of autonomic nervous system discharge are evident: dilated pupils (mydriasis); mild sweating (diaphoresis); sleep interference. Heart rate becomes elevated (>115 bpm). Diastolic blood  pressure (lower number) rises above 100 mmHg. Patients find relief in laying down and not moving.   Severe pain 5 Intense and extremely unpleasant. Associated with frowning face and frequent crying. Pain overwhelms the senses.  Ability to do any activity or maintain social  relationships becomes significantly limited. Conversation becomes difficult. Pacing back and forth is common, as getting into a comfortable position is nearly impossible. Pain wakes you up from deep sleep. Physical signs will be obvious: pupillary dilation; increased sweating; goosebumps; brisk reflexes; cold, clammy hands and feet; nausea, vomiting or dry heaves; loss of appetite; significant sleep disturbance with inability to fall asleep or to remain asleep. When persistent, significant weight loss is observed due to the complete loss of appetite and sleep deprivation.  Blood pressure and heart rate becomes significantly elevated. Caution: If elevated blood pressure triggers a pounding headache associated with blurred vision, then the patient should immediately seek attention at an urgent or emergency care unit, as these may be signs of an impending stroke.    Emergency Department Pain Levels (6-10/10)  Emergency Room Pain 6 Severely limiting. Requires emergency care and should not be seen or managed at an outpatient pain management facility. Communication becomes difficult and requires great effort. Assistance to reach the emergency department may be required. Facial flushing and profuse sweating along with potentially dangerous increases in heart rate and blood pressure will be evident.   Distressing pain 7 Self-care is very difficult. Assistance is required to transport, or use restroom. Assistance to reach the emergency department will be required. Tasks requiring coordination, such as bathing and getting dressed become very difficult.   Disabling pain 8 Self-care is no longer possible. At this level, pain is disabling. The  individual is unable to do even the most "basic" activities such as walking, eating, bathing, dressing, transferring to a bed, or toileting. Fine motor skills are lost. It is difficult to think clearly.   Incapacitating pain 9 Pain becomes incapacitating. Thought processing is no longer possible. Difficult to remember your own name. Control of movement and coordination are lost.   The worst pain imaginable 10 At this level, most patients pass out from pain. When this level is reached, collapse of the autonomic nervous system occurs, leading to a sudden drop in blood pressure and heart rate. This in turn results in a temporary and dramatic drop in blood flow to the brain, leading to a loss of consciousness. Fainting is one of the body's self defense mechanisms. Passing out puts the brain in a calmed state and causes it to shut down for a while, in order to begin the healing process.    Summary: 1. Refer to this scale when providing [place] with your pain level. 2. Be accurate and careful when reporting your pain level. This will help with your care. 3. Over-reporting your pain level will lead to loss of credibility. 4. Even a level of 1/10 means that there is pain and will be treated at our facility. 5. High, inaccurate reporting will be documented as "Symptom Exaggeration", leading to loss of credibility and suspicions of possible secondary gains such as obtaining more narcotics, or wanting to appear disabled, for fraudulent reasons. 6. Only pain levels of 5 or below will be seen at our facility. 7. Pain levels of 6 and above will be sent to the Emergency Department and the appointment cancelled. ____________________________________________________________________________________________  BMI Assessment: Estimated body mass index is 53.47 kg/m as calculated from the following:   Height as of 05/27/17: _0  (1.549 m).   Weight as of 05/27/17: 283 lb (128.4 kg).  BMI interpretation table: BMI level  Category Range association with higher incidence of chronic pain  <18 kg/m2 Underweight   18.5-24.9 kg/m2 Ideal body weight   25-29.9 kg/m2 Overweight  Increased incidence by 20%  30-34.9 kg/m2 Obese (Class I) Increased incidence by 68%  35-39.9 kg/m2 Severe obesity (Class II) Increased incidence by 136%  >40 kg/m2 Extreme obesity (Class III) Increased incidence by 254%   BMI Readings from Last 4 Encounters:  05/27/17 53.47 kg/m  04/18/17 51.77 kg/m  03/07/17 54.42 kg/m  12/01/16 54.98 kg/m   Wt Readings from Last 4 Encounters:  05/27/17 283 lb (128.4 kg)  04/18/17 274 lb (124.3 kg)  03/07/17 288 lb (130.6 kg)  12/01/16 291 lb (132 kg)

## 2017-05-31 ENCOUNTER — Ambulatory Visit
Admission: RE | Admit: 2017-05-31 | Discharge: 2017-05-31 | Disposition: A | Discharge status: Home / Self Care | Payer: Medicare Other | Source: Ambulatory Visit | Attending: Orthopedic Surgery | Admitting: Orthopedic Surgery

## 2017-05-31 DIAGNOSIS — M7122 Synovial cyst of popliteal space [Baker], left knee: Secondary | ICD-10-CM | POA: Insufficient documentation

## 2017-05-31 NOTE — Discharge Instructions (Signed)
Baker Cyst A Baker cyst, also called a popliteal cyst, is a sac-like growth that forms at the back of the knee. The cyst forms when the fluid-filled sac (bursa) that cushions the knee joint becomes enlarged. The bursa that becomes a Baker cyst is located at the back of the knee joint. What are the causes? In most cases, a Baker cyst results from another knee problem that causes swelling inside the knee. This makes the fluid inside the knee joint (synovial fluid) flow into the bursa behind the knee, causing the bursa to enlarge. What increases the risk? You may be more likely to develop a Baker cyst if you already have a knee problem, such as:  A tear in cartilage that cushions the knee joint (meniscal tear).  A tear in the tissues that connect the bones of the knee joint (ligament tear).  Knee swelling from osteoarthritis, rheumatoid arthritis, or gout.  What are the signs or symptoms? A Baker cyst does not always cause symptoms. A lump behind the knee may be the only sign of the condition. The lump may be painful, especially when the knee is straightened. If the lump is painful, the pain may come and go. The knee may also be stiff. Symptoms may quickly get more severe if the cyst breaks open (ruptures). If your cyst ruptures, signs and symptoms may affect the knee and the back of the lower leg (calf) and may include:  Sudden or worsening pain.  Swelling.  Bruising.  How is this diagnosed? This condition may be diagnosed based on your symptoms and medical history. Your health care provider will also do a physical exam. This may include:  Feeling the cyst to check whether it is tender.  Checking your knee for signs of another knee condition that causes swelling.  You may have imaging tests, such as:  X-rays.  MRI.  Ultrasound.  How is this treated? A Baker cyst that is not painful may go away without treatment. If the cyst gets large or painful, it will likely get better if the  underlying knee problem is treated. Treatment for a Baker cyst may include:  Resting.  Keeping weight off of the knee. This means not leaning on the knee to support your body weight.  NSAIDs to reduce pain and swelling.  A procedure to drain the fluid from the cyst with a needle (aspiration). You may also get an injection of a medicine that reduces swelling (steroid).  Surgery. This may be needed if other treatments do not work. This usually involves correcting knee damage and removing the cyst.  Follow these instructions at home:  Take over-the-counter and prescription medicines only as told by your health care provider.  Rest and return to your normal activities as told by your health care provider. Avoid activities that make pain or swelling worse. Ask your health care provider what activities are safe for you.  Keep all follow-up visits as told by your health care provider. This is important. Contact a health care provider if:  You have knee pain, stiffness, or swelling that does not get better. Get help right away if:  You have sudden or worsening pain and swelling in your calf area. This information is not intended to replace advice given to you by your health care provider. Make sure you discuss any questions you have with your health care provider. Document Released: 08/23/2005 Document Revised: 05/13/2016 Document Reviewed: 05/13/2016 Elsevier Interactive Patient Education  2018 [employer]  

## 2017-06-02 ENCOUNTER — Ambulatory Visit
Admission: RE | Admit: 2017-06-02 | Discharge: 2017-06-02 | Disposition: A | Discharge status: Home / Self Care | Payer: Medicare Other | Source: Ambulatory Visit | Attending: Nurse Practitioner | Admitting: Nurse Practitioner

## 2017-06-02 DIAGNOSIS — Z1231 Encounter for screening mammogram for malignant neoplasm of breast: Secondary | ICD-10-CM | POA: Diagnosis not present

## 2017-06-03 LAB — COMP. METABOLIC PANEL (12)
ALBUMIN: 4.5 g/dL (ref 3.5–5.5)
ALK PHOS: 66 IU/L (ref 39–117)
AST: 19 IU/L (ref 0–40)
Albumin/Globulin Ratio: 2 (ref 1.2–2.2)
BUN/Creatinine Ratio: 19 (ref 9–23)
BUN: 18 mg/dL (ref 6–24)
Bilirubin Total: 0.5 mg/dL (ref 0.0–1.2)
CALCIUM: 10 mg/dL (ref 8.7–10.2)
CREATININE: 0.95 mg/dL (ref 0.57–1.00)
Chloride: 104 mmol/L (ref 96–106)
GFR, EST AFRICAN AMERICAN: 79 mL/min/{1.73_m2} (ref >59)
GFR, EST NON AFRICAN AMERICAN: 69 mL/min/{1.73_m2} (ref >59)
GLUCOSE: 96 mg/dL (ref 65–99)
Globulin, Total: 2.2 g/dL (ref 1.5–4.5)
Potassium: 3.9 mmol/L (ref 3.5–5.2)
SODIUM: 145 mmol/L — AB (ref 134–144)
Total Protein: 6.7 g/dL (ref 6.0–8.5)

## 2017-06-03 LAB — C-REACTIVE PROTEIN: CRP: 3.6 mg/L (ref 0.0–4.9)

## 2017-06-03 LAB — MAGNESIUM: MAGNESIUM: 2 mg/dL (ref 1.6–2.3)

## 2017-06-03 LAB — SEDIMENTATION RATE: SED RATE: 9 mm/h (ref 0–40)

## 2017-06-03 LAB — VITAMIN B12: VITAMIN B 12: 387 pg/mL (ref 232–1245)

## 2017-06-03 LAB — 25-HYDROXY VITAMIN D LCMS D2+D3: 25-Hydroxy, Vitamin D-2: <1 ng/mL

## 2017-06-03 LAB — 25-HYDROXYVITAMIN D LCMS D2+D3
25-HYDROXY, VITAMIN D-3: 36 ng/mL
25-HYDROXY, VITAMIN D: 36 ng/mL

## 2017-06-04 LAB — COMPLIANCE DRUG ANALYSIS, UR

## 2017-06-10 ENCOUNTER — Other Ambulatory Visit: Payer: Self-pay

## 2017-06-10 ENCOUNTER — Telehealth: Payer: Self-pay

## 2017-06-10 NOTE — Telephone Encounter (Signed)
Patient has been asked to stop Lovenox 2 days prior to scheduled colonoscopy and resume 2 days after-per Dr. Yevette Edwards.  Thanks [employer]

## 2017-06-16 ENCOUNTER — Encounter
Admission: RE | Disposition: A | Discharge status: Home / Self Care | Payer: Self-pay | Source: Ambulatory Visit | Attending: Gastroenterology

## 2017-06-16 ENCOUNTER — Ambulatory Visit: Payer: Medicare Other | Admitting: Certified Registered Nurse Anesthetist

## 2017-06-16 ENCOUNTER — Ambulatory Visit
Admission: RE | Admit: 2017-06-16 | Discharge: 2017-06-16 | Disposition: A | Discharge status: Home / Self Care | Payer: Medicare Other | Source: Ambulatory Visit | Attending: Gastroenterology | Admitting: Gastroenterology

## 2017-06-16 ENCOUNTER — Encounter: Payer: Self-pay | Admitting: *Deleted

## 2017-06-16 DIAGNOSIS — K219 Gastro-esophageal reflux disease without esophagitis: Secondary | ICD-10-CM | POA: Insufficient documentation

## 2017-06-16 DIAGNOSIS — R195 Other fecal abnormalities: Secondary | ICD-10-CM | POA: Diagnosis not present

## 2017-06-16 DIAGNOSIS — Z800 Family history of malignant neoplasm of digestive organs: Secondary | ICD-10-CM

## 2017-06-16 DIAGNOSIS — I739 Peripheral vascular disease, unspecified: Secondary | ICD-10-CM | POA: Insufficient documentation

## 2017-06-16 DIAGNOSIS — J45909 Unspecified asthma, uncomplicated: Secondary | ICD-10-CM | POA: Insufficient documentation

## 2017-06-16 DIAGNOSIS — D122 Benign neoplasm of ascending colon: Secondary | ICD-10-CM | POA: Diagnosis not present

## 2017-06-16 DIAGNOSIS — I10 Essential (primary) hypertension: Secondary | ICD-10-CM | POA: Diagnosis not present

## 2017-06-16 DIAGNOSIS — Z7901 Long term (current) use of anticoagulants: Secondary | ICD-10-CM | POA: Diagnosis not present

## 2017-06-16 DIAGNOSIS — D649 Anemia, unspecified: Secondary | ICD-10-CM | POA: Diagnosis not present

## 2017-06-16 DIAGNOSIS — Z86718 Personal history of other venous thrombosis and embolism: Secondary | ICD-10-CM | POA: Insufficient documentation

## 2017-06-16 DIAGNOSIS — D123 Benign neoplasm of transverse colon: Secondary | ICD-10-CM | POA: Diagnosis not present

## 2017-06-16 DIAGNOSIS — E785 Hyperlipidemia, unspecified: Secondary | ICD-10-CM | POA: Insufficient documentation

## 2017-06-16 DIAGNOSIS — Z79899 Other long term (current) drug therapy: Secondary | ICD-10-CM | POA: Insufficient documentation

## 2017-06-16 DIAGNOSIS — K635 Polyp of colon: Secondary | ICD-10-CM | POA: Diagnosis not present

## 2017-06-16 DIAGNOSIS — Z8673 Personal history of transient ischemic attack (TIA), and cerebral infarction without residual deficits: Secondary | ICD-10-CM | POA: Diagnosis not present

## 2017-06-16 DIAGNOSIS — Z9989 Dependence on other enabling machines and devices: Secondary | ICD-10-CM | POA: Diagnosis not present

## 2017-06-16 DIAGNOSIS — K621 Rectal polyp: Secondary | ICD-10-CM | POA: Diagnosis not present

## 2017-06-16 HISTORY — PX: COLONOSCOPY WITH PROPOFOL: SHX5780

## 2017-06-16 SURGERY — COLONOSCOPY WITH PROPOFOL
Anesthesia: General

## 2017-06-16 MED ORDER — LIDOCAINE HCL (CARDIAC) 20 MG/ML IV SOLN
INTRAVENOUS | Status: DC | PRN
  Administered 2017-06-16: 100 mg via INTRAVENOUS

## 2017-06-16 MED ORDER — SUCCINYLCHOLINE CHLORIDE 20 MG/ML IJ SOLN
INTRAMUSCULAR | Status: AC
  Filled 2017-06-16: qty 1

## 2017-06-16 MED ORDER — EPHEDRINE SULFATE 50 MG/ML IJ SOLN
INTRAMUSCULAR | Status: AC
  Filled 2017-06-16: qty 1

## 2017-06-16 MED ORDER — PROPOFOL 10 MG/ML IV BOLUS
INTRAVENOUS | Status: DC | PRN
  Administered 2017-06-16: 50 mg via INTRAVENOUS
  Administered 2017-06-16: 30 mg via INTRAVENOUS

## 2017-06-16 MED ORDER — LIDOCAINE HCL (PF) 2 % IJ SOLN
INTRAMUSCULAR | Status: AC
  Filled 2017-06-16: qty 10

## 2017-06-16 MED ORDER — PROPOFOL 500 MG/50ML IV EMUL
INTRAVENOUS | Status: AC
  Filled 2017-06-16: qty 50

## 2017-06-16 MED ORDER — SODIUM CHLORIDE 0.9 % IV SOLN
INTRAVENOUS | Status: DC
  Administered 2017-06-16: 1000 mL via INTRAVENOUS

## 2017-06-16 MED ORDER — GLYCOPYRROLATE 0.2 MG/ML IJ SOLN
INTRAMUSCULAR | Status: AC
  Filled 2017-06-16: qty 1

## 2017-06-16 MED ORDER — PROPOFOL 500 MG/50ML IV EMUL
INTRAVENOUS | Status: DC | PRN
  Administered 2017-06-16: 150 ug/kg/min via INTRAVENOUS

## 2017-06-16 MED ORDER — PROPOFOL 10 MG/ML IV BOLUS
INTRAVENOUS | Status: AC
  Filled 2017-06-16: qty 20

## 2017-06-16 MED ORDER — PHENYLEPHRINE HCL 10 MG/ML IJ SOLN
INTRAMUSCULAR | Status: AC
  Filled 2017-06-16: qty 1

## 2017-06-16 NOTE — Anesthesia Preprocedure Evaluation (Signed)
Anesthesia Evaluation  Patient identified by MRN, date of birth, ID band Patient awake    Reviewed: Allergy & Precautions, H&P , NPO status , Patient's Chart, lab work & pertinent test results, reviewed documented beta blocker date and time   Airway Mallampati: III   Neck ROM: full    Dental  (+) Poor Dentition, Teeth Intact   Pulmonary neg pulmonary ROS, neg shortness of breath, asthma , sleep apnea and Continuous Positive Airway Pressure Ventilation ,    Pulmonary exam normal        Cardiovascular Exercise Tolerance: Poor hypertension, On Medications + Peripheral Vascular Disease  negative cardio ROS Normal cardiovascular exam Rhythm:regular Rate:Normal     Neuro/Psych PSYCHIATRIC DISORDERS CVA, No Residual Symptoms negative neurological ROS  negative psych ROS   GI/Hepatic negative GI ROS, Neg liver ROS, Medicated,  Endo/Other  negative endocrine ROS  Renal/GU negative Renal ROS  negative genitourinary   Musculoskeletal   Abdominal   Peds  Hematology negative hematology ROS (+) anemia ,   Anesthesia Other Findings Past Medical History: No date: Allergy No date: Anemia No date: Asthma No date: Benign essential hypertension No date: Hyperlipidemia No date: Lumbar spondylosis No date: Obesity No date: Reflux No date: Right leg DVT (HCC) Past Surgical History: No date: CHOLECYSTECTOMY No date: HEEL SPUR EXCISION No date: HERNIA REPAIR No date: KELOID EXCISION BMI    Body Mass Index:  53.47 kg/m     Reproductive/Obstetrics negative OB ROS                             Anesthesia Physical Anesthesia Plan  ASA: III  Anesthesia Plan: General   Post-op Pain Management:    Induction:   PONV Risk Score and Plan:   Airway Management Planned:   Additional Equipment:   Intra-op Plan:   Post-operative Plan:   Informed Consent: I have reviewed the patients History and  Physical, chart, labs and discussed the procedure including the risks, benefits and alternatives for the proposed anesthesia with the patient or authorized representative who has indicated his/her understanding and acceptance.   Dental Advisory Given  Plan Discussed with: CRNA  Anesthesia Plan Comments:         Anesthesia Quick Evaluation

## 2017-06-16 NOTE — Anesthesia Post-op Follow-up Note (Signed)
Anesthesia QCDR form completed.        

## 2017-06-16 NOTE — Transfer of Care (Signed)
Immediate Anesthesia Transfer of Care Note  Patient: Lindsay Cooke  Procedure(s) Performed: COLONOSCOPY WITH PROPOFOL (N/A )  Patient Location: PACU  Anesthesia Type:General  Level of Consciousness: awake, alert , oriented and patient cooperative  Airway & Oxygen Therapy: Patient Spontanous Breathing and Patient connected to nasal cannula oxygen  Post-op Assessment: Report given to RN, Post -op Vital signs reviewed and stable and Patient moving all extremities X 4  Post vital signs: Reviewed and stable  Last Vitals:  Vitals:   06/16/17 0718 06/16/17 0904  BP: 123/82 124/73  Pulse: 82 71  Resp: 18 14  Temp: 37 C   SpO2: 100% 100%    Last Pain:  Vitals:   06/16/17 0904  TempSrc: Tympanic  PainSc: 0-No pain         Complications: No apparent anesthesia complications

## 2017-06-16 NOTE — Anesthesia Postprocedure Evaluation (Signed)
Anesthesia Post Note  Patient: Lindsay Cooke  Procedure(s) Performed: COLONOSCOPY WITH PROPOFOL (N/A )  Patient location during evaluation: PACU Anesthesia Type: General Level of consciousness: awake and alert Pain management: pain level controlled Vital Signs Assessment: post-procedure vital signs reviewed and stable Respiratory status: spontaneous breathing, nonlabored ventilation, respiratory function stable and patient connected to nasal cannula oxygen Cardiovascular status: blood pressure returned to baseline and stable Postop Assessment: no apparent nausea or vomiting Anesthetic complications: no     Last Vitals:  Vitals:   06/16/17 0914 06/16/17 0924  BP: 128/90 115/89  Pulse: 65 62  Resp: 16 18  Temp:    SpO2: 99% 100%    Last Pain:  Vitals:   06/16/17 0904  TempSrc: Tympanic  PainSc: 0-No pain                 Molli Barrows

## 2017-06-16 NOTE — Op Note (Addendum)
[facility] Patient Name: Lindsay Cooke Procedure Date: 06/16/2017 7:44 AM MRN: [id number] Account #: [id number] Date of Birth: 09-23-1960 Admit Type: Outpatient Age: 56 Room: [facility] ENDO ROOM 3 Gender: Female Note Status: Finalized Procedure:            Colonoscopy Indications:          Heme positive stool Providers:            Lin Landsman MD, MD Referring MD:         No Local Md, MD (Referring MD) Medicines:            Monitored Anesthesia Care Complications:        No immediate complications. Estimated blood loss: None. Procedure:            Pre-Anesthesia Assessment:                       - Prior to the procedure, a History and Physical was                        performed, and patient medications and allergies were                        reviewed. The patient is competent. The risks and                        benefits of the procedure and the sedation options and                        risks were discussed with the patient. All questions                        were answered and informed consent was obtained.                        Patient identification and proposed procedure were                        verified by the physician, the nurse, the                        anesthesiologist, the anesthetist and the technician in                        the pre-procedure area in the procedure room. Mental                        Status Examination: alert and oriented. Airway                        Examination: normal oropharyngeal airway and neck                        mobility. Respiratory Examination: clear to                        auscultation. CV Examination: normal. Prophylactic                        Antibiotics: The patient does not require prophylactic  antibiotics. Prior Anticoagulants: The patient has                        taken Lovenox (enoxaparin), last dose was 2 days prior                        to procedure.  ASA Grade Assessment: III - A patient                        with severe systemic disease. After reviewing the risks                        and benefits, the patient was deemed in satisfactory                        condition to undergo the procedure. The anesthesia plan                        was to use monitored anesthesia care (MAC). Immediately                        prior to administration of medications, the patient was                        re-assessed for adequacy to receive sedatives. The                        heart rate, respiratory rate, oxygen saturations, blood                        pressure, adequacy of pulmonary ventilation, and                        response to care were monitored throughout the                        procedure. The physical status of the patient was                        re-assessed after the procedure.                       After obtaining informed consent, the colonoscope was                        passed under direct vision. Throughout the procedure,                        the patient's blood pressure, pulse, and oxygen                        saturations were monitored continuously. The                        Colonoscope was introduced through the anus and                        advanced to the the cecum, identified by appendiceal  orifice and ileocecal valve. The colonoscopy was                        performed without difficulty. The patient tolerated the                        procedure well. The quality of the bowel preparation                        was evaluated using the BBPS [facility] Bowel Preparation                        Scale) with scores of: Right Colon = 3, Transverse                        Colon = 3 and Left Colon = 3 (entire mucosa seen well                        with no residual staining, small fragments of stool or                        opaque liquid). The total BBPS score equals 9. Findings:      Two  sessile polyps were found in the ascending colon. The polyps were       diminutive in size. These polyps were removed with a cold biopsy       forceps. Resection and retrieval were complete.      A 4 mm polyp was found in the transverse colon. The polyp was sessile.       The polyp was removed with a cold biopsy forceps. Resection and       retrieval were complete.      A 6 mm polyp was found in the sigmoid colon. The polyp was flat. The       polyp was removed with a cold snare. Resection and retrieval were       complete.      A diminutive polyp was found in the rectum. The polyp was sessile. The       polyp was removed with a cold biopsy forceps. Resection and retrieval       were complete.      The retroflexed view of the distal rectum and anal verge was normal and       showed no anal or rectal abnormalities. Impression:           - Two diminutive polyps in the ascending colon, removed                        with a cold biopsy forceps. Resected and retrieved.                       - One 4 mm polyp in the transverse colon, removed with                        a cold biopsy forceps. Resected and retrieved.                       - One 6 mm polyp in the sigmoid colon, removed with a  cold snare. Resected and retrieved.                       - One diminutive polyp in the rectum, removed with a                        cold biopsy forceps. Resected and retrieved.                       - The distal rectum and anal verge are normal on                        retroflexion view. Recommendation:       - Discharge patient to home.                       - Resume previous diet today.                       - Continue present medications.                       - Resume Lovenox (enoxaparin) at prior dose today.                        Refer to primary physician for further adjustment of                        therapy.                       - Await pathology results.                        - Repeat colonoscopy in 3 - 5 years for surveillance. Procedure Code(s):    --- Professional ---                       [phone], Colonoscopy, flexible; with removal of tumor(s),                        polyp(s), or other lesion(s) by snare technique                       [id number], 56, Colonoscopy, flexible; with biopsy, single                        or multiple Diagnosis Code(s):    --- Professional ---                       D12.2, Benign neoplasm of ascending colon                       D12.3, Benign neoplasm of transverse colon (hepatic                        flexure or splenic flexure)                       D12.5, Benign neoplasm of sigmoid colon                       K62.1, Rectal polyp  R19.5, Other fecal abnormalities CPT copyright 2016 American Medical Association. All rights reserved. The codes documented in this report are preliminary and upon coder review may  be revised to meet current compliance requirements. Dr. Ulyess Mort Lin Landsman MD, MD 06/16/2017 9:03:40 AM This report has been signed electronically. Number of Addenda: 0 Note Initiated On: 06/16/2017 7:44 AM Scope Withdrawal Time: 0 hours 20 minutes 9 seconds  Total Procedure Duration: 0 hours 25 minutes 16 seconds       [facility]

## 2017-06-16 NOTE — H&P (Signed)
Cephas Darby, MD [address]  [place]  [place], [place] [address]  Main: [phone]  Fax: [phone] Pager: [phone]  Primary Care Physician:  Danelle Berry, NP Primary Gastroenterologist:  Dr. Cephas Darby  Pre-Procedure History & Physical: HPI:  Lindsay Cooke is a 56 y.o. female is here for an colonoscopy.   Past Medical History:  Diagnosis Date  . Allergy   . Anemia   . Asthma   . Benign essential hypertension   . Hyperlipidemia   . Lumbar spondylosis   . Obesity   . Reflux   . Right leg DVT [facility])     Past Surgical History:  Procedure Laterality Date  . CHOLECYSTECTOMY    . HEEL SPUR EXCISION    . HERNIA REPAIR    . KELOID EXCISION      Prior to Admission medications   Medication Sig Start Date End Date Taking? Authorizing Provider  albuterol (PROVENTIL HFA;VENTOLIN HFA) 108 (90 Base) MCG/ACT inhaler Inhale 2 puffs into the lungs every 6 (six) hours as needed for wheezing or shortness of breath. 06/30/16  Yes Orbie Pyo, MD  atorvastatin (LIPITOR) 20 MG tablet  04/11/17  Yes [provider]  cetirizine (ZYRTEC) 10 MG tablet Take 10 mg by mouth daily.   Yes [provider]  Cholecalciferol (D3-1000 PO) Take 1,000 Units by mouth daily.    Yes [provider]  Cinnamon 500 MG capsule Take 500 mg by mouth daily.    Yes [provider]  enoxaparin (LOVENOX) 80 MG/0.8ML injection Inject 80 mg into the skin daily. 02/22/17  Yes [provider]  ferrous sulfate 325 (65 FE) MG EC tablet Take 325 mg by mouth daily.   Yes [provider]  Fluocinolone Acetonide Body 0.01 % OIL At bedtime :Wet scalp.  Apply oil after shaking bottle.Cover with cap.  Wash out in am. 05/04/17  Yes [provider]  hydrALAZINE (APRESOLINE) 25 MG tablet Take 25 mg by mouth daily. 04/25/15  Yes [provider]  hydrOXYzine (ATARAX/VISTARIL) 25 MG tablet  05/06/17  Yes [provider]    levocetirizine (XYZAL) 5 MG tablet Take 1 tablet by mouth daily. 02/25/17  Yes [provider]  losartan-hydrochlorothiazide (HYZAAR) 100-25 MG tablet Take 1 tablet by mouth daily. 04/25/15  Yes [provider]  montelukast (SINGULAIR) 10 MG tablet Take by mouth.   Yes [provider]  [facility] powder  04/05/17  Yes [provider]  Omega-3 Fatty Acids (FISH OIL) 1000 MG CAPS Take 1 capsule by mouth daily.   Yes [provider]  pantoprazole (PROTONIX) 20 MG tablet Take by mouth.   Yes [provider]  sertraline (ZOLOFT) 25 MG tablet  03/10/17  Yes [provider]  vitamin E (VITAMIN E) 400 UNIT capsule Take 400 Units by mouth daily.   Yes [provider]  metoprolol tartrate (LOPRESSOR) 100 MG tablet  03/17/17   [provider]    Allergies as of 05/27/2017 - Review Complete 05/27/2017  Allergen Reaction Noted  . Penicillins Hives and Rash 02/22/2015    Family History  Problem Relation Age of Onset  . Breast cancer Sister   . Heart disease Mother   . Congestive Heart Failure Sister     Social History   Social History  . Marital status: Single    Spouse name: N/A  . Number of children: N/A  . Years of education: N/A   Occupational History  . Not  on file.   Social History Main Topics  . Smoking status: Never Smoker  . Smokeless tobacco: Never Used  . Alcohol use No  . Drug use: No  . Sexual activity: No   Other Topics Concern  . Not on file   Social History Narrative  . No narrative on file    Review of Systems: See HPI, otherwise negative ROS  Physical Exam: BP 123/82   Pulse 82   Temp 98.6 F (37 C) (Tympanic)   Resp 18   Ht 5\' 1"  (1.549 m)   Wt 283 lb (128.4 kg)   SpO2 100%   BMI 53.47 kg/m  General:   Alert,  pleasant and cooperative in NAD Head:  Normocephalic and atraumatic. Neck:  Supple; no masses or thyromegaly. Lungs:  Clear throughout to auscultation.    Heart:   Regular rate and rhythm. Abdomen:  Soft, nontender and nondistended. Normal bowel sounds, without guarding, and without rebound.   Neurologic:  Alert and  oriented x4;  grossly normal neurologically.  Impression/Plan: Lindsay Cooke is here for an colonoscopy to be performed for fecal occult positive  Risks, benefits, limitations, and alternatives regarding  colonoscopy have been reviewed with the patient.  Questions have been answered.  All parties agreeable.   Sherri Sear, MD  06/16/2017, 8:23 AM

## 2017-06-16 NOTE — Anesthesia Procedure Notes (Signed)
Date/Time: 06/16/2017 8:27 AM Performed by: Darlyne Russian Pre-anesthesia Checklist: Patient identified, Emergency Drugs available, Suction available, Patient being monitored and Timeout performed Patient Re-evaluated:Patient Re-evaluated prior to induction Oxygen Delivery Method: Nasal cannula Placement Confirmation: positive ETCO2

## 2017-06-17 ENCOUNTER — Encounter: Payer: Self-pay | Admitting: Gastroenterology

## 2017-06-17 LAB — SURGICAL PATHOLOGY

## 2017-06-20 ENCOUNTER — Ambulatory Visit: Payer: Self-pay | Admitting: Licensed Clinical Social Worker

## 2017-07-11 ENCOUNTER — Inpatient Hospital Stay (HOSPITAL_BASED_OUTPATIENT_CLINIC_OR_DEPARTMENT_OTHER): Payer: Medicare Other | Admitting: Internal Medicine

## 2017-07-11 ENCOUNTER — Inpatient Hospital Stay: Payer: Medicare Other | Attending: Internal Medicine

## 2017-07-11 ENCOUNTER — Encounter: Payer: Self-pay | Admitting: Internal Medicine

## 2017-07-11 VITALS — BP 122/78 | HR 61 | Temp 97.2°F | Resp 16 | Wt 284.0 lb

## 2017-07-11 DIAGNOSIS — Z7901 Long term (current) use of anticoagulants: Secondary | ICD-10-CM | POA: Insufficient documentation

## 2017-07-11 DIAGNOSIS — K219 Gastro-esophageal reflux disease without esophagitis: Secondary | ICD-10-CM | POA: Diagnosis not present

## 2017-07-11 DIAGNOSIS — I82401 Acute embolism and thrombosis of unspecified deep veins of right lower extremity: Secondary | ICD-10-CM

## 2017-07-11 DIAGNOSIS — I10 Essential (primary) hypertension: Secondary | ICD-10-CM

## 2017-07-11 DIAGNOSIS — I824Y1 Acute embolism and thrombosis of unspecified deep veins of right proximal lower extremity: Secondary | ICD-10-CM

## 2017-07-11 DIAGNOSIS — E669 Obesity, unspecified: Secondary | ICD-10-CM

## 2017-07-11 DIAGNOSIS — M479 Spondylosis, unspecified: Secondary | ICD-10-CM | POA: Insufficient documentation

## 2017-07-11 DIAGNOSIS — Z86718 Personal history of other venous thrombosis and embolism: Secondary | ICD-10-CM | POA: Insufficient documentation

## 2017-07-11 DIAGNOSIS — J45909 Unspecified asthma, uncomplicated: Secondary | ICD-10-CM

## 2017-07-11 DIAGNOSIS — E785 Hyperlipidemia, unspecified: Secondary | ICD-10-CM

## 2017-07-11 DIAGNOSIS — Z79899 Other long term (current) drug therapy: Secondary | ICD-10-CM | POA: Diagnosis not present

## 2017-07-11 LAB — BASIC METABOLIC PANEL
Anion gap: 10 (ref 5–15)
BUN: 15 mg/dL (ref 6–20)
CO2: 26 mmol/L (ref 22–32)
CREATININE: 0.93 mg/dL (ref 0.44–1.00)
Calcium: 9.4 mg/dL (ref 8.9–10.3)
Chloride: 105 mmol/L (ref 101–111)
GFR calc Af Amer: >60 mL/min (ref >60)
GFR calc non Af Amer: >60 mL/min (ref >60)
GLUCOSE: 95 mg/dL (ref 65–99)
Potassium: 3.7 mmol/L (ref 3.5–5.1)
Sodium: 141 mmol/L (ref 135–145)

## 2017-07-11 LAB — CBC WITH DIFFERENTIAL/PLATELET
BASOS PCT: 1 %
Basophils Absolute: 0.1 10*3/uL (ref 0–0.1)
EOS ABS: 0.2 10*3/uL (ref 0–0.7)
EOS PCT: 5 %
HCT: 34.5 % — ABNORMAL LOW (ref 35.0–47.0)
Hemoglobin: 11.8 g/dL — ABNORMAL LOW (ref 12.0–16.0)
Lymphocytes Relative: 50 %
Lymphs Abs: 2 10*3/uL (ref 1.0–3.6)
MCH: 29.2 pg (ref 26.0–34.0)
MCHC: 34.3 g/dL (ref 32.0–36.0)
MCV: 85.1 fL (ref 80.0–100.0)
MONO ABS: 0.4 10*3/uL (ref 0.2–0.9)
MONOS PCT: 9 %
NEUTROS PCT: 35 %
Neutro Abs: 1.4 10*3/uL (ref 1.4–6.5)
PLATELETS: 190 10*3/uL (ref 150–440)
RBC: 4.06 MIL/uL (ref 3.80–5.20)
RDW: 15.7 % — AB (ref 11.5–14.5)
WBC: 3.9 10*3/uL (ref 3.6–11.0)

## 2017-07-11 NOTE — Assessment & Plan Note (Addendum)
#   Right lower extremity DVT 2; and most recently September 2016-started on Eliquis; however in March 2017 switch over to Coumadin given the stroke by neurology. More recently, switch to Lovenox- by PCP given the blood per rectum/poorly controlled INR.   #  Continue Lovenox as per PCP; Defer to PCP for refills.  # In general I would have recommended anticoagulation for DVT for 6 months; however patient is recommended indefinite anticoagulation with Coumadin from her stroke perspective. I would defer to PCP/neurology re: anti-coagulation perspective.   # Encouraged the patient to lose weight; she seems to motivated.  # Follow-up in 6 months/cbc-bmp.

## 2017-07-11 NOTE — Progress Notes (Signed)
[place] NOTE  Patient Care Team: Danelle Berry, NP as PCP - General (Nurse Practitioner) Dionisio David, MD as Consulting Physician (Cardiology)  CHIEF COMPLAINTS/PURPOSE OF CONSULTATION:  DVT recurrent  HEMATOLOGIC HISTORY:  # ? MARCH 2016- DVT on Eliquis ; SEP 2016-DVT [RIGHT sup Fem vein] Prothrombin gene mutation; factor V Leiden negative. Anticardiolipin antibody negative; On Eliquis; March 2017- Stopped Eliquis [given stroke]; Started Coumadin [difficulty in controlling the INR; blood in stools]; currently Lovenox 80 mg a day.   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 morbidly obese female with history of right lower extremity DVT 2; and also history of stroke in March 2017- is currently on Lovenox is here for follow-up.  Patient has not had any repeated DVTs or strokes or pills. She recently underwent a colonoscopy that was uneventful. She still concerned about her ongoing arthritis/weight; interested in bariatric surgery.   ROS: No chest pain or shortness of breath or cough. A complete 10 point review of system is done which is negative except mentioned above in history of present illness.   MEDICAL HISTORY:  Past Medical History:  Diagnosis Date  . Allergy   . Anemia   . Asthma   . Benign essential hypertension   . Hyperlipidemia   . Lumbar spondylosis   . Obesity   . Reflux   . Right leg DVT ([place])     SURGICAL HISTORY: Past Surgical History:  Procedure Laterality Date  . CHOLECYSTECTOMY    . HEEL SPUR EXCISION    . HERNIA REPAIR    . KELOID EXCISION      SOCIAL HISTORY: Social History   Socioeconomic History  . Marital status: Single    Spouse name: Not on file  . Number of children: Not on file  . Years of education: Not on file  . Highest education  level: Not on file  Social Needs  . Financial resource strain: Not on file  . Food insecurity - worry: Not on file  . Food insecurity - inability: Not on file  . Transportation needs - medical: Not on file  . Transportation needs - non-medical: Not on file  Occupational History  . Not on file  Tobacco Use  . Smoking status: Never Smoker  . Smokeless tobacco: Never Used  Substance and Sexual Activity  . Alcohol use: No    Alcohol/week: 0.0 oz  . Drug use: No  . Sexual activity: No  Other Topics Concern  . Not on file  Social History Narrative  . Not on file    FAMILY HISTORY: Family History  Problem Relation Age of Onset  . Breast cancer Sister   . Heart disease Mother   . Congestive Heart Failure Sister     ALLERGIES:  is allergic to penicillins.  MEDICATIONS:  Current Outpatient Medications  Medication Sig Dispense Refill  . albuterol (PROVENTIL HFA;VENTOLIN HFA) 108 (90 Base) MCG/ACT inhaler Inhale 2 puffs into the lungs every 6 (six) hours as needed for wheezing or shortness of breath. 1 Inhaler 0  . atorvastatin (LIPITOR) 20 MG tablet     . cetirizine (ZYRTEC) 10 MG tablet Take 10 mg by mouth daily.    . Cholecalciferol (D3-1000 PO) Take 1,000 Units by mouth daily.     . Cinnamon 500 MG capsule Take 500 mg by mouth  daily.     . enoxaparin (LOVENOX) 80 MG/0.8ML injection Inject 80 mg into the skin daily.    . ferrous sulfate 325 (65 FE) MG EC tablet Take 325 mg by mouth daily.    . Fluocinolone Acetonide Body 0.01 % OIL At bedtime :Wet scalp.  Apply oil after shaking bottle.Cover with cap.  Wash out in am.    . hydrALAZINE (APRESOLINE) 25 MG tablet Take 25 mg by mouth daily.    . hydrOXYzine (ATARAX/VISTARIL) 25 MG tablet     . levocetirizine (XYZAL) 5 MG tablet Take 1 tablet by mouth daily.    Marland Kitchen losartan-hydrochlorothiazide (HYZAAR) 100-25 MG tablet Take 1 tablet by mouth daily.    . metoprolol tartrate (LOPRESSOR) 100 MG tablet     . montelukast (SINGULAIR) 10 MG  tablet Take by mouth.    Marland Kitchen NYAMYC powder     . Omega-3 Fatty Acids (FISH OIL) 1000 MG CAPS Take 1 capsule by mouth daily.    . pantoprazole (PROTONIX) 20 MG tablet Take by mouth.    . sertraline (ZOLOFT) 25 MG tablet     . vitamin E (VITAMIN E) 400 UNIT capsule Take 400 Units by mouth daily.     No current facility-administered medications for this visit.       Marland Kitchen  PHYSICAL EXAMINATION: ECOG PERFORMANCE STATUS: 1 - Symptomatic but completely ambulatory  Vitals:   07/11/17 1049  BP: 122/78  Pulse: 61  Resp: 16  Temp: (!) 97.2 F (36.2 C)   Filed Weights   07/11/17 1049  Weight: 284 lb (128.8 kg)    GENERAL: Well-nourished well-developed; Alert, no distress; mild discomfort because of knee pain. Morbidly obese; has difficulty getting onto the table because of habitus. EYES: no pallor or icterus OROPHARYNX: no thrush or ulceration; good dentition  NECK: supple, no masses felt LYMPH: no palpable lymphadenopathy in the cervical, axillary or inguinal regions LUNGS: clear to auscultation and No wheeze or crackles HEART/CVS: regular rate & rhythm and no murmurs; Right thigh- seems to be swollen compared to the left. No tenderness noted.  ABDOMEN: abdomen soft, non-tender and normal bowel sounds Musculoskeletal:no cyanosis of digits and no clubbing  PSYCH: alert & oriented x 3 with fluent speech NEURO: no focal motor/sensory deficits SKIN: no rashes; keloid noted on the right side of the face.  LABORATORY DATA:  I have reviewed the data as listed Lab Results  Component Value Date   WBC 3.9 07/11/2017   HGB 11.8 (L) 07/11/2017   HCT 34.5 (L) 07/11/2017   MCV 85.1 07/11/2017   PLT 190 07/11/2017   Recent Labs    09/10/16 1526 12/01/16 1600 05/30/17 1150 07/11/17 1035  NA 139 141 145* 141  K 3.6 3.8 3.9 3.7  CL 104 103 104 105  CO2 32 30  --  26  GLUCOSE 85 146* 96 95  BUN 14 21* 18 15  CREATININE 0.87 0.96 0.95 0.93  CALCIUM 9.5 9.5 10.0 9.4  GFRNONAA >60  >60 69 >60  GFRAA >60 >60 79 >60  PROT  --   --  6.7  --   ALBUMIN  --   --  4.5  --   AST  --   --  19  --   ALKPHOS  --   --  66  --   BILITOT  --   --  0.5  --     RADIOGRAPHIC STUDIES: I have personally reviewed the radiological images as listed and agreed with the  findings in the report. No results found.  ASSESSMENT & PLAN:   Acute deep vein thrombosis (DVT) of proximal vein of right lower extremity ([place]) # Right lower extremity DVT 2; and most recently September 2016-started on Eliquis; however in March 2017 switch over to Coumadin given the stroke by neurology. More recently, switch to Lovenox- by PCP given the blood per rectum.   #  Continue Lovenox as per PCP;Defer .. awaiting GI evaluation in end of July 2018.   # In general I would have recommended anticoagulation for DVT for 6 months; however patient is recommended indefinite anticoagulation with Coumadin from her stroke perspective. I would defer to PCP/neurology re: anti-coagulation perspective.   # Encouraged the patient to lose weight; she seems to motivated.   # Follow-up in 6 months/cbc-bmp.     Cammie Sickle, MD 07/11/2017 1:35 PM

## 2017-07-25 ENCOUNTER — Telehealth: Payer: Self-pay | Admitting: *Deleted

## 2017-07-25 NOTE — Telephone Encounter (Signed)
Patient called requesting to go back on the pill and off the injections. "I am tired of the injections" Please advise

## 2017-07-26 ENCOUNTER — Telehealth: Payer: Self-pay | Admitting: Internal Medicine

## 2017-07-26 NOTE — Telephone Encounter (Signed)
Should this call be deferred to her PCP?  ASSESSMENT & PLAN:   Acute deep vein thrombosis (DVT) of proximal vein of right lower extremity ([place]) # Right lower extremity DVT 2; and most recently September 2016-started on Eliquis; however in March 2017 switch over to Coumadin given the stroke by neurology. More recently, switch to Lovenox- by PCP given the blood per rectum.   #  Continue Lovenox as per PCP;Defer .. awaiting GI evaluation in end of July 2018.   # In general I would have recommended anticoagulation for DVT for 6 months; however patient is recommended indefinite anticoagulation with Coumadin from her stroke perspective. I would defer to PCP/neurology re: anti-coagulation perspective.   # Encouraged the patient to lose weight; she seems to motivated.   # Follow-up in 6 months/cbc-bmp.     Cammie Sickle, MD 07/11/2017 1:35 PM

## 2017-07-26 NOTE — Telephone Encounter (Signed)
Patient informed that Dr B has reached out to her PCP and is awaiting a return call to discuss changing of therapy from injection to oral and asked that she ask that they call him if she speaks to them as well

## 2017-07-26 NOTE — Telephone Encounter (Signed)
Spoke to patient's PCP.  Recommend neurology evaluation for anticoagulation for the stroke.  Recommend patient follow-up after the neurology evaluation-to review the recommendations for history of DVT.    Please inform patient-no changes to her shots at this time until the neurology evaluation.  PCPs office will contact patient regarding neurology evaluation.

## 2017-07-26 NOTE — Telephone Encounter (Signed)
I left a message for patient's PCP to discuss the anticoagulation management.   #Lindsay Cooke -please inform patient that I have reached out to patient's PCP regarding her blood thinners [pills versus shots].  Ask the patient to have her PCP reach out to me to discuss the options for her.   GB

## 2017-07-27 NOTE — Telephone Encounter (Signed)
See other note

## 2017-07-27 NOTE — Telephone Encounter (Signed)
Patient informed of doctor response and states she has already spoken with PCP this morning

## 2017-08-24 DIAGNOSIS — I825Z1 Chronic embolism and thrombosis of unspecified deep veins of right distal lower extremity: Secondary | ICD-10-CM | POA: Insufficient documentation

## 2017-09-20 ENCOUNTER — Ambulatory Visit: Admit: 2017-09-20 | Payer: Medicare Other | Admitting: Gastroenterology

## 2017-09-20 SURGERY — COLONOSCOPY WITH PROPOFOL
Anesthesia: General

## 2017-09-21 IMAGING — CR DG CHEST 2V
2 series · 2 of 2 positions shown · non-contrast
Comparison: 02/22/2015

CLINICAL DATA: Chest pain for 1 week and cough for 2 days.  Asthma.

EXAM:
CHEST  2 VIEW

[chest pa]
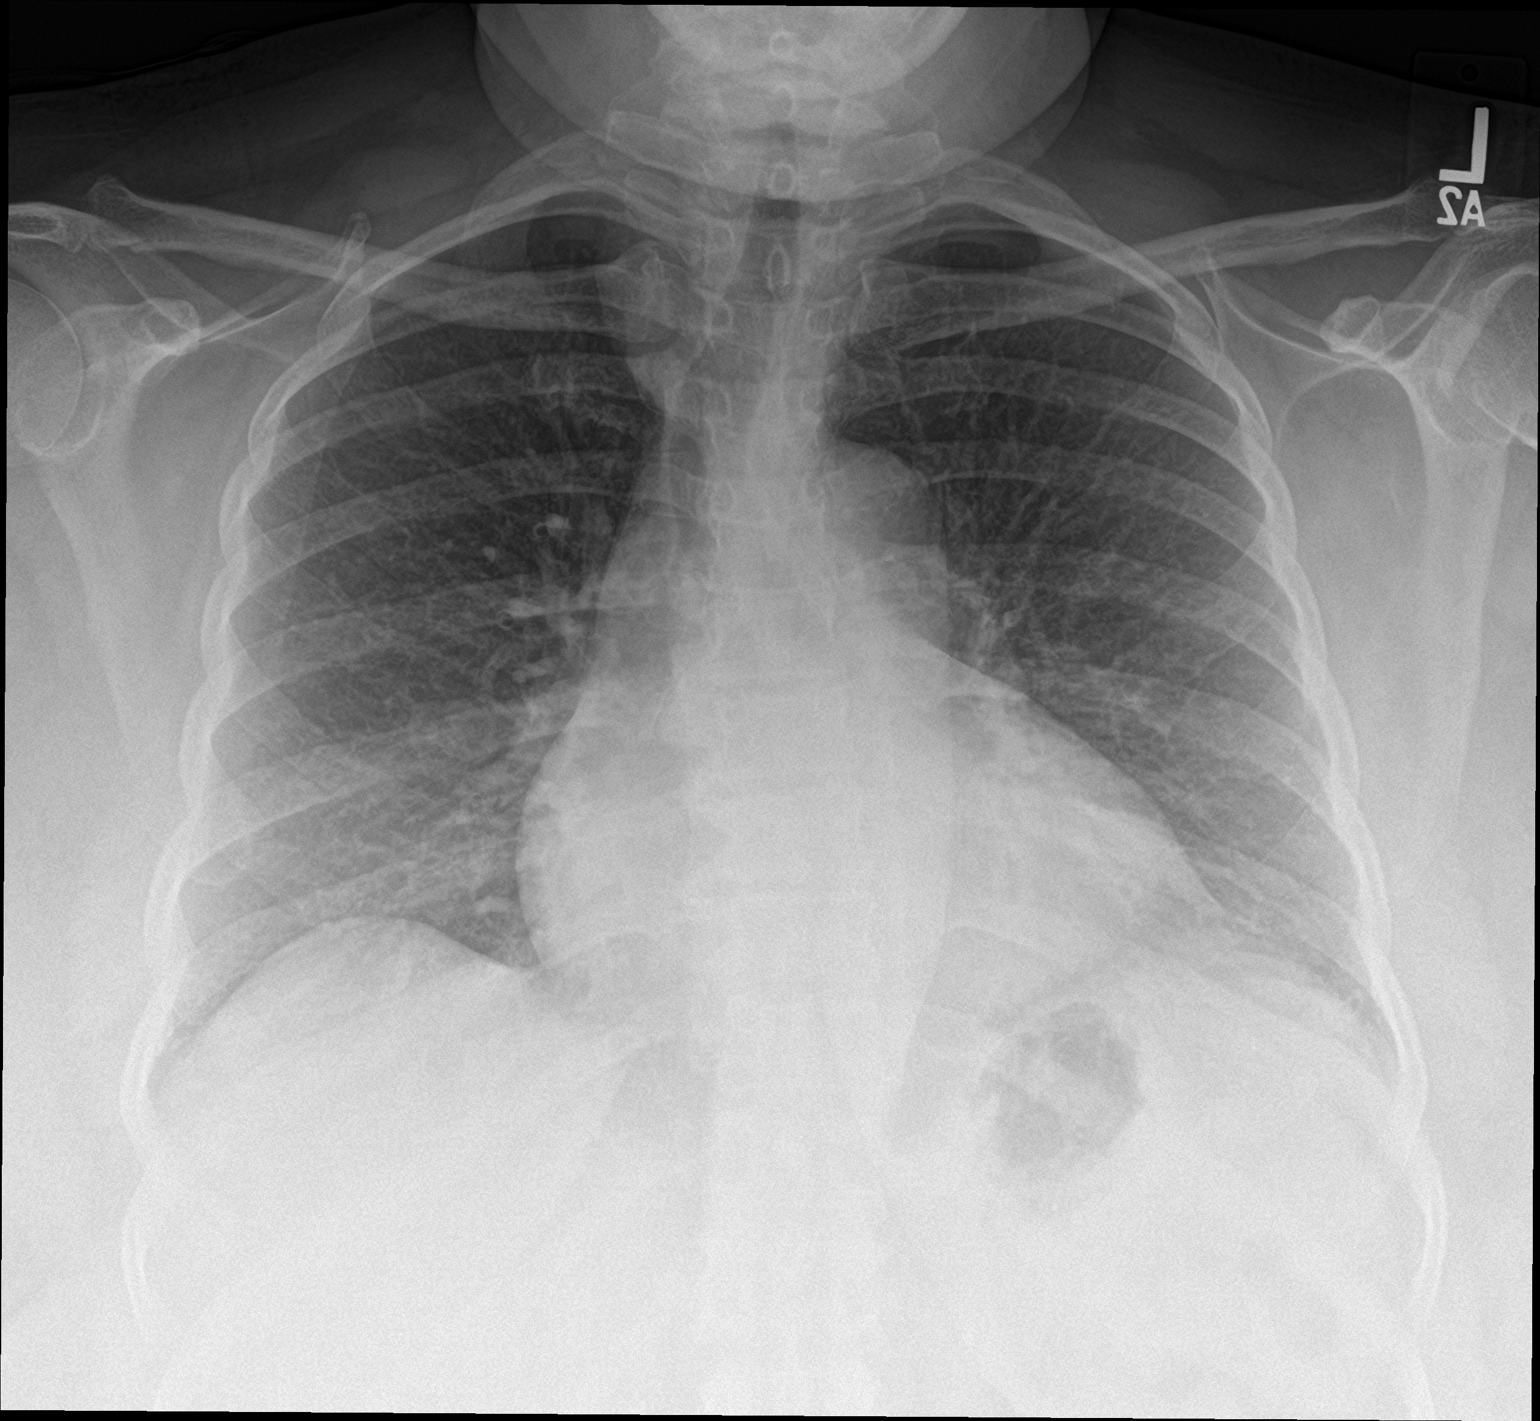

[chest lat]
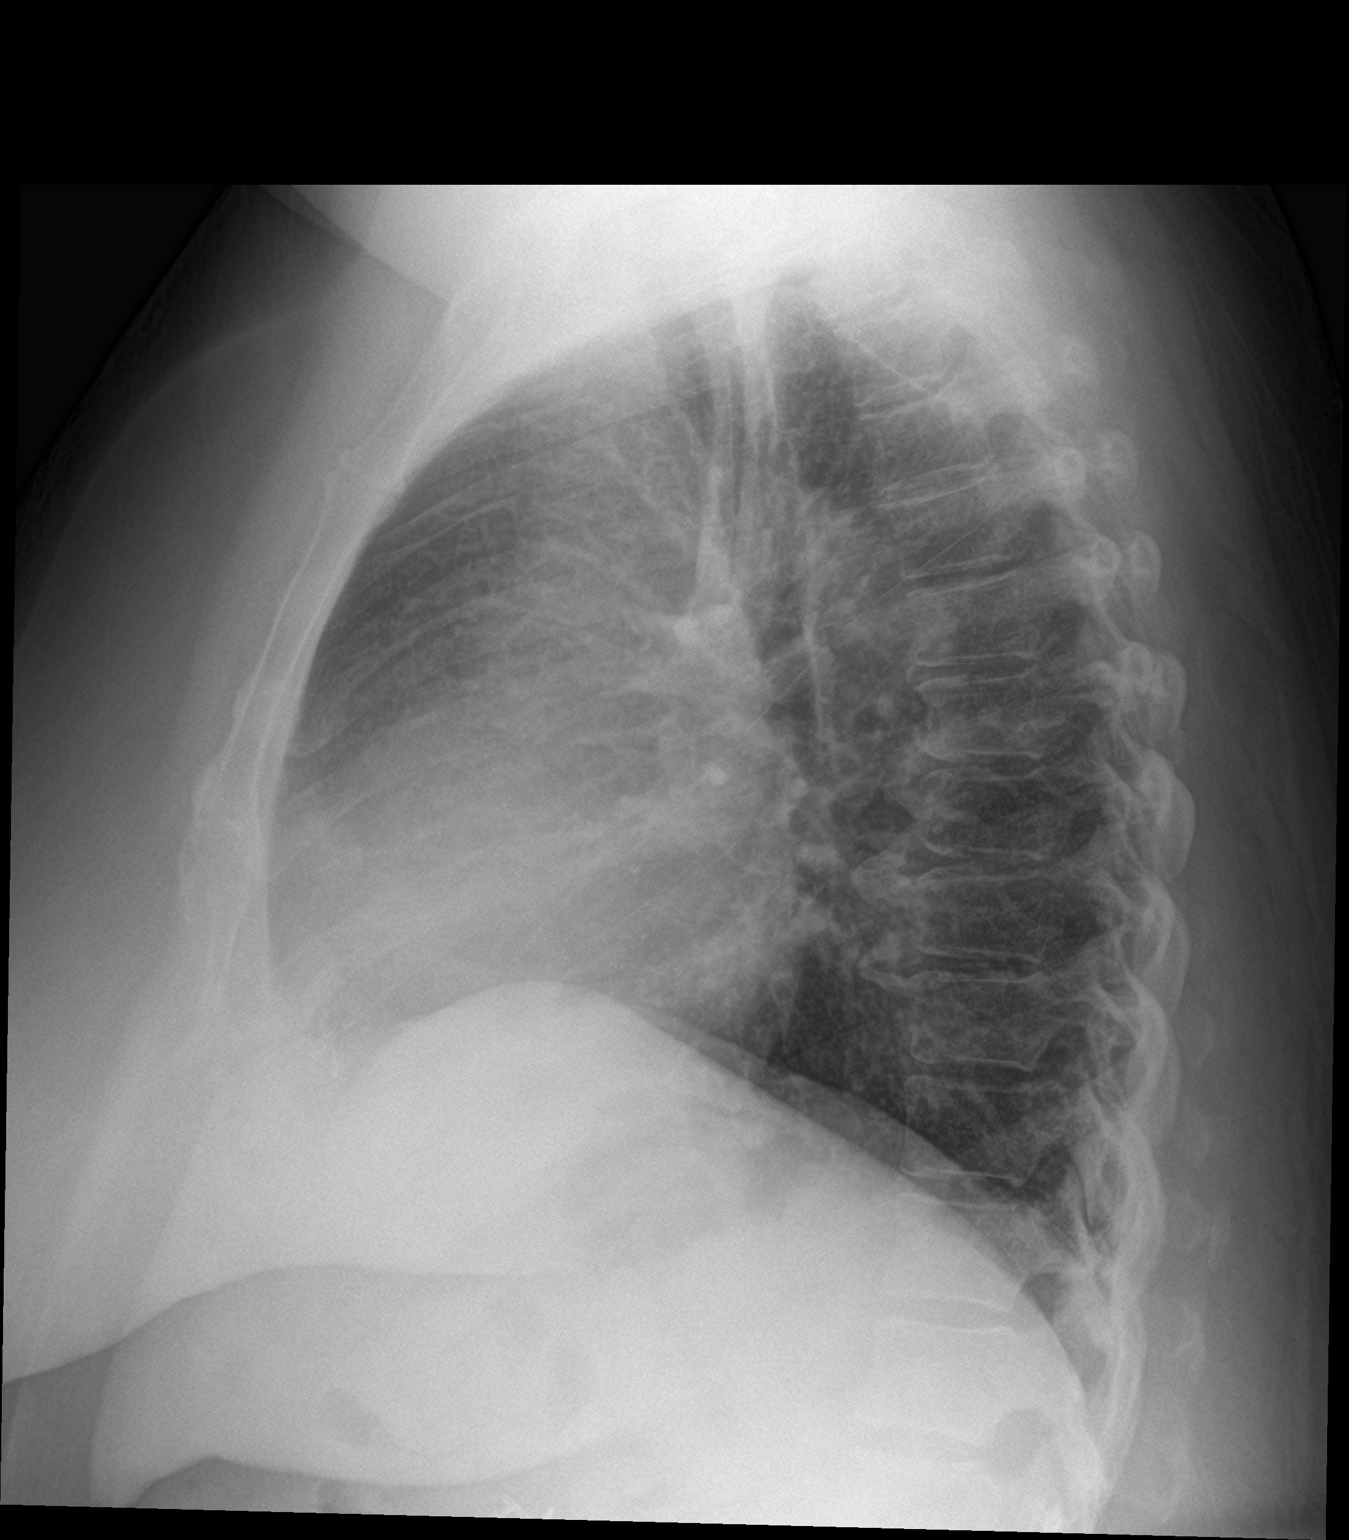

[2 of 2 positions shown; findings below may reference images not displayed]

FINDINGS: Thoracic spondylosis. Mild to moderate enlargement of the
cardiopericardial silhouette, without edema. No pleural effusion. No
airspace opacity identified. No overt airway thickening.
IMPRESSION: 1. Mild-to-moderate enlargement of the cardiopericardial silhouette,
without edema.
2. Thoracic spondylosis.

## 2017-12-05 ENCOUNTER — Other Ambulatory Visit: Payer: Self-pay | Admitting: Acute Care

## 2017-12-05 ENCOUNTER — Ambulatory Visit: Payer: Medicare Other

## 2017-12-05 DIAGNOSIS — G459 Transient cerebral ischemic attack, unspecified: Secondary | ICD-10-CM | POA: Insufficient documentation

## 2017-12-05 DIAGNOSIS — I82409 Acute embolism and thrombosis of unspecified deep veins of unspecified lower extremity: Secondary | ICD-10-CM

## 2017-12-08 ENCOUNTER — Other Ambulatory Visit: Payer: Self-pay | Admitting: Acute Care

## 2017-12-08 DIAGNOSIS — I82409 Acute embolism and thrombosis of unspecified deep veins of unspecified lower extremity: Secondary | ICD-10-CM

## 2017-12-08 DIAGNOSIS — M79604 Pain in right leg: Secondary | ICD-10-CM

## 2017-12-09 ENCOUNTER — Ambulatory Visit
Admission: RE | Admit: 2017-12-09 | Discharge: 2017-12-09 | Disposition: A | Discharge status: Home / Self Care | Payer: Medicare Other | Source: Ambulatory Visit | Attending: Acute Care | Admitting: Acute Care

## 2017-12-09 DIAGNOSIS — M79604 Pain in right leg: Secondary | ICD-10-CM | POA: Insufficient documentation

## 2017-12-09 DIAGNOSIS — M7121 Synovial cyst of popliteal space [Baker], right knee: Secondary | ICD-10-CM | POA: Insufficient documentation

## 2017-12-09 DIAGNOSIS — I82409 Acute embolism and thrombosis of unspecified deep veins of unspecified lower extremity: Secondary | ICD-10-CM

## 2018-01-09 ENCOUNTER — Inpatient Hospital Stay: Payer: Medicare Other

## 2018-01-09 ENCOUNTER — Inpatient Hospital Stay: Payer: Medicare Other | Admitting: Internal Medicine

## 2018-04-20 ENCOUNTER — Other Ambulatory Visit: Payer: Self-pay | Admitting: Nurse Practitioner

## 2018-04-20 DIAGNOSIS — Z1231 Encounter for screening mammogram for malignant neoplasm of breast: Secondary | ICD-10-CM

## 2018-05-12 ENCOUNTER — Other Ambulatory Visit: Payer: Self-pay | Admitting: Orthopedic Surgery

## 2018-05-12 DIAGNOSIS — M7122 Synovial cyst of popliteal space [Baker], left knee: Secondary | ICD-10-CM

## 2018-05-17 ENCOUNTER — Other Ambulatory Visit: Payer: Self-pay | Admitting: Radiology

## 2018-05-18 ENCOUNTER — Ambulatory Visit: Admission: RE | Admit: 2018-05-18 | Payer: Medicare Other | Source: Ambulatory Visit

## 2018-05-18 ENCOUNTER — Ambulatory Visit
Admission: RE | Admit: 2018-05-18 | Discharge: 2018-05-18 | Disposition: A | Discharge status: Home / Self Care | Payer: Medicare Other | Source: Ambulatory Visit | Attending: Orthopedic Surgery | Admitting: Orthopedic Surgery

## 2018-05-18 ENCOUNTER — Ambulatory Visit: Payer: Medicare Other

## 2018-05-18 DIAGNOSIS — M7122 Synovial cyst of popliteal space [Baker], left knee: Secondary | ICD-10-CM | POA: Insufficient documentation

## 2018-05-18 LAB — PROTIME-INR
INR: 0.88
Prothrombin Time: 11.9 seconds (ref 11.4–15.2)

## 2018-05-18 MED ORDER — METHYLPREDNISOLONE ACETATE 80 MG/ML IJ SUSP
INTRAMUSCULAR | Status: AC
  Filled 2018-05-18: qty 1

## 2018-05-18 MED ORDER — METHYLPREDNISOLONE ACETATE 40 MG/ML IJ SUSP
INTRAMUSCULAR | Status: DC | PRN
  Administered 2018-05-18: 40 mg via INTRALESIONAL

## 2018-05-18 MED ORDER — BUPIVACAINE HCL (PF) 0.25 % IJ SOLN
INTRAMUSCULAR | Status: DC | PRN
  Administered 2018-05-18: 1 mL

## 2018-05-18 MED ORDER — BUPIVACAINE HCL (PF) 0.25 % IJ SOLN
INTRAMUSCULAR | Status: AC
  Filled 2018-05-18: qty 30

## 2018-06-05 ENCOUNTER — Ambulatory Visit
Admission: RE | Admit: 2018-06-05 | Discharge: 2018-06-05 | Disposition: A | Discharge status: Home / Self Care | Payer: Medicare Other | Source: Ambulatory Visit | Attending: Nurse Practitioner | Admitting: Nurse Practitioner

## 2018-06-05 DIAGNOSIS — Z1231 Encounter for screening mammogram for malignant neoplasm of breast: Secondary | ICD-10-CM | POA: Insufficient documentation

## 2018-07-27 ENCOUNTER — Telehealth: Payer: Self-pay | Admitting: Internal Medicine

## 2018-07-27 DIAGNOSIS — Z86718 Personal history of other venous thrombosis and embolism: Secondary | ICD-10-CM

## 2018-07-27 NOTE — Addendum Note (Signed)
Addended by: Sabino Gasser on: 07/27/2018 08:22 AM   Modules accepted: Orders

## 2018-07-27 NOTE — Telephone Encounter (Signed)
As requested by PCP- Please schedule appt with me in 1-2 weeks.  Please order cbc/cmp. Thx GB

## 2018-08-16 ENCOUNTER — Inpatient Hospital Stay: Payer: Medicare Other | Attending: Internal Medicine

## 2018-08-16 ENCOUNTER — Inpatient Hospital Stay (HOSPITAL_BASED_OUTPATIENT_CLINIC_OR_DEPARTMENT_OTHER): Payer: Medicare Other | Admitting: Internal Medicine

## 2018-08-16 VITALS — BP 120/80 | HR 71 | Resp 16 | Wt 301.6 lb

## 2018-08-16 DIAGNOSIS — I824Y1 Acute embolism and thrombosis of unspecified deep veins of right proximal lower extremity: Secondary | ICD-10-CM | POA: Diagnosis present

## 2018-08-16 DIAGNOSIS — Z8673 Personal history of transient ischemic attack (TIA), and cerebral infarction without residual deficits: Secondary | ICD-10-CM

## 2018-08-16 DIAGNOSIS — Z86718 Personal history of other venous thrombosis and embolism: Secondary | ICD-10-CM

## 2018-08-16 DIAGNOSIS — Z7901 Long term (current) use of anticoagulants: Secondary | ICD-10-CM

## 2018-08-16 LAB — COMPREHENSIVE METABOLIC PANEL
ALBUMIN: 4.3 g/dL (ref 3.5–5.0)
ALK PHOS: 52 U/L (ref 38–126)
ALT: 21 U/L (ref 0–44)
AST: 20 U/L (ref 15–41)
Anion gap: 11 (ref 5–15)
BILIRUBIN TOTAL: 0.8 mg/dL (ref 0.3–1.2)
BUN: 14 mg/dL (ref 6–20)
CALCIUM: 9.6 mg/dL (ref 8.9–10.3)
CO2: 30 mmol/L (ref 22–32)
Chloride: 103 mmol/L (ref 98–111)
Creatinine, Ser: 0.89 mg/dL (ref 0.44–1.00)
GFR calc Af Amer: >60 mL/min (ref >60)
GFR calc non Af Amer: >60 mL/min (ref >60)
Glucose, Bld: 99 mg/dL (ref 70–99)
Potassium: 3.9 mmol/L (ref 3.5–5.1)
Sodium: 144 mmol/L (ref 135–145)
TOTAL PROTEIN: 6.9 g/dL (ref 6.5–8.1)

## 2018-08-16 LAB — CBC WITH DIFFERENTIAL/PLATELET
Abs Immature Granulocytes: 0.02 10*3/uL (ref 0.00–0.07)
BASOS ABS: 0 10*3/uL (ref 0.0–0.1)
BASOS PCT: 1 %
EOS PCT: 4 %
Eosinophils Absolute: 0.2 10*3/uL (ref 0.0–0.5)
HCT: 36.2 % (ref 36.0–46.0)
HEMOGLOBIN: 12.1 g/dL (ref 12.0–15.0)
Immature Granulocytes: 0 %
LYMPHS ABS: 2.3 10*3/uL (ref 0.7–4.0)
Lymphocytes Relative: 46 %
MCH: 28.7 pg (ref 26.0–34.0)
MCHC: 33.4 g/dL (ref 30.0–36.0)
MCV: 86 fL (ref 80.0–100.0)
Monocytes Absolute: 0.4 10*3/uL (ref 0.1–1.0)
Monocytes Relative: 8 %
Neutro Abs: 2 10*3/uL (ref 1.7–7.7)
Neutrophils Relative %: 41 %
PLATELETS: 221 10*3/uL (ref 150–400)
RBC: 4.21 MIL/uL (ref 3.87–5.11)
RDW: 15.1 % (ref 11.5–15.5)
WBC: 4.9 10*3/uL (ref 4.0–10.5)
nRBC: 0 % (ref 0.0–0.2)

## 2018-08-16 NOTE — Progress Notes (Signed)
[place] NOTE  Patient Care Team: Danelle Berry, NP as PCP - General (Nurse Practitioner) Dionisio David, MD as Consulting Physician (Cardiology)  CHIEF COMPLAINTS/PURPOSE OF CONSULTATION:  DVT recurrent  HEMATOLOGIC HISTORY:  # ? MARCH 2016- DVT on Eliquis ; SEP 2016-DVT [RIGHT sup Fem vein; PCP office] Prothrombin gene mutation; factor V Leiden negative. Anticardiolipin antibody negative; On Eliquis; March 2017- Stopped Eliquis [given stroke]; Started Coumadin [difficulty in controlling the INR; blood in stools]; currently Lovenox 80 mg a day.   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 morbidly obese female with history of right lower extremity DVT 2; and also history of stroke in March 2017 [while on Eliquis]- is currently on Lovenox is here for follow-up.  Patient states that she is getting " tired taking shots".  She wants to come off the Lovenox injections.  She has been recently evaluated by neurology.  Patient has not had any repeated blood clots or strokes.   Review of Systems  Constitutional: Negative for chills, diaphoresis, fever, malaise/fatigue and weight loss.  HENT: Negative for nosebleeds and sore throat.   Eyes: Negative for double vision.  Respiratory: Negative for cough, hemoptysis, sputum production, shortness of breath and wheezing.   Cardiovascular: Negative for chest pain, palpitations, orthopnea and leg swelling.  Gastrointestinal: Negative for abdominal pain, blood in stool, constipation, diarrhea, heartburn, melena, nausea and vomiting.  Genitourinary: Negative for dysuria, frequency and urgency.  Musculoskeletal: Negative for back pain and joint pain.  Skin: Negative.  Negative for itching and rash.  Neurological: Negative for dizziness, tingling, focal  weakness, weakness and headaches.  Endo/Heme/Allergies: Does not bruise/bleed easily.  Psychiatric/Behavioral: Negative for depression. The patient is not nervous/anxious and does not have insomnia.      MEDICAL HISTORY:  Past Medical History:  Diagnosis Date  . Allergy   . Anemia   . Asthma   . Benign essential hypertension   . Hyperlipidemia   . Lumbar spondylosis   . Obesity   . Reflux   . Right leg DVT ([place])     SURGICAL HISTORY: Past Surgical History:  Procedure Laterality Date  . CHOLECYSTECTOMY    . COLONOSCOPY WITH PROPOFOL N/A 06/16/2017   Procedure: COLONOSCOPY WITH PROPOFOL;  Surgeon: Lin Landsman, MD;  Location: [facility] ENDOSCOPY;  Service: Gastroenterology;  Laterality: N/A;  . HEEL SPUR EXCISION    . HERNIA REPAIR    . KELOID EXCISION      SOCIAL HISTORY: Social History   Socioeconomic History  . Marital status: Single    Spouse name: Not on file  . Number of children: Not on file  . Years of education: Not on file  . Highest education level: Not on file  Occupational History  . Not on file  Social Needs  . Financial resource strain: Not on file  . Food insecurity:    Worry: Not on file    Inability: Not on file  . Transportation needs:    Medical: Not on file    Non-medical: Not on file  Tobacco Use  . Smoking status: Never Smoker  . Smokeless tobacco: Never Used  Substance and Sexual Activity  . Alcohol use: No    Alcohol/week: 0.0 standard drinks  . Drug use: No  . Sexual activity: Never  Lifestyle  . Physical activity:  Days per week: Not on file    Minutes per session: Not on file  . Stress: Not on file  Relationships  . Social connections:    Talks on phone: Not on file    Gets together: Not on file    Attends religious service: Not on file    Active member of club or organization: Not on file    Attends meetings of clubs or organizations: Not on file    Relationship status: Not on file  . Intimate partner violence:     Fear of current or ex partner: Not on file    Emotionally abused: Not on file    Physically abused: Not on file    Forced sexual activity: Not on file  Other Topics Concern  . Not on file  Social History Narrative  . Not on file    FAMILY HISTORY: Family History  Problem Relation Age of Onset  . Breast cancer Sister   . Heart disease Mother   . Congestive Heart Failure Sister     ALLERGIES:  is allergic to penicillins.  MEDICATIONS:  Current Outpatient Medications  Medication Sig Dispense Refill  . albuterol (PROVENTIL HFA;VENTOLIN HFA) 108 (90 Base) MCG/ACT inhaler Inhale 2 puffs into the lungs every 6 (six) hours as needed for wheezing or shortness of breath. 1 Inhaler 0  . atorvastatin (LIPITOR) 20 MG tablet     . cetirizine (ZYRTEC) 10 MG tablet Take 10 mg by mouth daily.    . Cholecalciferol (D3-1000 PO) Take 1,000 Units by mouth daily.     . Cinnamon 500 MG capsule Take 500 mg by mouth daily.     Marland Kitchen enoxaparin (LOVENOX) 80 MG/0.8ML injection Inject 80 mg into the skin daily.    . ferrous sulfate 325 (65 FE) MG EC tablet Take 325 mg by mouth daily.    . Fluocinolone Acetonide Body 0.01 % OIL At bedtime :Wet scalp.  Apply oil after shaking bottle.Cover with cap.  Wash out in am.    . hydrALAZINE (APRESOLINE) 25 MG tablet Take 25 mg by mouth daily.    . hydrOXYzine (ATARAX/VISTARIL) 25 MG tablet     . levocetirizine (XYZAL) 5 MG tablet Take 1 tablet by mouth daily.    Marland Kitchen losartan-hydrochlorothiazide (HYZAAR) 100-25 MG tablet Take 1 tablet by mouth daily.    . metoprolol tartrate (LOPRESSOR) 100 MG tablet     . montelukast (SINGULAIR) 10 MG tablet Take by mouth.    Marland Kitchen NYAMYC powder     . Omega-3 Fatty Acids (FISH OIL) 1000 MG CAPS Take 1 capsule by mouth daily.    . pantoprazole (PROTONIX) 20 MG tablet Take by mouth.    . sertraline (ZOLOFT) 25 MG tablet     . vitamin E (VITAMIN E) 400 UNIT capsule Take 400 Units by mouth daily.     No current facility-administered  medications for this visit.       Marland Kitchen  PHYSICAL EXAMINATION: ECOG PERFORMANCE STATUS: 1 - Symptomatic but completely ambulatory  Vitals:   08/16/18 1333  BP: 120/80  Pulse: 71  Resp: 16   Filed Weights   08/16/18 1333  Weight: (!) 301 lb 9.6 oz (136.8 kg)    Physical Exam  Constitutional: She is oriented to person, place, and time and well-developed, well-nourished, and in no distress.  Obese.  Alone.  HENT:  Head: Normocephalic and atraumatic.  Mouth/Throat: Oropharynx is clear and moist. No oropharyngeal exudate.  Eyes: Pupils are equal, round, and reactive to  light.  Neck: Normal range of motion. Neck supple.  Cardiovascular: Normal rate and regular rhythm.  Pulmonary/Chest: No respiratory distress. She has no wheezes.  Abdominal: Soft. Bowel sounds are normal. She exhibits no distension and no mass. There is no tenderness. There is no rebound and no guarding.  Musculoskeletal: Normal range of motion. She exhibits no edema or tenderness.  Neurological: She is alert and oriented to person, place, and time.  Skin: Skin is warm.  Psychiatric: Affect normal.     LABORATORY DATA:  I have reviewed the data as listed Lab Results  Component Value Date   WBC 4.9 08/16/2018   HGB 12.1 08/16/2018   HCT 36.2 08/16/2018   MCV 86.0 08/16/2018   PLT 221 08/16/2018   Recent Labs    08/16/18 1309  NA 144  K 3.9  CL 103  CO2 30  GLUCOSE 99  BUN 14  CREATININE 0.89  CALCIUM 9.6  GFRNONAA >60  GFRAA >60  PROT 6.9  ALBUMIN 4.3  AST 20  ALT 21  ALKPHOS 52  BILITOT 0.8    RADIOGRAPHIC STUDIES: I have personally reviewed the radiological images as listed and agreed with the findings in the report. No results found.  ASSESSMENT & PLAN:   Acute deep vein thrombosis (DVT) of proximal vein of right lower extremity ([place]) # Right lower extremity DVT 2 [PCP office; March & sep 2016]-currently on Lovenox.   #In general I would have recommended 6 months anticoagulation  for this patient-given her prior hypercoagulable work-up was negative.  I am little hesitant to put patients on indefinite anticoagulation for life with 2 episodes of DVT.  Patient's multiple ultrasound done to date have been so far negative for any evidence of acute or chronic DVT.   #History of acute stroke while on Eliquis-currently on Lovenox.  I discussed with Dr. Lynden Oxford recommends aspirin 81 mg a day and Plavix for the history of stroke.   #Given the patient's intolerance to Lovenox injections/and the fact she might not need indefinite anticoagulation-I think it is reasonable to stop Lovenox; and then start baby aspirin and Plavix. [Plavix to be prescribed by Dr. Melrose Nakayama  #However patient understand that she is high risk for DVT in future [given prior history of DVT/obesity].  If she has a recurrent acute DVT I think it be reasonable to continue indefinite anticoagulation at the time like Xarelto/Pradaxa.  DISPOSITION: # follow up in 4 months; labsd-dimer Dr.B  Cc; Dr.Khan/Boswell/ DR.Potter.  Addendum add lab encounter in 4 months    Lindsay Sickle, MD 08/16/2018 3:18 PM

## 2018-08-16 NOTE — Assessment & Plan Note (Addendum)
#   Right lower extremity DVT 2 [PCP office; March & sep 2016]-currently on Lovenox.   #In general I would have recommended 6 months anticoagulation for this patient-given her prior hypercoagulable work-up was negative.  I am little hesitant to put patients on indefinite anticoagulation for life with 2 episodes of DVT.  Patient's multiple ultrasound done to date have been so far negative for any evidence of acute or chronic DVT.   #History of acute stroke while on Eliquis-currently on Lovenox.  I discussed with Dr. Potter-who recommends aspirin 81 mg a day and Plavix for the history of stroke.   #Given the patient's intolerance to Lovenox injections/and the fact she might not need indefinite anticoagulation-I think it is reasonable to stop Lovenox; and then start baby aspirin and Plavix. [Plavix to be prescribed by Dr. Potter]  #However patient understand that she is high risk for DVT in future [given prior history of DVT/obesity].  If she has a recurrent acute DVT I think it be reasonable to continue indefinite anticoagulation at the time like Xarelto/Pradaxa.  DISPOSITION: # follow up in 4 months; labsd-dimer Dr.B  Cc; Dr.Khan/Boswell/ DR.Potter.  Addendum add lab encounter in 4 months  # 25 minutes face-to-face with the patient discussing the above plan of care; more than 50% of time spent on prognosis/ natural history; counseling and coordination.  

## 2018-08-17 ENCOUNTER — Telehealth: Payer: Self-pay | Admitting: *Deleted

## 2018-08-17 NOTE — Telephone Encounter (Signed)
Per Dr Melrose Nakayama, he discussed with Dr Rogue Bussing to have patient stop Lovenox and start on ASA and Plavix, but Dr B latest note says to continue Lovenox per PCP. Asking if Dr B has changed his mind about taking her off Lovenox. Call returned to [facility] [phone] and explained that most recent note form 12/11 does not state that and I had her to look at note again and she can see recommendation for ASA and Plavix

## 2018-11-23 ENCOUNTER — Other Ambulatory Visit: Payer: Self-pay | Admitting: Specialist

## 2018-11-23 DIAGNOSIS — J986 Disorders of diaphragm: Secondary | ICD-10-CM

## 2018-11-23 DIAGNOSIS — R059 Cough, unspecified: Secondary | ICD-10-CM

## 2018-11-23 DIAGNOSIS — R05 Cough: Secondary | ICD-10-CM

## 2018-11-23 DIAGNOSIS — R0602 Shortness of breath: Secondary | ICD-10-CM

## 2018-12-04 ENCOUNTER — Other Ambulatory Visit: Payer: Self-pay

## 2018-12-04 ENCOUNTER — Ambulatory Visit
Admission: RE | Admit: 2018-12-04 | Discharge: 2018-12-04 | Disposition: A | Discharge status: Home / Self Care | Payer: Medicare Other | Source: Ambulatory Visit | Attending: Specialist | Admitting: Specialist

## 2018-12-04 DIAGNOSIS — J986 Disorders of diaphragm: Secondary | ICD-10-CM | POA: Diagnosis present

## 2018-12-04 DIAGNOSIS — R0602 Shortness of breath: Secondary | ICD-10-CM

## 2018-12-04 DIAGNOSIS — R05 Cough: Secondary | ICD-10-CM

## 2018-12-04 DIAGNOSIS — R059 Cough, unspecified: Secondary | ICD-10-CM

## 2018-12-06 ENCOUNTER — Other Ambulatory Visit: Payer: Self-pay | Admitting: Specialist

## 2018-12-06 DIAGNOSIS — J4520 Mild intermittent asthma, uncomplicated: Secondary | ICD-10-CM

## 2018-12-06 DIAGNOSIS — J986 Disorders of diaphragm: Secondary | ICD-10-CM

## 2018-12-06 DIAGNOSIS — G4733 Obstructive sleep apnea (adult) (pediatric): Secondary | ICD-10-CM

## 2018-12-20 ENCOUNTER — Inpatient Hospital Stay: Payer: Medicare Other | Admitting: Internal Medicine

## 2018-12-20 ENCOUNTER — Inpatient Hospital Stay: Payer: Medicare Other

## 2019-01-08 ENCOUNTER — Ambulatory Visit
Admission: RE | Admit: 2019-01-08 | Discharge: 2019-01-08 | Disposition: A | Discharge status: Home / Self Care | Payer: Medicare Other | Source: Ambulatory Visit | Attending: Specialist | Admitting: Specialist

## 2019-01-08 ENCOUNTER — Other Ambulatory Visit: Payer: Self-pay

## 2019-01-08 DIAGNOSIS — J4520 Mild intermittent asthma, uncomplicated: Secondary | ICD-10-CM

## 2019-01-08 DIAGNOSIS — G4733 Obstructive sleep apnea (adult) (pediatric): Secondary | ICD-10-CM

## 2019-01-08 DIAGNOSIS — J986 Disorders of diaphragm: Secondary | ICD-10-CM

## 2019-01-09 DIAGNOSIS — Z86718 Personal history of other venous thrombosis and embolism: Secondary | ICD-10-CM | POA: Insufficient documentation

## 2019-01-22 ENCOUNTER — Encounter: Payer: Self-pay | Admitting: Emergency Medicine

## 2019-01-22 ENCOUNTER — Emergency Department
Admission: EM | Admit: 2019-01-22 | Discharge: 2019-01-22 | Disposition: A | Discharge status: Home / Self Care | Payer: Medicare Other | Attending: Emergency Medicine | Admitting: Emergency Medicine

## 2019-01-22 ENCOUNTER — Other Ambulatory Visit: Payer: Self-pay

## 2019-01-22 DIAGNOSIS — Z79899 Other long term (current) drug therapy: Secondary | ICD-10-CM | POA: Insufficient documentation

## 2019-01-22 DIAGNOSIS — M7989 Other specified soft tissue disorders: Secondary | ICD-10-CM | POA: Insufficient documentation

## 2019-01-22 DIAGNOSIS — M25512 Pain in left shoulder: Secondary | ICD-10-CM | POA: Diagnosis not present

## 2019-01-22 DIAGNOSIS — J45909 Unspecified asthma, uncomplicated: Secondary | ICD-10-CM | POA: Diagnosis not present

## 2019-01-22 DIAGNOSIS — Z8673 Personal history of transient ischemic attack (TIA), and cerebral infarction without residual deficits: Secondary | ICD-10-CM | POA: Diagnosis not present

## 2019-01-22 DIAGNOSIS — Z7982 Long term (current) use of aspirin: Secondary | ICD-10-CM | POA: Diagnosis not present

## 2019-01-22 DIAGNOSIS — I10 Essential (primary) hypertension: Secondary | ICD-10-CM | POA: Insufficient documentation

## 2019-01-22 LAB — CBC WITH DIFFERENTIAL/PLATELET
Abs Immature Granulocytes: 0 10*3/uL (ref 0.00–0.07)
Basophils Absolute: 0.1 10*3/uL (ref 0.0–0.1)
Basophils Relative: 1 %
Eosinophils Absolute: 0.2 10*3/uL (ref 0.0–0.5)
Eosinophils Relative: 5 %
HCT: 35.2 % — ABNORMAL LOW (ref 36.0–46.0)
Hemoglobin: 11.9 g/dL — ABNORMAL LOW (ref 12.0–15.0)
Immature Granulocytes: 0 %
Lymphocytes Relative: 40 %
Lymphs Abs: 2.1 10*3/uL (ref 0.7–4.0)
MCH: 28.6 pg (ref 26.0–34.0)
MCHC: 33.8 g/dL (ref 30.0–36.0)
MCV: 84.6 fL (ref 80.0–100.0)
Monocytes Absolute: 0.4 10*3/uL (ref 0.1–1.0)
Monocytes Relative: 7 %
Neutro Abs: 2.4 10*3/uL (ref 1.7–7.7)
Neutrophils Relative %: 47 %
Platelets: 192 10*3/uL (ref 150–400)
RBC: 4.16 MIL/uL (ref 3.87–5.11)
RDW: 15.9 % — ABNORMAL HIGH (ref 11.5–15.5)
WBC: 5.1 10*3/uL (ref 4.0–10.5)
nRBC: 0 % (ref 0.0–0.2)

## 2019-01-22 LAB — COMPREHENSIVE METABOLIC PANEL
ALT: 16 U/L (ref 0–44)
AST: 17 U/L (ref 15–41)
Albumin: 4.3 g/dL (ref 3.5–5.0)
Alkaline Phosphatase: 47 U/L (ref 38–126)
Anion gap: 8 (ref 5–15)
BUN: 14 mg/dL (ref 6–20)
CO2: 29 mmol/L (ref 22–32)
Calcium: 9.3 mg/dL (ref 8.9–10.3)
Chloride: 103 mmol/L (ref 98–111)
Creatinine, Ser: 0.98 mg/dL (ref 0.44–1.00)
GFR calc Af Amer: >60 mL/min (ref >60)
GFR calc non Af Amer: >60 mL/min (ref >60)
Glucose, Bld: 120 mg/dL — ABNORMAL HIGH (ref 70–99)
Potassium: 3.6 mmol/L (ref 3.5–5.1)
Sodium: 140 mmol/L (ref 135–145)
Total Bilirubin: 0.8 mg/dL (ref 0.3–1.2)
Total Protein: 7.2 g/dL (ref 6.5–8.1)

## 2019-01-22 MED ORDER — FUROSEMIDE 20 MG PO TABS: 20.0000 mg | ORAL_TABLET | Freq: Every day | ORAL | 0 refills | Status: DC

## 2019-01-22 NOTE — Discharge Instructions (Addendum)
Please seek medical attention for any high fevers, chest pain, shortness of breath, change in behavior, persistent vomiting, bloody stool or any other new or concerning symptoms.  

## 2019-01-22 NOTE — ED Triage Notes (Signed)
C/O right leg swelling and left shoulder pain.  C/O symptoms "for a while".  Patient is AAOx3.  Skin warm and dry. NAD

## 2019-01-22 NOTE — ED Provider Notes (Signed)
[facility] Emergency Department Provider Note ____________________________________________   I have reviewed the triage vital signs and the nursing notes.   HISTORY  Chief Complaint Leg Swelling   History limited by: Not Limited   HPI Lindsay Cooke is a 58 y.o. female who presents to the emergency department today because of concern for fluid in the legs. She states that she has been having problems with her legs for months. She has been having pain and cramping in both legs for months. She came in tonight because one of her nieces suggested that she could have fluid in her legs. The patient denies any history of heart failure. She says she has had some shortness of breath for the past couple of months as well. The patient also is complaining of left shoulder pain for the past couple of days. Pain is located at the distal deltoid muscle. She says she has had this pain in the past and was told it was tendonitis.    Records reviewed. Per medical record review patient has a history of chronic knee pain, chronic shoulder pain. DVT.   Past Medical History:  Diagnosis Date  . Allergy   . Anemia   . Asthma   . Benign essential hypertension   . Hyperlipidemia   . Lumbar spondylosis   . Obesity   . Reflux   . Right leg DVT [facility])     Patient Active Problem List   Diagnosis Date Noted  . Positive occult stool blood test   . Chronic knee pain (Primary Area of Pain) (Bilateral) (R>L) 05/30/2017  . Chronic shoulder pain (Secondary Area of Pain) (Bilateral) (R>L) 05/30/2017  . Chronic upper back pain [facility]) 05/30/2017  . Chronic pain syndrome 05/30/2017  . Long term current use of opiate analgesic 05/30/2017  . Long term prescription opiate use 05/30/2017  . Opiate use 05/30/2017  . Other reduced mobility 05/30/2017  . Disorder of bone, unspecified 05/30/2017  . Allergic state 05/27/2017  . Arthritis 05/27/2017  . Asthma without status  asthmaticus 05/27/2017  . Hyperlipidemia 05/27/2017  . Sleep apnea 05/27/2017  . Schizoaffective-type schizophrenia ([place]) 05/27/2017  . OSA on CPAP 05/27/2017  . OSA (obstructive sleep apnea) 05/27/2017  . Dyslipidemia 07/20/2016  . Mini stroke ([place]) 12/17/2015  . Mild intermittent asthma without complication 43/15/4008  . Recurrent major depressive disorder, in remission ([place]) 12/17/2015  . Pre-diabetes 12/17/2015  . CVA (cerebral vascular accident) ([place]) 11/23/2015  . Hemoptysis 11/23/2015  . Acute deep vein thrombosis (DVT) of proximal vein of right lower extremity ([place]) 11/23/2015  . History of pulmonary embolism 10/01/2015  . Hearing loss in right ear 08/18/2012  . FATIGUE 10/03/2008  . SNORING 10/03/2008  . GUAIAC POSITIVE STOOL 05/07/2008  . GASTROENTERITIS 02/23/2008  . Depression 11/27/2007  . MEDIAL MENISCUS TEAR, RIGHT 11/20/2007  . ANEMIA 11/15/2007  . Osteoarthritis of knee 11/15/2007  . Gastroesophageal reflux disease without esophagitis 09/06/2007  . Morbid obesity ([place]) 07/21/2007  . Hypertension 07/21/2007  . ASTHMA 07/21/2007  . Keloid scar 07/21/2007    Past Surgical History:  Procedure Laterality Date  . CHOLECYSTECTOMY    . COLONOSCOPY WITH PROPOFOL N/A 06/16/2017   Procedure: COLONOSCOPY WITH PROPOFOL;  Surgeon: Lin Landsman, MD;  Location: [facility] ENDOSCOPY;  Service: Gastroenterology;  Laterality: N/A;  . HEEL SPUR EXCISION    . HERNIA REPAIR    . KELOID EXCISION      Prior to Admission medications   Medication Sig Start Date  End Date Taking? Authorizing Provider  albuterol (PROVENTIL HFA;VENTOLIN HFA) 108 (90 Base) MCG/ACT inhaler Inhale 2 puffs into the lungs every 6 (six) hours as needed for wheezing or shortness of breath. 06/30/16  Yes Schaevitz, Randall An, MD  aspirin 81 MG chewable tablet Chew 81 mg by mouth daily.   Yes [provider]  atorvastatin (LIPITOR) 40 MG tablet Take 40 mg by mouth daily.  04/11/17  Yes [provider]  cetirizine (ZYRTEC) 10 MG tablet Take 10 mg by mouth daily.   Yes [provider]  clopidogrel (PLAVIX) 75 MG tablet Take 75 mg by mouth daily.   Yes [provider]  fluticasone (FLOVENT HFA) 110 MCG/ACT inhaler Inhale 1 puff into the lungs 2 (two) times daily.   Yes [provider]  gabapentin (NEURONTIN) 100 MG capsule Take 100 mg by mouth 2 (two) times daily.   Yes [provider]  losartan-hydrochlorothiazide (HYZAAR) 100-25 MG tablet Take 1 tablet by mouth daily. 04/25/15  Yes [provider]  montelukast (SINGULAIR) 10 MG tablet Take 10 mg by mouth daily.    Yes [provider]  omeprazole (PRILOSEC) 40 MG capsule Take 40 mg by mouth daily.   Yes [provider]  phentermine 30 MG capsule Take 30 mg by mouth daily.   Yes [provider]  vitamin B-12 (CYANOCOBALAMIN) 250 MCG tablet Take 250 mcg by mouth daily.   Yes [provider]  vitamin C (ASCORBIC ACID) 250 MG tablet Take 250 mg by mouth daily.   Yes [provider]    Allergies Penicillins  Family History  Problem Relation Age of Onset  . Breast cancer Sister   . Heart disease Mother   . Congestive Heart Failure Sister     Social History Social History   Tobacco Use  . Smoking status: Never Smoker  . Smokeless tobacco: Never Used  Substance Use Topics  . Alcohol use: No    Alcohol/week: 0.0 standard drinks  . Drug use: No    Review of Systems Constitutional: No fever/chills Eyes: No visual changes. ENT: No sore throat. Cardiovascular: Denies chest pain. Respiratory: Positive for shortness of breath. Gastrointestinal: No abdominal pain.  No nausea, no vomiting.  No diarrhea.   Genitourinary: Negative for dysuria. Musculoskeletal: Positive for bilateral leg pain and swelling. Positive for left shoulder pain.  Skin: Negative for rash. Neurological: Negative for headaches, focal weakness or  numbness.  ____________________________________________   PHYSICAL EXAM:  VITAL SIGNS: ED Triage Vitals  Enc Vitals Group     BP 01/22/19 1455 (!) 151/82     Pulse Rate 01/22/19 1455 81     Resp --      Temp 01/22/19 1455 99.3 F (37.4 C)     Temp Source 01/22/19 1455 Oral     SpO2 --      Weight 01/22/19 1456 280 lb (127 kg)     Height 01/22/19 1456 5\' 1"  (1.549 m)     Head Circumference --      Peak Flow --      Pain Score 01/22/19 1459 10   Constitutional: Alert and oriented.  Eyes: Conjunctivae are normal.  ENT      Head: Normocephalic and atraumatic.      Nose: No congestion/rhinnorhea.      Mouth/Throat: Mucous membranes are moist.      Neck: No stridor. Hematological/Lymphatic/Immunilogical: No cervical lymphadenopathy. Cardiovascular: Normal rate, regular rhythm.  No murmurs, rubs, or gallops.  Respiratory: Normal respiratory  effort without tachypnea nor retractions. Breath sounds are clear and equal bilaterally. No wheezes/rales/rhonchi. Gastrointestinal: Soft and non tender. No rebound. No guarding.  Genitourinary: Deferred Musculoskeletal: Normal range of motion in all extremities. Bilateral 1+ pitting edema. Full painless ROM of the left shoulder Neurologic:  Normal speech and language. No gross focal neurologic deficits are appreciated.  Skin:  Skin is warm, dry and intact. No rash noted. Psychiatric: Mood and affect are normal. Speech and behavior are normal. Patient exhibits appropriate insight and judgment.  ____________________________________________    LABS (pertinent positives/negatives)  CMP wnl except glu 120 CBC wbc 5.1, hgb 11.9, plt 192  ____________________________________________   EKG  I, Nance Pear, attending physician, personally viewed and interpreted this EKG  EKG Time: 1507 Rate: 79 Rhythm: normal sinus rhythm Axis: left axis deviation Intervals: qtc 483 QRS: Incomplete RBBB ST changes: no st elevation Impression:  abnormal ekg   ____________________________________________    RADIOLOGY  None  ____________________________________________   PROCEDURES  Procedures  ____________________________________________   INITIAL IMPRESSION / ASSESSMENT AND PLAN / ED COURSE  Pertinent labs & imaging results that were available during my care of the patient were reviewed by me and considered in my medical decision making (see chart for details).   Patient presented to the emergency department today because of concerns for possible fluid overload.  Patient states she has been having problems with some swelling and cramping of her legs for months.  Only got him IV fluid today when a family member suggested that.  Patient however denies any history of heart failure.  Patient states that she also has had some shortness of breath over this time.  She denies any sharp chest pain.  On my exam patient did have some bilateral pitting edema although no tenderness palpation of the legs.  Lungs without any crackles.  Did discuss with patient that possible she had small fluid overload.  Will trial patient on Lasix.  This point I doubt blood clot given length of symptoms and no tenderness. ____________________________________________   FINAL CLINICAL IMPRESSION(S) / ED DIAGNOSES  Final diagnoses:  Leg swelling  Left shoulder pain, unspecified chronicity     Note: This dictation was prepared with Dragon dictation. Any transcriptional errors that result from this process are unintentional     Nance Pear, MD 01/22/19 2107

## 2019-01-30 DIAGNOSIS — Z8673 Personal history of transient ischemic attack (TIA), and cerebral infarction without residual deficits: Secondary | ICD-10-CM | POA: Insufficient documentation

## 2019-02-19 ENCOUNTER — Inpatient Hospital Stay: Payer: Medicare Other | Admitting: Internal Medicine

## 2019-02-19 ENCOUNTER — Inpatient Hospital Stay: Payer: Medicare Other

## 2019-02-19 NOTE — Assessment & Plan Note (Deleted)
#   Right lower extremity DVT 2 [PCP office; March & sep 2016]-currently on Lovenox.   #In general I would have recommended 6 months anticoagulation for this patient-given her prior hypercoagulable work-up was negative.  I am little hesitant to put patients on indefinite anticoagulation for life with 2 episodes of DVT.  Patient's multiple ultrasound done to date have been so far negative for any evidence of acute or chronic DVT.   #History of acute stroke while on Eliquis-currently on Lovenox.  I discussed with Dr. Lynden Oxford recommends aspirin 81 mg a day and Plavix for the history of stroke.   #Given the patient's intolerance to Lovenox injections/and the fact she might not need indefinite anticoagulation-I think it is reasonable to stop Lovenox; and then start baby aspirin and Plavix. [Plavix to be prescribed by Dr. Melrose Nakayama  #However patient understand that she is high risk for DVT in future [given prior history of DVT/obesity].  If she has a recurrent acute DVT I think it be reasonable to continue indefinite anticoagulation at the time like Xarelto/Pradaxa.  DISPOSITION: # follow up in 4 months; labsd-dimer Dr.B  Cc; Dr.Khan/[place]/ DR.Potter.  Addendum add lab encounter in 4 months  # 25 minutes face-to-face with the patient discussing the above plan of care; more than 50% of time spent on prognosis/ natural history; counseling and coordination.

## 2019-02-19 NOTE — Progress Notes (Deleted)
[name] NOTE  Patient Care Team: Danelle Berry, NP as PCP - General (Nurse Practitioner) Dionisio David, MD as Consulting Physician (Cardiology)  CHIEF COMPLAINTS/PURPOSE OF CONSULTATION:  DVT recurrent  HEMATOLOGIC HISTORY:  # ? MARCH 2016- DVT on Eliquis ; SEP 2016-DVT [RIGHT sup Fem vein; PCP office] Prothrombin gene mutation; factor V Leiden negative. Anticardiolipin antibody negative; On Eliquis; March 2017- Stopped Eliquis [given stroke]; Started Coumadin [difficulty in controlling the INR; blood in stools]; currently Lovenox 80 mg a day.   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 morbidly obese female with history of right lower extremity DVT 2; and also history of stroke in March 2017 [while on Eliquis]- is currently on Lovenox is here for follow-up.  Patient states that she is getting " tired taking shots".  She wants to come off the Lovenox injections.  She has been recently evaluated by neurology.  Patient has not had any repeated blood clots or strokes.   Review of Systems  Constitutional: Negative for chills, diaphoresis, fever, malaise/fatigue and weight loss.  HENT: Negative for nosebleeds and sore throat.   Eyes: Negative for double vision.  Respiratory: Negative for cough, hemoptysis, sputum production, shortness of breath and wheezing.   Cardiovascular: Negative for chest pain, palpitations, orthopnea and leg swelling.  Gastrointestinal: Negative for abdominal pain, blood in stool, constipation, diarrhea, heartburn, melena, nausea and vomiting.  Genitourinary: Negative for dysuria, frequency and urgency.  Musculoskeletal: Negative for back pain and joint pain.  Skin: Negative.  Negative for itching and rash.  Neurological: Negative for dizziness, tingling, focal  weakness, weakness and headaches.  Endo/Heme/Allergies: Does not bruise/bleed easily.  Psychiatric/Behavioral: Negative for depression. The patient is not nervous/anxious and does not have insomnia.      MEDICAL HISTORY:  Past Medical History:  Diagnosis Date  . Allergy   . Anemia   . Asthma   . Benign essential hypertension   . Hyperlipidemia   . Lumbar spondylosis   . Obesity   . Reflux   . Right leg DVT ([place])     SURGICAL HISTORY: Past Surgical History:  Procedure Laterality Date  . CHOLECYSTECTOMY    . COLONOSCOPY WITH PROPOFOL N/A 06/16/2017   Procedure: COLONOSCOPY WITH PROPOFOL;  Surgeon: Lin Landsman, MD;  Location: [facility] ENDOSCOPY;  Service: Gastroenterology;  Laterality: N/A;  . HEEL SPUR EXCISION    . HERNIA REPAIR    . KELOID EXCISION      SOCIAL HISTORY: Social History   Socioeconomic History  . Marital status: Single    Spouse name: Not on file  . Number of children: Not on file  . Years of education: Not on file  . Highest education level: Not on file  Occupational History  . Not on file  Social Needs  . Financial resource strain: Not on file  . Food insecurity    Worry: Not on file    Inability: Not on file  . Transportation needs    Medical: Not on file    Non-medical: Not on file  Tobacco Use  . Smoking status: Never Smoker  . Smokeless tobacco: Never Used  Substance and Sexual Activity  . Alcohol use: No    Alcohol/week: 0.0 standard drinks  . Drug use: No  . Sexual activity: Never  Lifestyle  . Physical activity  Days per week: Not on file    Minutes per session: Not on file  . Stress: Not on file  Relationships  . Social Herbalist on phone: Not on file    Gets together: Not on file    Attends religious service: Not on file    Active member of club or organization: Not on file    Attends meetings of clubs or organizations: Not on file    Relationship status: Not on file  . Intimate partner violence    Fear  of current or ex partner: Not on file    Emotionally abused: Not on file    Physically abused: Not on file    Forced sexual activity: Not on file  Other Topics Concern  . Not on file  Social History Narrative  . Not on file    FAMILY HISTORY: Family History  Problem Relation Age of Onset  . Breast cancer Sister   . Heart disease Mother   . Congestive Heart Failure Sister     ALLERGIES:  is allergic to penicillins.  MEDICATIONS:  Current Outpatient Medications  Medication Sig Dispense Refill  . albuterol (PROVENTIL HFA;VENTOLIN HFA) 108 (90 Base) MCG/ACT inhaler Inhale 2 puffs into the lungs every 6 (six) hours as needed for wheezing or shortness of breath. 1 Inhaler 0  . aspirin 81 MG chewable tablet Chew 81 mg by mouth daily.    Marland Kitchen atorvastatin (LIPITOR) 40 MG tablet Take 40 mg by mouth daily.     . cetirizine (ZYRTEC) 10 MG tablet Take 10 mg by mouth daily.    . clopidogrel (PLAVIX) 75 MG tablet Take 75 mg by mouth daily.    . fluticasone (FLOVENT HFA) 110 MCG/ACT inhaler Inhale 1 puff into the lungs 2 (two) times daily.    . furosemide (LASIX) 20 MG tablet Take 1 tablet (20 mg total) by mouth daily for 5 days. 5 tablet 0  . gabapentin (NEURONTIN) 100 MG capsule Take 100 mg by mouth 2 (two) times daily.    Marland Kitchen losartan-hydrochlorothiazide (HYZAAR) 100-25 MG tablet Take 1 tablet by mouth daily.    . montelukast (SINGULAIR) 10 MG tablet Take 10 mg by mouth daily.     Marland Kitchen omeprazole (PRILOSEC) 40 MG capsule Take 40 mg by mouth daily.    . phentermine 30 MG capsule Take 30 mg by mouth daily.    . vitamin B-12 (CYANOCOBALAMIN) 250 MCG tablet Take 250 mcg by mouth daily.    . vitamin C (ASCORBIC ACID) 250 MG tablet Take 250 mg by mouth daily.     No current facility-administered medications for this visit.       Marland Kitchen  PHYSICAL EXAMINATION: ECOG PERFORMANCE STATUS: 1 - Symptomatic but completely ambulatory  There were no vitals filed for this visit. There were no vitals filed for  this visit.  Physical Exam  Constitutional: She is oriented to person, place, and time and well-developed, well-nourished, and in no distress.  Obese.  Alone.  HENT:  Head: Normocephalic and atraumatic.  Mouth/Throat: Oropharynx is clear and moist. No oropharyngeal exudate.  Eyes: Pupils are equal, round, and reactive to light.  Neck: Normal range of motion. Neck supple.  Cardiovascular: Normal rate and regular rhythm.  Pulmonary/Chest: No respiratory distress. She has no wheezes.  Abdominal: Soft. Bowel sounds are normal. She exhibits no distension and no mass. There is no abdominal tenderness. There is no rebound and no guarding.  Musculoskeletal: Normal range of motion.  General: No tenderness or edema.  Neurological: She is alert and oriented to person, place, and time.  Skin: Skin is warm.  Psychiatric: Affect normal.     LABORATORY DATA:  I have reviewed the data as listed Lab Results  Component Value Date   WBC 5.1 01/22/2019   HGB 11.9 (L) 01/22/2019   HCT 35.2 (L) 01/22/2019   MCV 84.6 01/22/2019   PLT 192 01/22/2019   Recent Labs    08/16/18 1309 01/22/19 1510  NA 144 140  K 3.9 3.6  CL 103 103  CO2 30 29  GLUCOSE 99 120*  BUN 14 14  CREATININE 0.89 0.98  CALCIUM 9.6 9.3  GFRNONAA >60 >60  GFRAA >60 >60  PROT 6.9 7.2  ALBUMIN 4.3 4.3  AST 20 17  ALT 21 16  ALKPHOS 52 47  BILITOT 0.8 0.8    RADIOGRAPHIC STUDIES: I have personally reviewed the radiological images as listed and agreed with the findings in the report. No results found.  ASSESSMENT & PLAN:   No problem-specific Assessment & Plan notes found for this encounter.    Cammie Sickle, MD 02/19/2019 8:29 AM

## 2019-03-08 ENCOUNTER — Other Ambulatory Visit: Payer: Self-pay

## 2019-03-12 ENCOUNTER — Inpatient Hospital Stay: Payer: Medicare Other

## 2019-03-12 ENCOUNTER — Inpatient Hospital Stay: Payer: Medicare Other | Admitting: Internal Medicine

## 2019-03-26 ENCOUNTER — Other Ambulatory Visit: Payer: Self-pay | Admitting: Nurse Practitioner

## 2019-03-26 DIAGNOSIS — Z1231 Encounter for screening mammogram for malignant neoplasm of breast: Secondary | ICD-10-CM

## 2019-04-04 ENCOUNTER — Ambulatory Visit: Payer: Medicare Other

## 2019-04-04 ENCOUNTER — Encounter: Payer: Self-pay | Admitting: Podiatry

## 2019-06-07 ENCOUNTER — Ambulatory Visit
Admission: RE | Admit: 2019-06-07 | Discharge: 2019-06-07 | Disposition: A | Discharge status: Home / Self Care | Payer: Medicare Other | Source: Ambulatory Visit | Attending: Nurse Practitioner | Admitting: Nurse Practitioner

## 2019-06-07 DIAGNOSIS — Z1231 Encounter for screening mammogram for malignant neoplasm of breast: Secondary | ICD-10-CM | POA: Diagnosis present

## 2019-06-14 ENCOUNTER — Other Ambulatory Visit: Payer: Self-pay | Admitting: Nurse Practitioner

## 2019-06-14 DIAGNOSIS — R590 Localized enlarged lymph nodes: Secondary | ICD-10-CM

## 2019-06-14 DIAGNOSIS — R928 Other abnormal and inconclusive findings on diagnostic imaging of breast: Secondary | ICD-10-CM

## 2019-06-19 ENCOUNTER — Ambulatory Visit
Admission: RE | Admit: 2019-06-19 | Discharge: 2019-06-19 | Disposition: A | Discharge status: Home / Self Care | Payer: Medicare Other | Source: Ambulatory Visit | Attending: Nurse Practitioner | Admitting: Nurse Practitioner

## 2019-06-19 DIAGNOSIS — R928 Other abnormal and inconclusive findings on diagnostic imaging of breast: Secondary | ICD-10-CM

## 2019-06-19 DIAGNOSIS — R590 Localized enlarged lymph nodes: Secondary | ICD-10-CM

## 2019-06-19 NOTE — Progress Notes (Signed)
This encounter was created in error - please disregard.

## 2019-09-18 DIAGNOSIS — M79604 Pain in right leg: Secondary | ICD-10-CM | POA: Insufficient documentation

## 2019-09-18 DIAGNOSIS — G479 Sleep disorder, unspecified: Secondary | ICD-10-CM | POA: Insufficient documentation

## 2019-09-20 DIAGNOSIS — Z6841 Body Mass Index (BMI) 40.0 and over, adult: Secondary | ICD-10-CM | POA: Insufficient documentation

## 2019-10-16 ENCOUNTER — Encounter: Payer: Self-pay | Admitting: Pain Medicine

## 2019-10-16 DIAGNOSIS — M25562 Pain in left knee: Secondary | ICD-10-CM | POA: Insufficient documentation

## 2019-10-16 DIAGNOSIS — G8929 Other chronic pain: Secondary | ICD-10-CM | POA: Insufficient documentation

## 2019-10-16 NOTE — Progress Notes (Signed)
Patient: Lindsay Cooke  Service Category: E/M  Provider: Gaspar Cola, MD  DOB: October 08, 1960  DOS: 10/17/2019  Location: Office  MRN: [id number]  Setting: Ambulatory outpatient  Referring Provider: Diamond Nickel, DO  Type: New Patient  Specialty: Interventional Pain Management  PCP: Danelle Berry, NP  Location: Remote location  Delivery: TeleHealth     Virtual Encounter - Pain Management PROVIDER NOTE: Information contained herein reflects review and annotations entered in association with encounter. Interpretation of such information and data should be left to medically-trained personnel. Information provided to patient can be located elsewhere in the medical record under "Patient Instructions". Document created using STT-dictation technology, any transcriptional errors that may result from process are unintentional.    Contact & Pharmacy Preferred: [phone] Home: [phone] (home) Mobile: [phone] (mobile) E-mail: [email or web address]  [place], [place]. [place] [address] Phone: [phone] Fax: [phone]  [facility] [place], [place] [address] Phone: [phone] Fax: [facility] [address]), [place]) [place] [address] Phone: [phone] Fax: [phone]   Pre-screening note:  Our staff contacted Lindsay Cooke and offered her an "in person", "face-to-face" appointment versus a telephone encounter. She indicated preferring the telephone encounter, at this time.  Primary Reason(s) for Visit: Tele-Encounter for initial evaluation of one or more chronic problems (new to examiner) potentially causing chronic pain, and posing a threat to normal musculoskeletal function. (Level of risk: High) CC: Knee Pain (right)  I contacted Lindsay Cooke on 10/17/2019 via telephone.  The  initial contact took place at 3:45 PM and the patient was at the grocery store.  For some unknown reason she put on the phone a Mr. Hart Carwin, who did not give me his last name but identified himself as a sword Electrical engineer.  Eventually I got Lindsay Cooke back on the phone but she asked me to call her later because somebody else was calling her to her phone.  I told her that I would try contacting her in about half an hour or so.      I clearly identified myself as Gaspar Cola, MD. I verified that I was speaking with the correct person using two identifiers (Name: Lindsay Cooke, and date of birth: [birth year]).  Advanced Informed Consent I sought verbal advanced consent from Lindsay Cooke for virtual visit interactions. I informed Lindsay Cooke of possible security and privacy concerns, risks, and limitations associated with providing "not-in-person" medical evaluation and management services. I also informed Lindsay Cooke of the availability of "in-person" appointments. Finally, I informed her that there would be a charge for the virtual visit and that she could be  personally, fully or partially, financially responsible for it. Lindsay Cooke expressed understanding and agreed to proceed.   HPI  Lindsay Cooke is a 59 y.o. year old, female patient, contacted today for an initial evaluation of her chronic pain. She has Morbid obesity ([place]); ANEMIA; Depression; Hypertension; ASTHMA; Gastroesophageal reflux disease without esophagitis; GASTROENTERITIS; GUAIAC POSITIVE STOOL; Keloid scar; Osteoarthritis of knee (Right); FATIGUE; SNORING; MEDIAL MENISCUS TEAR, RIGHT; CVA (cerebral vascular accident) ([place]); Hemoptysis; Acute deep vein thrombosis (DVT) of proximal vein of right lower extremity ([place]); Allergic state; Arthritis; Asthma without status asthmaticus; Dyslipidemia; Mini stroke ([place]); Mild intermittent asthma without complication; Hyperlipidemia; History of pulmonary embolus (PE); Hearing loss in right  ear; Sleep  apnea; Schizoaffective-type schizophrenia ([place]); Recurrent major depressive disorder, in remission ([place]); Pre-diabetes; OSA on CPAP; OSA (obstructive sleep apnea); Chronic shoulder pain (Secondary Area of Pain) (Bilateral) (R>L); Chronic upper back pain [facility] Area of Pain); Chronic pain syndrome; Long term current use of opiate analgesic; Long term prescription opiate use; Opiate use; Other reduced mobility; Disorder of bone, unspecified; Positive occult stool blood test; Chronic deep vein thrombosis (DVT) of distal vein of right lower extremity ([place]); History of deep vein thrombosis of lower extremity; History of transient ischemic attack (TIA); TIA (transient ischemic attack); BMI 50.0-59.9, adult ([place]); Right leg pain; Sleep disorder; Chronic knee pain (Primary Area of Pain) (Bilateral) (R>L); and Chronic knee pain (Right) on their problem list.   Onset and Duration: Sudden and Date of onset: More than 2 years Cause of pain: Arthritis Severity: No change since onset, NAS-11 at its worse: 10/10, NAS-11 at its best: 6/10, NAS-11 now: 6/10 and NAS-11 on the average: 10/10 Timing: Morning, During activity or exercise, After activity or exercise and After a period of immobility Aggravating Factors: Walking Alleviating Factors: Medications and Resting Associated Problems: Swelling Quality of Pain: Aching Previous Examinations or Tests: X-rays and Orthopedic evaluation Previous Treatments: Narcotic medications, Physical therapy, steroid by mouth and steroid injections  This patient was previously seen in our practice some 05/30/2017, but she never returned for the follow-up appointment.  At that time, according to the patient her primary area of pain was in her knees (Bilateral). She admited that the right was greater than the left (R>L). She denied any swelling, numbness, tingling or weakness.She denied any previous surgeries She indicated having had a steroid injection by [facility] in the past which  was not effective. She denied any physical therapy. She has had images; xrays. She admited that she was told; she needed to lose weight prior to having knee replacement. She indicated that she would be following up at the bariatric clinic.  Her second area pain was in her shoulders (Bilateral). She admited that the right was greater than the left (R>L). She felt like the pain was worse at night. She denied any numbness tingling or weakness in her arms. She denied any neck pain. She had a left shoulder injection in 2018 by [facility]. She stated that it was not effective. She denied any physical therapy. She did have x-rays.  Her third area of pain was in her upper back (Bilateral). She felt the pain was across her shoulders. She denied any previous surgeries, interventional therapy, physical therapy or recent images.  Today the patient indicates that here 1 and only pain is that of the right knee and that she was referred to [place] so that we would "give her pain medicine for the knee pain".  I asked her if she was having pain anywhere else and she indicated that she was not.  An x-ray of the right knee done on 09/20/2019 at a [facility] facility reads as follows:  Right knee radiographs (3 views) (09/20/2019)  FINDINGS:  Varus tibiofemoral alignment.Normal patellofemoral alignment.No  fractures or dislocations.No soft tissue swelling or joint effusion. There are tricompartmental degenerative joint changes worse in the medial compartment.The medial compartment shows complete loss of joint space, osteophyte formation, subchondral sclerosis.The lateral compartment  shows subchondral sclerosis.The patellofemoral compartment shows joint  space narrowing, osteophyte formation.There are also osteophytes along the anterior and posterior tibia as well as the posterior femur.No loose  bodies. No abnormal bone lesions.Enthesopathic change to the quadriceps attachment.  The PMP was really noncontributory since  it did not show any type of opioid analgesic dating back to 01/16/2019.  According to the patient, she went to [facility] or she was given some knee injections to try to help with the pain.  She refers that after 6 to 7 injections, her pain still remained the same and the best that she would get is about 1 month of pain relief from those.  She refers that she was told that she has severe knee arthritis and that she would probably need a knee replacement.  However, she was also told that because she is 280 pounds and 5 feet 1 inches tall, she would really not be a good candidate for that.  She was referred to the bariatric clinic, but she refers that this is too far away and she does not drive.  She was then sent to the general clinic orthopedic department where she was seen by Dr. Rosalia Hammers, who apparently evaluated the patient and then suggested that she go over to the [place] pain clinic since her BMI was above 50 and a he felt that he could not help her.  The patient is now contacting us to see what is it that we can offer her.  She indicated that she is not interested in any further intra-articular knee injection since they do not help.  I explained to the patient that we will take patients for medication management and my specialty is interventional pain management and therefore will write to is injection therapies, nerve blocks, radiofrequencies, implants for pain, and interventional therapies of that nature.  I told the patient that we may be able to do a right sided genicular nerve block and if that was the case, she could be a candidate for radiofrequency ablation assuming that I can reach the target area.  She understood and accepted and she indicated that she would be interested.  We will be scheduling the patient for a diagnostic right-sided genicular nerve block.  Historic Controlled Substance Pharmacotherapy Review  Current opioid analgesics:  None Highest recorded MME/day: 0  mg/day MME/day: 0 mg/day   Historical Monitoring: The patient  reports no history of drug use. List of all UDS Test(s): No results found. List of other Serum/Urine Drug Screening Test(s):  No results found. Historical Background Evaluation: [place] PMP: PDMP reviewed during this encounter. Two (2) year initial data search conducted.             PMP NARX Score Report:  Narcotic: 070 Sedative: 131 Stimulant: 050 Risk Assessment Profile: PMP NARX Overdose Risk Score: 030  Pharmacologic Plan: As per protocol, I have not taken over any controlled substance management, pending the results of ordered tests and/or consults.            Initial impression: Pending review of available data and ordered tests.  Meds   Current Outpatient Medications:  .  albuterol (PROVENTIL HFA;VENTOLIN HFA) 108 (90 Base) MCG/ACT inhaler, Inhale 2 puffs into the lungs every 6 (six) hours as needed for wheezing or shortness of breath., Disp: 1 Inhaler, Rfl: 0 .  aspirin 81 MG chewable tablet, Chew 81 mg by mouth daily., Disp: , Rfl:  .  atorvastatin (LIPITOR) 40 MG tablet, Take 40 mg by mouth daily. , Disp: , Rfl:  .  cetirizine (ZYRTEC) 10 MG tablet, Take 10 mg by mouth daily., Disp: , Rfl:  .  clopidogrel (PLAVIX) 75 MG tablet, Take 75 mg by mouth daily., Disp: , Rfl:  .  fluticasone (FLOVENT HFA) 110 MCG/ACT inhaler, Inhale 1 puff into the lungs 2 (two) times daily., Disp: , Rfl:  .  gabapentin (NEURONTIN) 100 MG capsule, Take 100 mg by mouth 2 (two) times daily., Disp: , Rfl:  .  losartan-hydrochlorothiazide (HYZAAR) 100-25 MG tablet, Take 1 tablet by mouth daily., Disp: , Rfl:  .  montelukast (SINGULAIR) 10 MG tablet, Take 10 mg by mouth daily. , Disp: , Rfl:  .  omeprazole (PRILOSEC) 40 MG capsule, Take 40 mg by mouth daily., Disp: , Rfl:  .  phentermine 30 MG capsule, Take 30 mg by mouth daily., Disp: , Rfl:  .  furosemide (LASIX) 20 MG tablet, Take 1 tablet (20 mg total) by mouth daily for 5 days., Disp: 5  tablet, Rfl: 0  ROS  Cardiovascular: Heart trouble and Blood thinners:  Antiplatelet Pulmonary or Respiratory: No reported pulmonary signs or symptoms such as wheezing and difficulty taking a deep full breath (Asthma), difficulty blowing air out (Emphysema), coughing up mucus (Bronchitis), persistent dry cough, or temporary stoppage of breathing during sleep Neurological: Stroke (Residual deficits or weakness: unknown) Psychological-Psychiatric: No reported psychological or psychiatric signs or symptoms such as difficulty sleeping, anxiety, depression, delusions or hallucinations (schizophrenial), mood swings (bipolar disorders) or suicidal ideations or attempts Gastrointestinal: Reflux or heatburn Genitourinary: No reported renal or genitourinary signs or symptoms such as difficulty voiding or producing urine, peeing blood, non-functioning kidney, kidney stones, difficulty emptying the bladder, difficulty controlling the flow of urine, or chronic kidney disease Hematological: No reported hematological signs or symptoms such as prolonged bleeding, low or poor functioning platelets, bruising or bleeding easily, hereditary bleeding problems, low energy levels due to low hemoglobin or being anemic Endocrine: No reported endocrine signs or symptoms such as high or low blood sugar, rapid heart rate due to high thyroid levels, obesity or weight gain due to slow thyroid or thyroid disease Rheumatologic: Joint aches and or swelling due to excess weight (Osteoarthritis) Musculoskeletal: Negative for myasthenia gravis, muscular dystrophy, multiple sclerosis or malignant hyperthermia Work History: Disabled  Allergies  Lindsay Cooke is allergic to penicillins.  Laboratory Chemistry Profile   Renal Lab Results  Component Value Date   BUN 14 01/22/2019   CREATININE 0.98 01/22/2019   BCR 19 05/30/2017   GFRAA >60 01/22/2019   GFRNONAA >60 01/22/2019   PROTEINUR NEGATIVE 11/28/2015    Electrolytes Lab  Results  Component Value Date   NA 140 01/22/2019   K 3.6 01/22/2019   CL 103 01/22/2019   CALCIUM 9.3 01/22/2019   MG 2.0 05/30/2017    Hepatic Lab Results  Component Value Date   AST 17 01/22/2019   ALT 16 01/22/2019   ALBUMIN 4.3 01/22/2019   ALKPHOS 47 01/22/2019   LIPASE 87 10/12/2013    ID Lab Results  Component Value Date   HIV NON REAC 09/06/2007    Bone Lab Results  Component Value Date   25OHVITD1 36 05/30/2017   25OHVITD2 <1.0 05/30/2017   25OHVITD3 36 05/30/2017    Endocrine Lab Results  Component Value Date   GLUCOSE 120 (H) 01/22/2019   GLUCOSEU NEGATIVE 11/28/2015   HGBA1C 5.4 11/24/2015   TSH 2.113 09/06/2007    Neuropathy Lab Results  Component Value Date   VITAMINB12 387 05/30/2017   HGBA1C 5.4 11/24/2015   HIV NON REAC 09/06/2007    CNS No results found.  Inflammation (CRP: Acute  ESR: Chronic) Lab Results  Component Value Date   CRP 3.6 05/30/2017   ESRSEDRATE 9  05/30/2017    Rheumatology No results found.  Coagulation Lab Results  Component Value Date   INR 0.88 05/18/2018   LABPROT 11.9 05/18/2018   PLT 192 01/22/2019   DDIMER (H) 06/08/2008    1.25        AT THE INHOUSE ESTABLISHED CUTOFF VALUE OF 0.48 ug/mL FEU, THIS ASSAY HAS BEEN DOCUMENTED IN THE LITERATURE TO HAVE    Cardiovascular Lab Results  Component Value Date   BNP 34.0 12/01/2016   TROPONINI <0.03 12/01/2016   HGB 11.9 (L) 01/22/2019   HCT 35.2 (L) 01/22/2019    Screening Lab Results  Component Value Date   HIV NON REAC 09/06/2007    Cancer No results found.  Note: Lab results reviewed.  Imaging Review  Shoulder Imaging: Shoulder-L MR wo contrast:  Results for orders placed during the hospital encounter of 05/19/17  MR SHOULDER LEFT WO CONTRAST   Narrative CLINICAL DATA:  Left shoulder pain while laying down for 1 month.  EXAM: MRI OF THE LEFT SHOULDER WITHOUT CONTRAST  TECHNIQUE: Multiplanar, multisequence MR imaging of the shoulder was  performed. No intravenous contrast was administered.  COMPARISON:  None.  FINDINGS: Rotator cuff: Moderate tendinosis of the supraspinatus tendon. Infraspinatus tendon is intact. Teres minor tendon is intact. Subscapularis tendon is intact.  Muscles: No atrophy or fatty replacement of nor abnormal signal within, the muscles of the rotator cuff.  Biceps long head:  Intact.  Acromioclavicular Joint: Mild arthropathy of the acromioclavicular joint. Type II acromion. Small amount of subacromial/subdeltoid bursal fluid.  Glenohumeral Joint: No joint effusion.  No chondral defect.  Labrum: Grossly intact, but evaluation is limited by lack of intraarticular fluid.  Bones: No marrow signal abnormality. No fracture or dislocation. No aggressive osseous lesion.  Other: No fluid collection or hematoma.  IMPRESSION: 1. Moderate tendinosis of the supraspinatus tendon without a discrete tear. 2. Mild subacromial/subdeltoid bursitis.   Electronically Signed   By: Kathreen Devoid   On: 05/19/2017 10:27    Thoracic Imaging: Thoracic MR wo contrast:  Results for orders placed during the hospital encounter of 01/01/17  MR THORACIC SPINE WO CONTRAST   Narrative CLINICAL DATA:  Mid back pain radiating around the site and into the hips  EXAM: MRI THORACIC SPINE WITHOUT CONTRAST  TECHNIQUE: Multiplanar, multisequence MR imaging of the thoracic spine was performed. No intravenous contrast was administered.  COMPARISON:  None.  FINDINGS: Alignment:  Physiologic.  Vertebrae: No fracture, evidence of discitis, or bone lesion.  Cord:  Normal signal and morphology.  Paraspinal and other soft tissues: No paraspinal abnormality.  Disc levels:  Disc spaces:  Disc spaces are maintained.  T1-T2: No disc protrusion, foraminal stenosis or central canal stenosis.  T2-T3: No disc protrusion, foraminal stenosis or central canal stenosis.  T3-T4: No disc protrusion, foraminal stenosis  or central canal stenosis.  T4-T5: No disc protrusion, foraminal stenosis or central canal stenosis.  T5-T6: No disc protrusion, foraminal stenosis or central canal stenosis.  T6-T7: No disc protrusion, foraminal stenosis or central canal stenosis.  T7-T8: No disc protrusion, foraminal stenosis or central canal stenosis.  T8-T9: No disc protrusion, foraminal stenosis or central canal stenosis.  T9-T10: No disc protrusion, foraminal stenosis or central canal stenosis. Mild bilateral facet arthropathy.  T10-T11: No disc protrusion, foraminal stenosis or central canal stenosis. Mild bilateral facet arthropathy.  T11-T12: No disc protrusion, foraminal stenosis or central canal stenosis. Mild bilateral facet arthropathy.  IMPRESSION: 1. No significant disc protrusion, foraminal stenosis or central canal stenosis.  2. No acute osseous injury of the thoracic spine.   Electronically Signed   By: Kathreen Devoid   On: 01/01/2017 11:40    Lumbosacral Imaging: Lumbar CT wo contrast:  Results for orders placed during the hospital encounter of 11/28/15  CT Lumbar Spine Wo Contrast   Narrative CLINICAL DATA:  59 year old female on Coumadin presenting with midline back pain. No known injury.  EXAM: CT LUMBAR SPINE WITHOUT CONTRAST  TECHNIQUE: Multidetector CT imaging of the lumbar spine was performed without intravenous contrast administration. Multiplanar CT image reconstructions were also generated.  COMPARISON:  CT dated 11/12/2012  FINDINGS: Evaluation of the solid organs is limited in the absence of intravenous contrast.  There is no acute fracture or subluxation of the lumbar spine. The vertebral body heights and disc spaces are maintained. There is mild degenerative changes with anterior spurring. The transverse and spinous processes are intact.  The paraspinal musculature appear unremarkable. No soft tissue or intramuscular hematoma identified. There is a 3 mm  nonobstructing left renal upper pole calculus. No hydronephrosis. Small focal area of hypodensity in the medial aspect of the right kidney is not well characterized but may represent a cyst. Multiple calcific densities noted along the left gonadal vein.  The visualized abdominal aorta and IVC appear unremarkable. No dilated bowel noted in the visualized abdomen. The appendix appears unremarkable. There is no adenopathy.  IMPRESSION: No acute/ traumatic lumbar spine pathology. No soft tissue hematoma.   Electronically Signed   By: Anner Crete M.D.   On: 11/29/2015 00:30    Knee Imaging: Knee-L MR w contrast:  Results for orders placed during the hospital encounter of 04/02/16  MR Knee Left  Wo Contrast   Narrative CLINICAL DATA:  Knee pain, no prior surgery, arthritis EXAM: MRI OF THE LEFT KNEE WITHOUT CONTRAST TECHNIQUE: Multiplanar, multisequence MR imaging of the knee was performed. No intravenous contrast was administered. COMPARISON:  None. FINDINGS: MENISCI Medial meniscus: Radial tear of the posterior horn of the medial meniscus adjacent to the meniscal root. Multiloculated cystic structure which appears to arise from the periphery of the posterior horn of the medial meniscus measuring approximately 2.7 x 1.2 cm likely reflecting a para meniscal cyst. Lateral meniscus:  Intact. LIGAMENTS Cruciates: Intact ACL and PCL. Severely expanded ACL with increased signal consistent with mucinous degeneration. Collaterals: Medial collateral ligament is intact. Lateral collateral ligament complex is intact. CARTILAGE Patellofemoral: Mild partial-thickness cartilage loss of the lateral patellofemoral compartment and medial patellofemoral compartment. Medial: High-grade partial-thickness cartilage loss of the medial femoral condyle and medial tibial plateau. Lateral: Mild partial-thickness cartilage loss of the lateral femoral condyle and lateral tibial plateau. Joint:  Moderate joint effusion. Mild edema in Hoffa's fat. No plical thickening. Popliteal Fossa:  Small Baker cyst.  Intact popliteus tendon. Extensor Mechanism: Intact quadriceps tendon. Moderate tendinosis of the proximal patellar tendon. Bones: No marrow signal abnormality. No fracture or dislocation. Marginal osteophytosis of the medial femorotibial compartment. Other: None IMPRESSION: 1. Tricompartmental cartilage abnormalities as described above, most severe in the medial femorotibial compartment. 2. Radial tear of the posterior horn of the medial meniscus adjacent to the meniscal root. 3. Moderate joint effusion. 4. Moderate tendinosis of the proximal patellar tendon. Electronically Signed   By: Kathreen Devoid   On: 04/02/2016 15:59   Knee-R DG 1-2 views:  Results for orders placed during the hospital encounter of 10/31/07  DG Knee 2 Views Right   Narrative Clinical Data: Pain.  RIGHT KNEE - 2 VIEW:  Findings:  There is medial compartment joint space narrowing with osteophytes noted in the medial and patellofemoral compartments. No fracture or effusion.  IMPRESSION:   No acute finding in patient with medial and patellofemoral degenerative disease.  Provider: Margarita Mail   Complexity Note: Imaging results reviewed. Results shared with Lindsay Cooke, using Layman's terms.                         [place]  Drug: Lindsay Cooke  reports no history of drug use. Alcohol:  reports no history of alcohol use. Tobacco:  reports that she has never smoked. She has never used smokeless tobacco. Medical:  has a past medical history of Allergy, Anemia, Asthma, Benign essential hypertension, Hyperlipidemia, Lumbar spondylosis, Obesity, Reflux, and Right leg DVT ([place]). Family: family history includes Breast cancer in her sister; Congestive Heart Failure in her sister; Heart disease in her mother.  Past Surgical History:  Procedure Laterality Date  . ABDOMINAL HYSTERECTOMY    . CHOLECYSTECTOMY    .  COLONOSCOPY WITH PROPOFOL N/A 06/16/2017   Procedure: COLONOSCOPY WITH PROPOFOL;  Surgeon: Lin Landsman, MD;  Location[facility] ENDOSCOPY;  Service: Gastroenterology;  Laterality: N/A;  . HEEL SPUR EXCISION    . HERNIA REPAIR    . KELOID EXCISION     Active Ambulatory Problems    Diagnosis Date Noted  . Morbid obesity ([place]) 07/21/2007  . ANEMIA 11/15/2007  . Depression 11/27/2007  . Hypertension 07/21/2007  . ASTHMA 07/21/2007  . Gastroesophageal reflux disease without esophagitis 09/06/2007  . GASTROENTERITIS 02/23/2008  . GUAIAC POSITIVE STOOL 05/07/2008  . Keloid scar 07/21/2007  . Osteoarthritis of knee (Right) 11/15/2007  . FATIGUE 10/03/2008  . SNORING 10/03/2008  . MEDIAL MENISCUS TEAR, RIGHT 11/20/2007  . CVA (cerebral vascular accident) ([place]) 11/23/2015  . Hemoptysis 11/23/2015  . Acute deep vein thrombosis (DVT) of proximal vein of right lower extremity ([place]) 11/23/2015  . Allergic state 05/27/2017  . Arthritis 05/27/2017  . Asthma without status asthmaticus 05/27/2017  . Dyslipidemia 07/20/2016  . Mini stroke ([place]) 12/17/2015  . Mild intermittent asthma without complication 83/41/9622  . Hyperlipidemia 05/27/2017  . History of pulmonary embolus (PE) 10/01/2015  . Hearing loss in right ear 08/18/2012  . Sleep apnea 05/27/2017  . Schizoaffective-type schizophrenia ([place]) 05/27/2017  . Recurrent major depressive disorder, in remission ([place]) 12/17/2015  . Pre-diabetes 12/17/2015  . OSA on CPAP 05/27/2017  . OSA (obstructive sleep apnea) 05/27/2017  . Chronic shoulder pain (Secondary Area of Pain) (Bilateral) (R>L) 05/30/2017  . Chronic upper back pain [facility]) 05/30/2017  . Chronic pain syndrome 05/30/2017  . Long term current use of opiate analgesic 05/30/2017  . Long term prescription opiate use 05/30/2017  . Opiate use 05/30/2017  . Other reduced mobility 05/30/2017  . Disorder of bone, unspecified 05/30/2017  . Positive occult stool blood  test   . Chronic deep vein thrombosis (DVT) of distal vein of right lower extremity ([place]) 08/24/2017  . History of deep vein thrombosis of lower extremity 01/09/2019  . History of transient ischemic attack (TIA) 01/30/2019  . TIA (transient ischemic attack) 12/05/2017  . BMI 50.0-59.9, adult ([place]) 09/20/2019  . Right leg pain 09/18/2019  . Sleep disorder 09/18/2019  . Chronic knee pain (Primary Area of Pain) (Bilateral) (R>L) 10/16/2019  . Chronic knee pain (Right) 10/17/2019   Resolved Ambulatory Problems    Diagnosis Date Noted  . No Resolved Ambulatory Problems   Past Medical History:  Diagnosis  Date  . Allergy   . Anemia   . Asthma   . Benign essential hypertension   . Lumbar spondylosis   . Obesity   . Reflux   . Right leg DVT [facility])    Assessment  Primary Diagnosis & Pertinent Problem List: The primary encounter diagnosis was Chronic knee pain (Right). Diagnoses of Osteoarthritis of knee (Right) and Chronic pain syndrome were also pertinent to this visit.  Visit Diagnosis (New problems to examiner): 1. Chronic knee pain (Right)   2. Osteoarthritis of knee (Right)   3. Chronic pain syndrome    Plan of Care (Initial workup plan)  Note: Lindsay Cooke was reminded that as per protocol, today's visit has been an evaluation only. We have not taken over the patient's controlled substance management.  Problem-specific plan: No problem-specific Assessment & Plan notes found for this encounter.  Lab Orders  No laboratory test(s) ordered today   Imaging Orders  No imaging studies ordered today   Referral Orders  No referral(s) requested today    Procedure Orders     GENICULAR NERVE BLOCK Pharmacotherapy (current): Medications ordered:  No orders of the defined types were placed in this encounter.    Pharmacological management options:  Opioid Analgesics: The patient was informed that there is no guarantee that she would be a candidate for opioid analgesics. The  decision will be made following CDC guidelines. This decision will be based on the results of diagnostic studies, as well as Lindsay Cooke's risk profile.   Membrane stabilizer: To be determined at a later time  Muscle relaxant: To be determined at a later time  NSAID: To be determined at a later time  Other analgesic(s): To be determined at a later time   Interventional management options: Lindsay Cooke was informed that there is no guarantee that she would be a candidate for interventional therapies. The decision will be based on the results of diagnostic studies, as well as Lindsay Cooke's risk profile.  Procedure(s) under consideration:  Diagnostic right genicular NB #1  Diagnostic bilateral knee injections  Possible bilateral knees RFA  Diagnostic bilateral shoulder injection  Diagnostic bilateral suprascapular NB  Possible bilateral suprascapular RFA  Diagnostic thoracic facet block    Provider-requested follow-up: Return for Procedure (w/ sedation): (R) Genicular NB #1.  No future appointments. Total duration of encounter: 25 minutes.  Primary Care Physician: Danelle Berry, NP Note by: Gaspar Cola, MD Date: 10/17/2019; Time: 5:10 PM

## 2019-10-17 ENCOUNTER — Other Ambulatory Visit: Payer: Self-pay

## 2019-10-17 ENCOUNTER — Ambulatory Visit: Payer: Medicare Other | Attending: Pain Medicine | Admitting: Pain Medicine

## 2019-10-17 DIAGNOSIS — G8929 Other chronic pain: Secondary | ICD-10-CM | POA: Insufficient documentation

## 2019-10-17 DIAGNOSIS — G894 Chronic pain syndrome: Secondary | ICD-10-CM | POA: Diagnosis not present

## 2019-10-17 DIAGNOSIS — M1711 Unilateral primary osteoarthritis, right knee: Secondary | ICD-10-CM | POA: Diagnosis not present

## 2019-10-17 DIAGNOSIS — M25561 Pain in right knee: Secondary | ICD-10-CM

## 2019-10-17 NOTE — Patient Instructions (Signed)
____________________________________________________________________________________________  Preparing for Procedure with Sedation  Procedure appointments are limited to planned procedures: . No Prescription Refills. . No disability issues will be discussed. . No medication changes will be discussed.  Instructions: . Oral Intake: Do not eat or drink anything for at least 8 hours prior to your procedure. . Transportation: Public transportation is not allowed. Bring an adult driver. The driver must be physically present in our waiting room before any procedure can be started. . Physical Assistance: Bring an adult physically capable of assisting you, in the event you need help. This adult should keep you company at home for at least 6 hours after the procedure. . Blood Pressure Medicine: Take your blood pressure medicine with a sip of water the morning of the procedure. . Blood thinners: Notify our staff if you are taking any blood thinners. Depending on which one you take, there will be specific instructions on how and when to stop it. . Diabetics on insulin: Notify the staff so that you can be scheduled 1st case in the morning. If your diabetes requires high dose insulin, take only  of your normal insulin dose the morning of the procedure and notify the staff that you have done so. . Preventing infections: Shower with an antibacterial soap the morning of your procedure. . Build-up your immune system: Take 1000 mg of Vitamin C with every meal (3 times a day) the day prior to your procedure. . Antibiotics: Inform the staff if you have a condition or reason that requires you to take antibiotics before dental procedures. . Pregnancy: If you are pregnant, call and cancel the procedure. . Sickness: If you have a cold, fever, or any active infections, call and cancel the procedure. . Arrival: You must be in the facility at least 30 minutes prior to your scheduled procedure. . Children: Do not bring  children with you. . Dress appropriately: Bring dark clothing that you would not mind if they get stained. . Valuables: Do not bring any jewelry or valuables.  Reasons to call and reschedule or cancel your procedure: (Following these recommendations will minimize the risk of a serious complication.) . Surgeries: Avoid having procedures within 2 weeks of any surgery. (Avoid for 2 weeks before or after any surgery). . Flu Shots: Avoid having procedures within 2 weeks of a flu shots or . (Avoid for 2 weeks before or after immunizations). . Barium: Avoid having a procedure within 7-10 days after having had a radiological study involving the use of radiological contrast. (Myelograms, Barium swallow or enema study). . Heart attacks: Avoid any elective procedures or surgeries for the initial 6 months after a "Myocardial Infarction" (Heart Attack). . Blood thinners: It is imperative that you stop these medications before procedures. Let us know if you if you take any blood thinner.  . Infection: Avoid procedures during or within two weeks of an infection (including chest colds or gastrointestinal problems). Symptoms associated with infections include: Localized redness, fever, chills, night sweats or profuse sweating, burning sensation when voiding, cough, congestion, stuffiness, runny nose, sore throat, diarrhea, nausea, vomiting, cold or Flu symptoms, recent or current infections. It is specially important if the infection is over the area that we intend to treat. . Heart and lung problems: Symptoms that may suggest an active cardiopulmonary problem include: cough, chest pain, breathing difficulties or shortness of breath, dizziness, ankle swelling, uncontrolled high or unusually low blood pressure, and/or palpitations. If you are experiencing any of these symptoms, cancel your procedure and contact   your primary care physician for an evaluation.  Remember:  Regular Business hours are:  Monday to Thursday  8:00 AM to 4:00 PM  Provider's Schedule: Milinda Pointer, MD:  Procedure days: Tuesday and Thursday 7:30 AM to 4:00 PM  Gillis Santa, MD:  Procedure days: Monday and Wednesday 7:30 AM to 4:00 PM ____________________________________________________________________________________________    ______________________________________________________________________________________________  Weight Management Required  URGENT: Your weight has been found to be adversely affecting your health.  Dear [name]:  Your current Estimated body mass index is 52.91 kg/m as calculated from the following:   Height as of 01/22/19: 5\' 1"  (1.549 m).   Weight as of 01/22/19: 280 lb (127 kg).  Calculations estimate your ideal body weight to be: Patient weight not recorded  Please use the table below to identify your weight category and associated incidence of chronic pain, secondary to your weight.  Body Mass Index (BMI) Classification BMI level (kg/m2) Category Associated incidence of chronic pain  <18  Underweight   18.5-24.9 Ideal body weight   25-29.9 Overweight  20%  30-34.9 Obese (Class I)  68%  35-39.9 Severe obesity (Class II)  136%  >40 Extreme obesity (Class III)  254%   In addition: You will be considered "Morbidly Obese", if your BMI is above 30 and you have one or more of the following conditions which are known to be directly associated with obesity: 1.    Type 2 Diabetes (Which in turn can lead to cardiovascular diseases (CVD), stroke, peripheral vascular diseases (PVD), retinopathy, nephropathy, and neuropathy) 2.    Cardiovascular Disease (High Blood Pressure; Congestive Heart Failure; High Cholesterol; Coronary Artery Disease; Angina; or History of Heart Attacks) 3.    Breathing problems (Asthma; obesity-hypoventilation syndrome; obstructive sleep apnea; chronic inflammatory airway disease; reactive airway disease; or shortness of breath) 4.    Chronic kidney disease 5.     Liver disease (nonalcoholic fatty liver disease) 6.    High blood pressure 7.    Acid reflux (gastroesophageal reflux disease; heartburn) 8.    Osteoarthritis (OA) (with any of the following: hip pain; knee pain; and/or low back pain) 9.    Low back pain (Lumbar Facet Syndrome; and/or Degenerative Disc Disease) 10.  Hip pain (Osteoarthritis of hip) (For every 1 lbs of added body weight, there is a 2 lbs increase in pressure inside of each hip articulation. 1:2 mechanical relationship) 11.  Knee pain (Osteoarthritis of knee) (For every 1 lbs of added body weight, there is a 4 lbs increase in pressure inside of each knee articulation. 1:4 mechanical relationship) (patients with a BMI>30 kg/m2 were 6.8 times more likely to develop knee OA than normal-weight individuals) 12.  Cancer. Epidemiological studies have shown that obesity is a risk factor for: post-menopausal breast cancer; cancers of the endometrium, colon and kidney cancer; malignant adenomas of the oesophagus. Obese subjects have an approximately 1.5-3.5-fold increased risk of developing these cancers compared with normal-weight subjects, and it has been estimated that between 15 and 45% of these cancers can be attributed to overweight. More recent studies suggest that obesity may also increase the risk of other types of cancer, including pancreatic, hepatic and gallbladder cancer. (Ref: Obesity and cancer. Pischon T, Nthlings U, [employer] H. Proc Nutr Soc. 2008 May;67(2):128-45. doi: N3840775.)  Recommendation: At this point it is urgent that you take a step back and concentrate in loosing weight. Dedicate 100% of your efforts on this task. Nothing else will improve your health more than bringing down your BMI  to less than 30. Because most chronic pain patients do have difficulty exercising secondary to their pain, you must rely on proper nutrition and dieting in order to lose the weight. If your BMI is above 40, you should seriously  consider bariatric surgery. A realistic goal is to lose 10% of your body weight over a period of 12 months.  If over time you have unsuccessfully try to lose weight, then it is time for you to seek professional help and to enter a medically supervised weight management program.  Pain management considerations:  1.    Pharmacological Problems: Be advised that the use of opioid analgesics (oxycodone; hydrocodone; morphine; methadone; codeine; and all of their derivatives) have been associated with decreased metabolism and weight gain.  For this reason, should we see that you are unable to lose weight while taking these medications, it may become necessary for [place] to taper down and indefinitely discontinue them.  2.    Technical Problems: The incidence of successful interventional therapies decreases as the patient's BMI increases. It is much more difficult to accomplish a safe and effective interventional therapy on a patient with a BMI above 35. 3.    Radiation Exposure Problems: The x-rays machine, used to accomplish injection therapies, will automatically increase their x-ray output in order to capture an appropriate bone image. This means that radiation exposure increases exponentially with the patient's BMI. (The higher the BMI, the higher the radiation exposure.) Although the level of radiation used at a given time is still safe to the patient, it is not for the physician and/or assisting staff. Unfortunately, radiation exposure is accumulative. Because physicians and the staff have to do procedures and be exposed on a daily basis, this can result in health problems such as cancer and radiation burns. Radiation exposure to the staff is monitored by the radiation batches that they wear. The exposure levels are reported back to the staff on a quarterly basis. Depending on levels of exposure, physicians and staff may be obligated by law to decrease this exposure. This means that they have the right and  obligation to refuse providing therapies where they may be overexposed to radiation. For this reason, physicians may decline to offer therapies such as radiofrequency ablation or implants to patients with a BMI above 40. 4.    Current Trends: Be advised that the current trend is to no longer offer certain therapies to patients with a BMI equal to, or above 35, due to increase perioperative risks, increased technical procedural difficulties, and excessive radiation exposure to healthcare personnel.  ______________________________________________________________________________________________

## 2019-12-07 ENCOUNTER — Ambulatory Visit: Payer: Medicare Other | Attending: Internal Medicine

## 2019-12-07 DIAGNOSIS — Z23 Encounter for immunization: Secondary | ICD-10-CM

## 2019-12-07 NOTE — Progress Notes (Signed)
   Covid-19 Vaccination Clinic  Name:  Lindsay Cooke    MRN: [id number] DOB: 07/27/1961  12/07/2019  [name] was observed post Covid-19 immunization for 30 minutes based on pre-vaccination screening without incident. She was provided with Vaccine Information Sheet and instruction to access the V-Safe system.   [name] was instructed to call 911 with any severe reactions post vaccine: Marland Kitchen Difficulty breathing  . Swelling of face and throat  . A fast heartbeat  . A bad rash all over body  . Dizziness and weakness   Immunizations Administered    Name Date Dose VIS Date Route   Pfizer COVID-19 Vaccine 12/07/2019  4:00 PM 0.3 mL 08/17/2019 Intramuscular   Manufacturer: [place]   Lot: [id number]   [place]: [id number]

## 2019-12-28 ENCOUNTER — Ambulatory Visit: Payer: Medicare Other | Attending: Internal Medicine

## 2019-12-28 DIAGNOSIS — Z23 Encounter for immunization: Secondary | ICD-10-CM

## 2019-12-28 NOTE — Progress Notes (Signed)
   Covid-19 Vaccination Clinic  Name:  Lindsay Cooke    MRN: [id number] DOB: 09-May-1961  12/28/2019  [name] was observed post Covid-19 immunization for 15 minutes without incident. She was provided with Vaccine Information Sheet and instruction to access the V-Safe system.   [name] was instructed to call 911 with any severe reactions post vaccine: Marland Kitchen Difficulty breathing  . Swelling of face and throat  . A fast heartbeat  . A bad rash all over body  . Dizziness and weakness   Immunizations Administered    Name Date Dose VIS Date Route   Pfizer COVID-19 Vaccine 12/28/2019  3:27 PM 0.3 mL 10/31/2018 Intramuscular   Manufacturer: [employer], [employer]   Lot: [id number]   [place]: [id number]

## 2020-02-01 ENCOUNTER — Other Ambulatory Visit
Admission: RE | Admit: 2020-02-01 | Discharge: 2020-02-01 | Disposition: A | Discharge status: Home / Self Care | Payer: Medicare Other | Source: Ambulatory Visit | Attending: Neurology | Admitting: Neurology

## 2020-02-01 ENCOUNTER — Other Ambulatory Visit: Payer: Self-pay

## 2020-02-01 DIAGNOSIS — Z01812 Encounter for preprocedural laboratory examination: Secondary | ICD-10-CM | POA: Insufficient documentation

## 2020-02-01 DIAGNOSIS — Z20822 Contact with and (suspected) exposure to covid-19: Secondary | ICD-10-CM | POA: Diagnosis not present

## 2020-02-02 LAB — SARS CORONAVIRUS 2 (TAT 6-24 HRS): SARS Coronavirus 2: NEGATIVE

## 2020-02-05 ENCOUNTER — Ambulatory Visit: Payer: Medicare Other | Attending: Neurology

## 2020-02-05 DIAGNOSIS — G4733 Obstructive sleep apnea (adult) (pediatric): Secondary | ICD-10-CM | POA: Diagnosis not present

## 2020-02-06 ENCOUNTER — Other Ambulatory Visit: Payer: Self-pay

## 2020-02-28 ENCOUNTER — Other Ambulatory Visit: Payer: Self-pay

## 2020-02-28 NOTE — Progress Notes (Unsigned)
img 

## 2020-03-07 ENCOUNTER — Ambulatory Visit: Payer: Medicare Other | Admitting: Cardiothoracic Surgery

## 2020-03-21 ENCOUNTER — Ambulatory Visit: Payer: Medicare Other | Admitting: Cardiothoracic Surgery

## 2020-03-25 ENCOUNTER — Other Ambulatory Visit: Payer: Self-pay

## 2020-03-25 DIAGNOSIS — J986 Disorders of diaphragm: Secondary | ICD-10-CM

## 2020-04-04 ENCOUNTER — Ambulatory Visit: Payer: Medicare Other | Admitting: Cardiothoracic Surgery

## 2020-04-10 ENCOUNTER — Ambulatory Visit
Admission: RE | Admit: 2020-04-10 | Discharge: 2020-04-10 | Disposition: A | Discharge status: Home / Self Care | Payer: Medicare Other | Attending: Cardiothoracic Surgery | Admitting: Cardiothoracic Surgery

## 2020-04-10 ENCOUNTER — Ambulatory Visit
Admission: RE | Admit: 2020-04-10 | Discharge: 2020-04-10 | Disposition: A | Discharge status: Home / Self Care | Payer: Medicare Other | Source: Ambulatory Visit | Attending: Cardiothoracic Surgery | Admitting: Cardiothoracic Surgery

## 2020-04-10 DIAGNOSIS — J986 Disorders of diaphragm: Secondary | ICD-10-CM | POA: Diagnosis not present

## 2020-04-11 ENCOUNTER — Encounter: Payer: Self-pay | Admitting: Cardiothoracic Surgery

## 2020-04-11 ENCOUNTER — Ambulatory Visit (INDEPENDENT_AMBULATORY_CARE_PROVIDER_SITE_OTHER): Payer: Medicare Other | Admitting: Cardiothoracic Surgery

## 2020-04-11 ENCOUNTER — Other Ambulatory Visit: Payer: Self-pay

## 2020-04-11 VITALS — BP 140/90 | HR 87 | Temp 98.1°F | Ht 61.0 in | Wt 310.0 lb

## 2020-04-11 DIAGNOSIS — J986 Disorders of diaphragm: Secondary | ICD-10-CM | POA: Diagnosis not present

## 2020-04-11 NOTE — Patient Instructions (Signed)
We have given you information on Weight Loss counseling. Please call this number to get set up the see them and they can help you with this.  We will see you back here in 6 months. We will send you a letter with that appointment date and time.

## 2020-04-11 NOTE — Progress Notes (Signed)
Patient ID: Lindsay Cooke, female   DOB: 05/13/1961, 59 y.o.   MRN: [id number]  Chief Complaint  Patient presents with  . New Patient (Initial Visit)    Referred By Margurite Auerbach Reason for Referral shortness of breath secondary to paralyzed right hemidiaphragm  HPI Location, Quality, Duration, Severity, Timing, Context, Modifying Factors, Associated Signs and Symptoms.  Lindsay Cooke is a 59 y.o. female.  She states that she is developed increasing shortness of breath over the last 5 years.  She has been seeing Dr. Raul Del in the [place] clinic for that.  I see that her chart shows she had a sniff test done back in May of last year showing a paralyzed right hemidiaphragm.  She has had several chest x-rays made over the years and this paralyzed diaphragm appears new over the last 2 years or so.  Interestingly she states that she was short of breath even prior to the diagnosis of a paralyzed right hemidiaphragm.  She has made multiple attempts to lose weight but has been unable to do so.  She states that she has also to have a knee replacement by this has been placed on hold secondary to her morbid obesity.  She states that she is unable to do any of the activities of daily living without getting short of breath.  She is unable to bend over.  Even walking short distances makes her short of breath.  She does not smoke.  She has no history of trauma.  There is been no falls or motor vehicle accidents.  She has had no neck surgery or abdominal surgery.   Past Medical History:  Diagnosis Date  . Allergy   . Anemia   . Asthma   . Benign essential hypertension   . Hyperlipidemia   . Lumbar spondylosis   . Obesity   . Reflux   . Right leg DVT [facility])     Past Surgical History:  Procedure Laterality Date  . ABDOMINAL HYSTERECTOMY    . CHOLECYSTECTOMY    . COLONOSCOPY WITH PROPOFOL N/A 06/16/2017   Procedure: COLONOSCOPY WITH PROPOFOL;  Surgeon: Lin Landsman, MD;  Location: [facility]  ENDOSCOPY;  Service: Gastroenterology;  Laterality: N/A;  . HEEL SPUR EXCISION    . HERNIA REPAIR    . KELOID EXCISION      Family History  Problem Relation Age of Onset  . Breast cancer Sister   . Heart disease Mother   . Congestive Heart Failure Sister     Social History Social History   Tobacco Use  . Smoking status: Never Smoker  . Smokeless tobacco: Never Used  Vaping Use  . Vaping Use: Never used  Substance Use Topics  . Alcohol use: No    Alcohol/week: 0.0 standard drinks  . Drug use: No    Allergies  Allergen Reactions  . Penicillins Hives and Rash    Has patient had a PCN reaction causing immediate rash, facial/tongue/throat swelling, SOB or lightheadedness with hypotension: Yes Has patient had a PCN reaction causing severe rash involving mucus membranes or skin necrosis: No Has patient had a PCN reaction that required hospitalization No Has patient had a PCN reaction occurring within the last 10 years: No If all of the above answers are "NO", then may proceed with Cephalosporin use.    Current Outpatient Medications  Medication Sig Dispense Refill  . albuterol (PROVENTIL HFA;VENTOLIN HFA) 108 (90 Base) MCG/ACT inhaler Inhale 2 puffs into the lungs every 6 (six) hours as needed  for wheezing or shortness of breath. 1 Inhaler 0  . aspirin 81 MG chewable tablet Chew 81 mg by mouth daily.    Marland Kitchen atorvastatin (LIPITOR) 40 MG tablet Take 40 mg by mouth daily.     Marland Kitchen BIOTIN 5000 PO Take by mouth.    . Cholecalciferol (VITAMIN D3 PO) Take by mouth.    . clopidogrel (PLAVIX) 75 MG tablet Take 75 mg by mouth daily.    Marland Kitchen escitalopram (LEXAPRO) 10 MG tablet Take 10 mg by mouth daily.    . ferrous sulfate 325 (65 FE) MG tablet Take 325 mg by mouth daily with breakfast.    . gabapentin (NEURONTIN) 100 MG capsule Take 100 mg by mouth 2 (two) times daily.    Marland Kitchen losartan-hydrochlorothiazide (HYZAAR) 100-25 MG tablet Take 1 tablet by mouth daily.    . montelukast (SINGULAIR) 10 MG  tablet Take 10 mg by mouth daily.     . pantoprazole (PROTONIX) 20 MG tablet Take 20 mg by mouth daily.    Marland Kitchen REXULTI 2 MG TABS tablet Take 2 mg by mouth daily.    Marland Kitchen spironolactone (ALDACTONE) 25 MG tablet Take 25 mg by mouth daily.    . furosemide (LASIX) 20 MG tablet Take 1 tablet (20 mg total) by mouth daily for 5 days. 5 tablet 0   No current facility-administered medications for this visit.      Review of Systems A complete review of systems was asked and was negative except for the following positive findings hearing loss, leg swelling and claudication  Blood pressure 140/90, pulse 87, temperature 98.1 F (36.7 C), height 5\' 1"  (1.549 m), weight (!) 310 lb (140.6 kg).  Physical Exam CONSTITUTIONAL:  Pleasant, well-developed, obese, and in no acute distress. EYES: Pupils equal and reactive to light, Sclera non-icteric EARS, NOSE, MOUTH AND THROAT:  The oropharynx was clear.  Dentition is good repair.  Oral mucosa pink and moist. LYMPH NODES:  Lymph nodes in the neck and axillae were normal RESPIRATORY:  Lungs were clear.  Normal respiratory effort without pathologic use of accessory muscles of respiration CARDIOVASCULAR: Heart was regular without murmurs.  There were no carotid bruits. GI: The abdomen was soft, nontender, and nondistended. There were no palpable masses. There was no hepatosplenomegaly. There were normal bowel sounds in all quadrants. GU:  Rectal deferred.   MUSCULOSKELETAL:  Normal muscle strength and tone.  No clubbing or cyanosis.   SKIN:  There were no pathologic skin lesions.  There were no nodules on palpation.  There are keloids present on the right cheek NEUROLOGIC:  Sensation is normal.  Cranial nerves are grossly intact. PSYCH:  Oriented to person, place and time.  Mood and affect are normal.  Data Reviewed CT and chest x-ray  I have personally reviewed the patient's imaging, laboratory findings and medical records.    Assessment    Paralyzed right  hemidiaphragm with dyspnea    Plan    Present time she states that she has been getting more short of breath over the last year or 2.  She has tried to lose weight has been unable to do so.  Given her BMI of almost 60 I would recommend that he try an attempt at weight reduction prior to that intervention to repair her paralyzed hemidiaphragm.  She understands.  We will go ahead and get referral to our outpatient dietetics department for weight loss.  She will come back and see me in 6 months.  Nestor Lewandowsky, MD 04/11/2020, 10:43 AM

## 2020-06-20 ENCOUNTER — Other Ambulatory Visit: Payer: Self-pay | Admitting: Nurse Practitioner

## 2020-06-20 DIAGNOSIS — Z1231 Encounter for screening mammogram for malignant neoplasm of breast: Secondary | ICD-10-CM

## 2020-07-25 ENCOUNTER — Ambulatory Visit
Admission: RE | Admit: 2020-07-25 | Discharge: 2020-07-25 | Disposition: A | Discharge status: Home / Self Care | Payer: Medicare Other | Source: Ambulatory Visit | Attending: Nurse Practitioner | Admitting: Nurse Practitioner

## 2020-07-25 ENCOUNTER — Other Ambulatory Visit: Payer: Self-pay

## 2020-07-25 DIAGNOSIS — Z1231 Encounter for screening mammogram for malignant neoplasm of breast: Secondary | ICD-10-CM | POA: Diagnosis present

## 2020-10-10 ENCOUNTER — Telehealth (INDEPENDENT_AMBULATORY_CARE_PROVIDER_SITE_OTHER): Payer: Self-pay | Admitting: Gastroenterology

## 2020-10-10 ENCOUNTER — Other Ambulatory Visit: Payer: Self-pay

## 2020-10-10 DIAGNOSIS — Z86010 Personal history of colonic polyps: Secondary | ICD-10-CM

## 2020-10-10 MED ORDER — NA SULFATE-K SULFATE-MG SULF 17.5-3.13-1.6 GM/177ML PO SOLN: 1.0000 | Freq: Once | ORAL | 0 refills | Status: AC

## 2020-10-10 NOTE — Progress Notes (Signed)
Gastroenterology Pre-Procedure Review  Request Date: Monday 11/03/20 Requesting Physician: Dr. Marius Ditch  PATIENT REVIEW QUESTIONS: The patient responded to the following health history questions as indicated:    1. Are you having any GI issues? no 2. Do you have a personal history of Polyps? yes (last colonoscopy was performed by Dr. Marius Ditch 06/16/17.  Colon polyps were present.) 3. Do you have a family history of Colon Cancer or Polyps? no 4. Diabetes Mellitus? no 5. Joint replacements in the past 12 months?no 6. Major health problems in the past 3 months?no 7. Any artificial heart valves, MVP, or defibrillator?no    MEDICATIONS & ALLERGIES:    Patient reports the following regarding taking any anticoagulation/antiplatelet therapy:   Plavix, Coumadin, Eliquis, Xarelto, Lovenox, Pradaxa, Brilinta, or Effient? yes (Plavix prescribed by Dr. Charlynn Grimes. Blood thinner request sent to her office.) Aspirin? yes (81 mg daily)  Patient confirms/reports the following medications:  Current Outpatient Medications  Medication Sig Dispense Refill  . albuterol (PROVENTIL HFA;VENTOLIN HFA) 108 (90 Base) MCG/ACT inhaler Inhale 2 puffs into the lungs every 6 (six) hours as needed for wheezing or shortness of breath. 1 Inhaler 0  . aspirin 81 MG chewable tablet Chew 81 mg by mouth daily.    Marland Kitchen atorvastatin (LIPITOR) 40 MG tablet Take 40 mg by mouth daily.     Marland Kitchen BIOTIN 5000 PO Take by mouth.    . Cholecalciferol (VITAMIN D3 PO) Take by mouth.    . clopidogrel (PLAVIX) 75 MG tablet Take 75 mg by mouth daily.    Marland Kitchen escitalopram (LEXAPRO) 10 MG tablet Take 10 mg by mouth daily.    . ferrous sulfate 325 (65 FE) MG tablet Take 325 mg by mouth daily with breakfast.    . gabapentin (NEURONTIN) 100 MG capsule Take 100 mg by mouth 2 (two) times daily.    Marland Kitchen losartan-hydrochlorothiazide (HYZAAR) 100-25 MG tablet Take 1 tablet by mouth daily.    . montelukast (SINGULAIR) 10 MG tablet Take 10 mg by mouth daily.     . Na  Sulfate-K Sulfate-Mg Sulf 17.5-3.13-1.6 GM/177ML SOLN Take 1 kit by mouth once for 1 dose. 354 mL 0  . pantoprazole (PROTONIX) 20 MG tablet Take 20 mg by mouth daily.    Marland Kitchen REXULTI 2 MG TABS tablet Take 2 mg by mouth daily.    Marland Kitchen spironolactone (ALDACTONE) 25 MG tablet Take 25 mg by mouth daily.    . furosemide (LASIX) 20 MG tablet Take 1 tablet (20 mg total) by mouth daily for 5 days. 5 tablet 0   No current facility-administered medications for this visit.    Patient confirms/reports the following allergies:  Allergies  Allergen Reactions  . Penicillins Hives and Rash    Has patient had a PCN reaction causing immediate rash, facial/tongue/throat swelling, SOB or lightheadedness with hypotension: Yes Has patient had a PCN reaction causing severe rash involving mucus membranes or skin necrosis: No Has patient had a PCN reaction that required hospitalization No Has patient had a PCN reaction occurring within the last 10 years: No If all of the above answers are "NO", then may proceed with Cephalosporin use.    No orders of the defined types were placed in this encounter.   AUTHORIZATION INFORMATION Primary Insurance: 1D#: Group #:  Secondary Insurance: 1D#: Group #:  SCHEDULE INFORMATION: Date: 11/03/20 Time: Location:[facility]

## 2020-10-15 ENCOUNTER — Encounter: Payer: Self-pay | Admitting: Nurse Practitioner

## 2020-10-20 ENCOUNTER — Telehealth: Payer: Self-pay | Admitting: Gastroenterology

## 2020-10-20 NOTE — Telephone Encounter (Signed)
Patient wants to move procedure to 11/10/2020 and COVID test on 11/06/2020. Updated referral and informed Magda Paganini in ENDO

## 2020-10-20 NOTE — Telephone Encounter (Signed)
Please call patient to reschedule procedure

## 2020-11-04 ENCOUNTER — Telehealth: Payer: Self-pay

## 2020-11-04 NOTE — Telephone Encounter (Signed)
Spoke with patient regarding referral to [facility]- I let her know Dr.Oaks is still out on Medical Leave and not sure when he will be back- Dr.Oaks last office visit stated he wanted to see her back in 6 months February 2022- patient states she rides the bus to and from all appointments and that she will call them today and see if they will take her to [facility]- she will call me back today to let me know- if she has transportation then referral will be sent to [facility].

## 2020-11-05 ENCOUNTER — Telehealth: Payer: Self-pay | Admitting: Cardiothoracic Surgery

## 2020-11-05 ENCOUNTER — Other Ambulatory Visit: Payer: Self-pay

## 2020-11-05 ENCOUNTER — Telehealth: Payer: Self-pay | Admitting: Gastroenterology

## 2020-11-05 DIAGNOSIS — J986 Disorders of diaphragm: Secondary | ICD-10-CM

## 2020-11-05 NOTE — Telephone Encounter (Signed)
Patient has questions regarding what medications she is to stop taking before her procedure.

## 2020-11-05 NOTE — Progress Notes (Signed)
Spoke with Delsa Sale at [facility] to let her know I have faxed over the referral notes and insurance information-also let her know patient relies on transportation bus so please call patient with appointment as soon as possible so she can make arrangements for transportation in advance.   Patient notified of [facility] referral and will call next week if no appointment has been made by then.

## 2020-11-05 NOTE — Telephone Encounter (Signed)
Patient was calling to find out when to stop the vitamins. Informed patient 5 days before procedure. She states they told her to stop blood thinner 2 days prior to procedure.

## 2020-11-05 NOTE — Telephone Encounter (Signed)
Patient states that she called [facility] in [place]. They would not make the appointment with her.  She was told by them that our office would need to call.  Patient seemed very confuse.  Please call her.  Thank you.

## 2020-11-06 ENCOUNTER — Other Ambulatory Visit
Admission: RE | Admit: 2020-11-06 | Discharge: 2020-11-06 | Disposition: A | Discharge status: Home / Self Care | Payer: Medicare Other | Source: Ambulatory Visit | Attending: Gastroenterology | Admitting: Gastroenterology

## 2020-11-06 ENCOUNTER — Other Ambulatory Visit: Payer: Self-pay

## 2020-11-06 DIAGNOSIS — Z20822 Contact with and (suspected) exposure to covid-19: Secondary | ICD-10-CM | POA: Insufficient documentation

## 2020-11-06 DIAGNOSIS — Z01812 Encounter for preprocedural laboratory examination: Secondary | ICD-10-CM | POA: Diagnosis present

## 2020-11-06 LAB — SARS CORONAVIRUS 2 (TAT 6-24 HRS): SARS Coronavirus 2: NEGATIVE

## 2020-11-07 ENCOUNTER — Encounter: Payer: Self-pay | Admitting: Gastroenterology

## 2020-11-10 ENCOUNTER — Ambulatory Visit: Payer: Medicare Other | Admitting: Certified Registered Nurse Anesthetist

## 2020-11-10 ENCOUNTER — Encounter: Payer: Self-pay | Admitting: Gastroenterology

## 2020-11-10 ENCOUNTER — Encounter
Admission: RE | Disposition: A | Discharge status: Home / Self Care | Payer: Self-pay | Source: Home / Self Care | Attending: Gastroenterology

## 2020-11-10 ENCOUNTER — Other Ambulatory Visit: Payer: Self-pay

## 2020-11-10 ENCOUNTER — Ambulatory Visit
Admission: RE | Admit: 2020-11-10 | Discharge: 2020-11-10 | Disposition: A | Discharge status: Home / Self Care | Payer: Medicare Other | Attending: Gastroenterology | Admitting: Gastroenterology

## 2020-11-10 DIAGNOSIS — K635 Polyp of colon: Secondary | ICD-10-CM | POA: Diagnosis not present

## 2020-11-10 DIAGNOSIS — Z86718 Personal history of other venous thrombosis and embolism: Secondary | ICD-10-CM | POA: Insufficient documentation

## 2020-11-10 DIAGNOSIS — Z1211 Encounter for screening for malignant neoplasm of colon: Secondary | ICD-10-CM | POA: Diagnosis not present

## 2020-11-10 DIAGNOSIS — Z880 Allergy status to penicillin: Secondary | ICD-10-CM | POA: Insufficient documentation

## 2020-11-10 DIAGNOSIS — Z7902 Long term (current) use of antithrombotics/antiplatelets: Secondary | ICD-10-CM | POA: Diagnosis not present

## 2020-11-10 DIAGNOSIS — Z79899 Other long term (current) drug therapy: Secondary | ICD-10-CM | POA: Diagnosis not present

## 2020-11-10 DIAGNOSIS — Z7982 Long term (current) use of aspirin: Secondary | ICD-10-CM | POA: Insufficient documentation

## 2020-11-10 DIAGNOSIS — Z86010 Personal history of colonic polyps: Secondary | ICD-10-CM | POA: Insufficient documentation

## 2020-11-10 HISTORY — PX: COLONOSCOPY WITH PROPOFOL: SHX5780

## 2020-11-10 HISTORY — DX: Cerebral infarction, unspecified: I63.9

## 2020-11-10 HISTORY — DX: Gastro-esophageal reflux disease without esophagitis: K21.9

## 2020-11-10 SURGERY — COLONOSCOPY WITH PROPOFOL
Anesthesia: General

## 2020-11-10 MED ORDER — PROPOFOL 10 MG/ML IV BOLUS
INTRAVENOUS | Status: DC | PRN
  Administered 2020-11-10: 20 mg via INTRAVENOUS
  Administered 2020-11-10: 60 mg via INTRAVENOUS
  Administered 2020-11-10: 20 mg via INTRAVENOUS

## 2020-11-10 MED ORDER — LIDOCAINE HCL (CARDIAC) PF 100 MG/5ML IV SOSY
PREFILLED_SYRINGE | INTRAVENOUS | Status: DC | PRN
  Administered 2020-11-10: 50 mg via INTRAVENOUS

## 2020-11-10 MED ORDER — SODIUM CHLORIDE 0.9 % IV SOLN: INTRAVENOUS | Status: DC

## 2020-11-10 MED ORDER — PROPOFOL 500 MG/50ML IV EMUL
INTRAVENOUS | Status: DC | PRN
  Administered 2020-11-10: 150 ug/kg/min via INTRAVENOUS

## 2020-11-10 NOTE — Op Note (Signed)
[facility] Gastroenterology Patient Name: Lindsay Cooke Procedure Date: 11/10/2020 9:47 AM MRN: [id number] Account #: [id number] Date of Birth: June 12, 1961 Admit Type: Outpatient Age: 60 Room: [facility] ENDO ROOM 1 Gender: Female Note Status: Finalized Procedure:             Colonoscopy Indications:           Surveillance: Personal history of adenomatous polyps                         on last colonoscopy > 3 years ago, Last colonoscopy:                         October 2018 Providers:             Lin Landsman MD, MD Medicines:             General Anesthesia Complications:         No immediate complications. Estimated blood loss: None. Procedure:             Pre-Anesthesia Assessment:                        - Prior to the procedure, a History and Physical was                         performed, and patient medications and allergies were                         reviewed. The patient is competent. The risks and                         benefits of the procedure and the sedation options and                         risks were discussed with the patient. All questions                         were answered and informed consent was obtained.                         Patient identification and proposed procedure were                         verified by the physician, the nurse, the                         anesthesiologist, the anesthetist and the technician                         in the pre-procedure area in the procedure room in the                         endoscopy suite. Mental Status Examination: alert and                         oriented. Airway Examination: normal oropharyngeal                         airway and neck mobility. Respiratory Examination:  clear to auscultation. CV Examination: normal.                         Prophylactic Antibiotics: The patient does not require                         prophylactic antibiotics. Prior Anticoagulants:  The                         patient has taken no previous anticoagulant or                         antiplatelet agents. ASA Grade Assessment: III - A                         patient with severe systemic disease. After reviewing                         the risks and benefits, the patient was deemed in                         satisfactory condition to undergo the procedure. The                         anesthesia plan was to use general anesthesia.                         Immediately prior to administration of medications,                         the patient was re-assessed for adequacy to receive                         sedatives. The heart rate, respiratory rate, oxygen                         saturations, blood pressure, adequacy of pulmonary                         ventilation, and response to care were monitored                         throughout the procedure. The physical status of the                         patient was re-assessed after the procedure.                        After obtaining informed consent, the colonoscope was                         passed under direct vision. Throughout the procedure,                         the patient's blood pressure, pulse, and oxygen                         saturations were monitored continuously. The  Colonoscope was introduced through the anus and                         advanced to the the cecum, identified by appendiceal                         orifice and ileocecal valve. The colonoscopy was                         performed without difficulty. The patient tolerated                         the procedure well. The quality of the bowel                         preparation was evaluated using the BBPS [facility] Bowel                         Preparation Scale) with scores of: Right Colon = 3,                         Transverse Colon = 3 and Left Colon = 3 (entire mucosa                         seen well with no residual  staining, small fragments                         of stool or opaque liquid). The total BBPS score                         equals 9. Findings:      The perianal and digital rectal examinations were normal. Pertinent       negatives include normal sphincter tone and no palpable rectal lesions.      Two sessile polyps were found in the transverse colon and ascending       colon. The polyps were diminutive in size. These polyps were removed       with a cold biopsy forceps. Resection and retrieval were complete.      The retroflexed view of the distal rectum and anal verge was normal and       showed no anal or rectal abnormalities.      The exam was otherwise without abnormality. Impression:            - Two diminutive polyps in the transverse colon and in                         the ascending colon, removed with a cold biopsy                         forceps. Resected and retrieved.                        - The distal rectum and anal verge are normal on                         retroflexion view.                        -  The examination was otherwise normal. Recommendation:        - Discharge patient to home (with escort).                        - Resume previous diet today.                        - Continue present medications.                        - Await pathology results.                        - Repeat colonoscopy in 5 years for surveillance. Procedure Code(s):     --- Professional ---                        [phone], Colonoscopy, flexible; with biopsy, single or                         multiple Diagnosis Code(s):     --- Professional ---                        Z86.010, Personal history of colonic polyps                        K63.5, Polyp of colon CPT copyright 2019 American Medical Association. All rights reserved. The codes documented in this report are preliminary and upon coder review may  be revised to meet current compliance requirements. Dr. Ulyess Mort Lin Landsman  MD, MD 11/10/2020 10:27:41 AM This report has been signed electronically. Number of Addenda: 0 Note Initiated On: 11/10/2020 9:47 AM Scope Withdrawal Time: 0 hours 12 minutes 30 seconds  Total Procedure Duration: 0 hours 15 minutes 10 seconds  Estimated Blood Loss:  Estimated blood loss: none.      [facility]

## 2020-11-10 NOTE — H&P (Signed)
Cephas Darby, MD [address]  [place]  [place], [place] [address]  Main: [phone]  Fax: [phone] Pager: [phone]  Primary Care Physician:  Danelle Berry, NP Primary Gastroenterologist:  Dr. Cephas Darby  Pre-Procedure History & Physical: HPI:  Lindsay Cooke is a 60 y.o. female is here for an colonoscopy.   Past Medical History:  Diagnosis Date  . Allergy   . Anemia   . Asthma   . Benign essential hypertension   . GERD (gastroesophageal reflux disease)   . Hyperlipidemia   . Lumbar spondylosis   . Obesity   . Reflux   . Right leg DVT ([place])   . Stroke [facility])     Past Surgical History:  Procedure Laterality Date  . ABDOMINAL HYSTERECTOMY    . CHOLECYSTECTOMY    . COLONOSCOPY WITH PROPOFOL N/A 06/16/2017   Procedure: COLONOSCOPY WITH PROPOFOL;  Surgeon: Lin Landsman, MD;  Location: [facility] ENDOSCOPY;  Service: Gastroenterology;  Laterality: N/A;  . HEEL SPUR EXCISION    . HERNIA REPAIR    . KELOID EXCISION      Prior to Admission medications   Medication Sig Start Date End Date Taking? Authorizing Provider  aspirin 81 MG chewable tablet Chew 81 mg by mouth daily.   Yes [provider]  atorvastatin (LIPITOR) 40 MG tablet Take 40 mg by mouth daily.  04/11/17  Yes [provider]  BIOTIN 5000 PO Take by mouth.   Yes [provider]  Cholecalciferol (VITAMIN D3 PO) Take by mouth.   Yes [provider]  escitalopram (LEXAPRO) 10 MG tablet Take 10 mg by mouth daily. 03/19/20  Yes [provider]  ferrous sulfate 325 (65 FE) MG tablet Take 325 mg by mouth daily with breakfast.   Yes [provider]  gabapentin (NEURONTIN) 100 MG capsule Take 100 mg by mouth 2 (two) times daily.   Yes [provider]  losartan-hydrochlorothiazide (HYZAAR) 100-25 MG tablet Take 1 tablet by mouth daily. 04/25/15  Yes [provider]  montelukast (SINGULAIR) 10 MG tablet Take 10 mg by mouth daily.     Yes [provider]  pantoprazole (PROTONIX) 20 MG tablet Take 20 mg by mouth daily. 03/19/20  Yes [provider]  REXULTI 2 MG TABS tablet Take 2 mg by mouth daily. 03/26/20  Yes [provider]  spironolactone (ALDACTONE) 25 MG tablet Take 25 mg by mouth daily. 04/03/20  Yes [provider]  albuterol (PROVENTIL HFA;VENTOLIN HFA) 108 (90 Base) MCG/ACT inhaler Inhale 2 puffs into the lungs every 6 (six) hours as needed for wheezing or shortness of breath. 06/30/16   Orbie Pyo, MD  clopidogrel (PLAVIX) 75 MG tablet Take 75 mg by mouth daily.    [provider]  furosemide (LASIX) 20 MG tablet Take 1 tablet (20 mg total) by mouth daily for 5 days. 01/22/19 01/27/19  Nance Pear, MD    Allergies as of 10/10/2020 - Review Complete 10/10/2020  Allergen Reaction Noted  . Penicillins Hives and Rash 02/22/2015    Family History  Problem Relation Age of Onset  . Breast cancer Sister   . Heart disease Mother   . Congestive Heart Failure Sister     Social History   Socioeconomic History  . Marital status: Single    Spouse name: Not on file  . Number of children: Not on file  . Years of education: Not on file  . Highest education level: Not on file  Occupational History  . Not on file  Tobacco Use  . Smoking status: Never Smoker  . Smokeless tobacco: Never Used  Vaping Use  . Vaping Use: Never used  Substance and Sexual Activity  . Alcohol use: No    Alcohol/week: 0.0 standard drinks  . Drug use: No  . Sexual activity: Never  Other Topics Concern  . Not on file  Social History Narrative  . Not on file   Social Determinants of Health   Financial Resource Strain: Not on file  Food Insecurity: Not on file  Transportation Needs: Not on file  Physical Activity: Not on file  Stress: Not on file  Social Connections: Not on file  Intimate Partner Violence: Not on file    Review of Systems: See HPI, otherwise  negative ROS  Physical Exam: BP 122/64   Pulse 76   Temp (!) 96.3 F (35.7 C) (Temporal)   Resp (!) 22   Ht 5\' 1"  (1.549 m)   Wt 113.4 kg   SpO2 100%   BMI 47.24 kg/m  General:   Alert,  pleasant and cooperative in NAD Head:  Normocephalic and atraumatic. Neck:  Supple; no masses or thyromegaly. Lungs:  Clear throughout to auscultation.    Heart:  Regular rate and rhythm. Abdomen:  Soft, nontender and nondistended. Normal bowel sounds, without guarding, and without rebound.   Neurologic:  Alert and  oriented x4;  grossly normal neurologically.  Impression/Plan: Lindsay Cooke is here for an colonoscopy to be performed for h/o colon polyps  Risks, benefits, limitations, and alternatives regarding  colonoscopy have been reviewed with the patient.  Questions have been answered.  All parties agreeable.   Sherri Sear, MD  11/10/2020, 9:45 AM

## 2020-11-10 NOTE — Anesthesia Preprocedure Evaluation (Addendum)
Anesthesia Evaluation  Patient identified by MRN, date of birth, ID band Patient awake    Reviewed: Allergy & Precautions, H&P , NPO status , Patient's Chart, lab work & pertinent test results  History of Anesthesia Complications Negative for: history of anesthetic complications  Airway Mallampati: II  TM Distance: >3 FB     Dental  (+) Chipped   Pulmonary asthma , sleep apnea , neg COPD,    breath sounds clear to auscultation       Cardiovascular hypertension, (-) angina+ CAD  (-) Past MI and (-) Cardiac Stents (-) dysrhythmias  Rhythm:regular Rate:Normal     Neuro/Psych  Headaches, PSYCHIATRIC DISORDERS Depression Schizophrenia Chronic pain CVA    GI/Hepatic Neg liver ROS, GERD  Controlled,  Endo/Other  Morbid obesity  Renal/GU negative Renal ROS  negative genitourinary   Musculoskeletal   Abdominal   Peds  Hematology negative hematology ROS (+)   Anesthesia Other Findings Past Medical History: No date: Allergy No date: Anemia No date: Asthma No date: Benign essential hypertension No date: Hyperlipidemia No date: Lumbar spondylosis No date: Obesity No date: Reflux No date: Right leg DVT ([facility])  Past Surgical History: No date: ABDOMINAL HYSTERECTOMY No date: CHOLECYSTECTOMY 06/16/2017: COLONOSCOPY WITH PROPOFOL; N/A     Comment:  Procedure: COLONOSCOPY WITH PROPOFOL;  Surgeon: Lin Landsman, MD;  Location: [facility] ENDOSCOPY;  Service:               Gastroenterology;  Laterality: N/A; No date: HEEL SPUR EXCISION No date: HERNIA REPAIR No date: KELOID EXCISION     Reproductive/Obstetrics negative OB ROS                            Anesthesia Physical Anesthesia Plan  ASA: III  Anesthesia Plan: General   Post-op Pain Management:    Induction:   PONV Risk Score and Plan: Propofol infusion and TIVA  Airway Management Planned:   Additional Equipment:    Intra-op Plan:   Post-operative Plan:   Informed Consent: I have reviewed the patients History and Physical, chart, labs and discussed the procedure including the risks, benefits and alternatives for the proposed anesthesia with the patient or authorized representative who has indicated his/her understanding and acceptance.     Dental Advisory Given  Plan Discussed with: Anesthesiologist, CRNA and Surgeon  Anesthesia Plan Comments:         Anesthesia Quick Evaluation

## 2020-11-10 NOTE — Transfer of Care (Signed)
Immediate Anesthesia Transfer of Care Note  Patient: Lindsay Cooke  Procedure(s) Performed: COLONOSCOPY WITH PROPOFOL (N/A )  Patient Location: PACU  Anesthesia Type:General  Level of Consciousness: drowsy  Airway & Oxygen Therapy: Patient Spontanous Breathing  Post-op Assessment: Report given to RN and Post -op Vital signs reviewed and stable  Post vital signs: Reviewed and stable  Last Vitals:  Vitals Value Taken Time  BP 112/73 11/10/20 1029  Temp    Pulse 73 11/10/20 1029  Resp 25 11/10/20 1029  SpO2 100 % 11/10/20 1029    Last Pain:  Vitals:   11/10/20 1029  TempSrc:   PainSc: 0-No pain         Complications: No complications documented.

## 2020-11-11 ENCOUNTER — Encounter: Payer: Self-pay | Admitting: Gastroenterology

## 2020-11-11 ENCOUNTER — Other Ambulatory Visit: Payer: Self-pay | Admitting: *Deleted

## 2020-11-11 DIAGNOSIS — J986 Disorders of diaphragm: Secondary | ICD-10-CM

## 2020-11-11 DIAGNOSIS — R071 Chest pain on breathing: Secondary | ICD-10-CM

## 2020-11-11 DIAGNOSIS — R0602 Shortness of breath: Secondary | ICD-10-CM

## 2020-11-11 LAB — SURGICAL PATHOLOGY

## 2020-11-11 NOTE — Anesthesia Postprocedure Evaluation (Signed)
Anesthesia Post Note  Patient: Lindsay Cooke  Procedure(s) Performed: COLONOSCOPY WITH PROPOFOL (N/A )  Patient location during evaluation: PACU Anesthesia Type: General Level of consciousness: awake and alert Pain management: pain level controlled Vital Signs Assessment: post-procedure vital signs reviewed and stable Respiratory status: spontaneous breathing, nonlabored ventilation and respiratory function stable Cardiovascular status: blood pressure returned to baseline and stable Postop Assessment: no apparent nausea or vomiting Anesthetic complications: no   No complications documented.   Last Vitals:  Vitals:   11/10/20 1039 11/10/20 1049  BP: 131/75 134/82  Pulse: 76 67  Resp: 18 20  Temp:    SpO2: 100% 100%    Last Pain:  Vitals:   11/10/20 1049  TempSrc:   PainSc: 0-No pain                 Brett Canales Shaft Corigliano

## 2020-11-11 NOTE — Progress Notes (Unsigned)
Dg sni

## 2020-11-24 ENCOUNTER — Other Ambulatory Visit: Admission: RE | Admit: 2020-11-24 | Payer: Medicare Other | Source: Ambulatory Visit

## 2020-11-26 ENCOUNTER — Inpatient Hospital Stay: Admission: RE | Admit: 2020-11-26 | Payer: Medicare Other | Source: Ambulatory Visit

## 2020-11-26 ENCOUNTER — Inpatient Hospital Stay (HOSPITAL_COMMUNITY): Admission: RE | Admit: 2020-11-26 | Payer: Medicare Other | Source: Ambulatory Visit

## 2020-11-26 ENCOUNTER — Other Ambulatory Visit: Payer: Medicare Other

## 2020-12-05 ENCOUNTER — Encounter: Payer: Medicare Other | Admitting: Thoracic Surgery (Cardiothoracic Vascular Surgery)

## 2020-12-12 DIAGNOSIS — J449 Chronic obstructive pulmonary disease, unspecified: Secondary | ICD-10-CM | POA: Diagnosis not present

## 2020-12-12 DIAGNOSIS — J45998 Other asthma: Secondary | ICD-10-CM | POA: Diagnosis not present

## 2020-12-16 DIAGNOSIS — J449 Chronic obstructive pulmonary disease, unspecified: Secondary | ICD-10-CM | POA: Diagnosis not present

## 2020-12-16 DIAGNOSIS — J45998 Other asthma: Secondary | ICD-10-CM | POA: Diagnosis not present

## 2021-01-09 ENCOUNTER — Encounter: Payer: Medicare Other | Admitting: Thoracic Surgery (Cardiothoracic Vascular Surgery)

## 2021-01-09 DIAGNOSIS — E118 Type 2 diabetes mellitus with unspecified complications: Secondary | ICD-10-CM | POA: Diagnosis not present

## 2021-01-09 DIAGNOSIS — I10 Essential (primary) hypertension: Secondary | ICD-10-CM | POA: Diagnosis not present

## 2021-01-09 DIAGNOSIS — R7303 Prediabetes: Secondary | ICD-10-CM | POA: Diagnosis not present

## 2021-01-14 DIAGNOSIS — D519 Vitamin B12 deficiency anemia, unspecified: Secondary | ICD-10-CM | POA: Diagnosis not present

## 2021-01-14 DIAGNOSIS — G4733 Obstructive sleep apnea (adult) (pediatric): Secondary | ICD-10-CM | POA: Diagnosis not present

## 2021-01-14 DIAGNOSIS — I10 Essential (primary) hypertension: Secondary | ICD-10-CM | POA: Diagnosis not present

## 2021-01-14 DIAGNOSIS — R7303 Prediabetes: Secondary | ICD-10-CM | POA: Diagnosis not present

## 2021-01-14 DIAGNOSIS — E782 Mixed hyperlipidemia: Secondary | ICD-10-CM | POA: Diagnosis not present

## 2021-01-15 DIAGNOSIS — J45998 Other asthma: Secondary | ICD-10-CM | POA: Diagnosis not present

## 2021-01-15 DIAGNOSIS — J449 Chronic obstructive pulmonary disease, unspecified: Secondary | ICD-10-CM | POA: Diagnosis not present

## 2021-01-20 ENCOUNTER — Other Ambulatory Visit: Payer: Self-pay | Admitting: Physician Assistant

## 2021-01-20 ENCOUNTER — Other Ambulatory Visit (HOSPITAL_COMMUNITY): Payer: Self-pay | Admitting: Physician Assistant

## 2021-01-20 DIAGNOSIS — M79661 Pain in right lower leg: Secondary | ICD-10-CM

## 2021-01-20 DIAGNOSIS — Z8673 Personal history of transient ischemic attack (TIA), and cerebral infarction without residual deficits: Secondary | ICD-10-CM | POA: Diagnosis not present

## 2021-01-21 ENCOUNTER — Ambulatory Visit: Payer: Medicare Other

## 2021-01-29 DIAGNOSIS — G4733 Obstructive sleep apnea (adult) (pediatric): Secondary | ICD-10-CM | POA: Diagnosis not present

## 2021-02-18 DIAGNOSIS — J45998 Other asthma: Secondary | ICD-10-CM | POA: Diagnosis not present

## 2021-02-18 DIAGNOSIS — J449 Chronic obstructive pulmonary disease, unspecified: Secondary | ICD-10-CM | POA: Diagnosis not present

## 2021-02-19 ENCOUNTER — Ambulatory Visit: Payer: Medicare Other

## 2021-02-26 DIAGNOSIS — G4733 Obstructive sleep apnea (adult) (pediatric): Secondary | ICD-10-CM | POA: Diagnosis not present

## 2021-03-17 DIAGNOSIS — J449 Chronic obstructive pulmonary disease, unspecified: Secondary | ICD-10-CM | POA: Diagnosis not present

## 2021-03-17 DIAGNOSIS — J45998 Other asthma: Secondary | ICD-10-CM | POA: Diagnosis not present

## 2021-03-31 ENCOUNTER — Telehealth: Payer: Self-pay | Admitting: *Deleted

## 2021-03-31 NOTE — Telephone Encounter (Signed)
left vm for patient to return call to our office to r/s her appointment with Dr Kipp Brood

## 2021-05-02 DIAGNOSIS — G4733 Obstructive sleep apnea (adult) (pediatric): Secondary | ICD-10-CM | POA: Diagnosis not present

## 2021-05-21 DIAGNOSIS — D519 Vitamin B12 deficiency anemia, unspecified: Secondary | ICD-10-CM | POA: Diagnosis not present

## 2021-05-21 DIAGNOSIS — R7303 Prediabetes: Secondary | ICD-10-CM | POA: Diagnosis not present

## 2021-05-21 DIAGNOSIS — I10 Essential (primary) hypertension: Secondary | ICD-10-CM | POA: Diagnosis not present

## 2021-05-21 DIAGNOSIS — E782 Mixed hyperlipidemia: Secondary | ICD-10-CM | POA: Diagnosis not present

## 2021-05-25 ENCOUNTER — Other Ambulatory Visit: Payer: Self-pay | Admitting: Nurse Practitioner

## 2021-05-25 DIAGNOSIS — I10 Essential (primary) hypertension: Secondary | ICD-10-CM | POA: Diagnosis not present

## 2021-05-25 DIAGNOSIS — M1990 Unspecified osteoarthritis, unspecified site: Secondary | ICD-10-CM | POA: Diagnosis not present

## 2021-05-25 DIAGNOSIS — Z1231 Encounter for screening mammogram for malignant neoplasm of breast: Secondary | ICD-10-CM

## 2021-05-25 DIAGNOSIS — G4733 Obstructive sleep apnea (adult) (pediatric): Secondary | ICD-10-CM | POA: Diagnosis not present

## 2021-05-25 DIAGNOSIS — R7303 Prediabetes: Secondary | ICD-10-CM | POA: Diagnosis not present

## 2021-06-23 DIAGNOSIS — G4733 Obstructive sleep apnea (adult) (pediatric): Secondary | ICD-10-CM | POA: Diagnosis not present

## 2021-06-24 ENCOUNTER — Encounter (INDEPENDENT_AMBULATORY_CARE_PROVIDER_SITE_OTHER): Payer: Self-pay

## 2021-06-24 ENCOUNTER — Encounter: Payer: Self-pay | Admitting: Podiatry

## 2021-06-24 ENCOUNTER — Other Ambulatory Visit: Payer: Self-pay

## 2021-06-24 ENCOUNTER — Ambulatory Visit (INDEPENDENT_AMBULATORY_CARE_PROVIDER_SITE_OTHER): Payer: Medicare Other | Admitting: Podiatry

## 2021-06-24 ENCOUNTER — Ambulatory Visit (INDEPENDENT_AMBULATORY_CARE_PROVIDER_SITE_OTHER): Payer: Medicare Other

## 2021-06-24 ENCOUNTER — Other Ambulatory Visit: Payer: Self-pay | Admitting: Podiatry

## 2021-06-24 DIAGNOSIS — M7662 Achilles tendinitis, left leg: Secondary | ICD-10-CM

## 2021-06-24 DIAGNOSIS — M722 Plantar fascial fibromatosis: Secondary | ICD-10-CM

## 2021-06-24 DIAGNOSIS — M729 Fibroblastic disorder, unspecified: Secondary | ICD-10-CM

## 2021-06-24 MED ORDER — METHYLPREDNISOLONE 4 MG PO TBPK: ORAL_TABLET | ORAL | 0 refills | Status: DC

## 2021-06-24 MED ORDER — TRIAMCINOLONE ACETONIDE 40 MG/ML IJ SUSP
20.0000 mg | Freq: Once | INTRAMUSCULAR | Status: AC
  Administered 2021-06-24: 20 mg

## 2021-06-24 MED ORDER — DEXAMETHASONE SODIUM PHOSPHATE 120 MG/30ML IJ SOLN
2.0000 mg | Freq: Once | INTRAMUSCULAR | Status: AC
  Administered 2021-06-24: 2 mg via INTRA_ARTICULAR

## 2021-06-24 NOTE — Progress Notes (Signed)
Subjective:  Patient ID: Lindsay Cooke, female    DOB: 03-16-1961,  MRN: [id number] HPI Chief Complaint  Patient presents with   Foot Pain    Plantar/posterior heel left - aching x 2 months, AM pain, tried Tylenol-no help   New Patient (Initial Visit)    Est pt 35    60 y.o. female presents with the above complaint.   ROS: Denies fever chills nausea vomiting muscle aches pains calf pain back pain chest pain shortness of breath.  Past Medical History:  Diagnosis Date   Allergy    Anemia    Asthma    Benign essential hypertension    GERD (gastroesophageal reflux disease)    Hyperlipidemia    Lumbar spondylosis    Obesity    Reflux    Right leg DVT ([place])    Stroke [facility])    Past Surgical History:  Procedure Laterality Date   ABDOMINAL HYSTERECTOMY     CHOLECYSTECTOMY     COLONOSCOPY WITH PROPOFOL N/A 06/16/2017   Procedure: COLONOSCOPY WITH PROPOFOL;  Surgeon: Lin Landsman, MD;  Location: [facility] ENDOSCOPY;  Service: Gastroenterology;  Laterality: N/A;   COLONOSCOPY WITH PROPOFOL N/A 11/10/2020   Procedure: COLONOSCOPY WITH PROPOFOL;  Surgeon: Lin Landsman, MD;  Location: [facility] ENDOSCOPY;  Service: Gastroenterology;  Laterality: N/A;   HEEL SPUR EXCISION     HERNIA REPAIR     KELOID EXCISION      Current Outpatient Medications:    methylPREDNISolone (MEDROL DOSEPAK) 4 MG TBPK tablet, 6 day dose pack - take as directed, Disp: 21 tablet, Rfl: 0   montelukast (SINGULAIR) 10 MG tablet, Take 1 tablet by mouth daily., Disp: , Rfl:    albuterol (PROVENTIL HFA;VENTOLIN HFA) 108 (90 Base) MCG/ACT inhaler, Inhale 2 puffs into the lungs every 6 (six) hours as needed for wheezing or shortness of breath., Disp: 1 Inhaler, Rfl: 0   aspirin 81 MG chewable tablet, Chew 81 mg by mouth daily., Disp: , Rfl:    atorvastatin (LIPITOR) 20 MG tablet, Take 20 mg by mouth daily., Disp: , Rfl:    BIOTIN 5000 PO, Take by mouth., Disp: , Rfl:    Cholecalciferol (VITAMIN D3 PO), Take by  mouth., Disp: , Rfl:    clopidogrel (PLAVIX) 75 MG tablet, Take 75 mg by mouth daily., Disp: , Rfl:    escitalopram (LEXAPRO) 10 MG tablet, Take 10 mg by mouth daily., Disp: , Rfl:    Eszopiclone 3 MG TABS, Take 3 mg by mouth at bedtime as needed., Disp: , Rfl:    ferrous sulfate 325 (65 FE) MG tablet, Take 325 mg by mouth daily with breakfast., Disp: , Rfl:    furosemide (LASIX) 20 MG tablet, Take 1 tablet (20 mg total) by mouth daily for 5 days., Disp: 5 tablet, Rfl: 0   gabapentin (NEURONTIN) 100 MG capsule, Take 100 mg by mouth 2 (two) times daily., Disp: , Rfl:    gabapentin (NEURONTIN) 600 MG tablet, Take by mouth., Disp: , Rfl:    hydrOXYzine (ATARAX/VISTARIL) 25 MG tablet, Take 25 mg by mouth 3 (three) times daily., Disp: , Rfl:    hydrOXYzine (ATARAX/VISTARIL) 50 MG tablet, Take 50 mg by mouth at bedtime., Disp: , Rfl:    LATUDA 60 MG TABS, Take 1 tablet by mouth at bedtime., Disp: , Rfl:    losartan-hydrochlorothiazide (HYZAAR) 100-25 MG tablet, Take 1 tablet by mouth daily., Disp: , Rfl:    pantoprazole (PROTONIX) 20 MG tablet, Take 20 mg by mouth  daily., Disp: , Rfl:    potassium chloride SA (KLOR-CON) 20 MEQ tablet, Take 20 mEq by mouth daily., Disp: , Rfl:    REXULTI 2 MG TABS tablet, Take 2 mg by mouth daily., Disp: , Rfl:    spironolactone (ALDACTONE) 25 MG tablet, Take 25 mg by mouth daily., Disp: , Rfl:    traZODone (DESYREL) 50 MG tablet, Take 50 mg by mouth at bedtime., Disp: , Rfl:   Allergies  Allergen Reactions   Penicillins Hives and Rash    Has patient had a PCN reaction causing immediate rash, facial/tongue/throat swelling, SOB or lightheadedness with hypotension: Yes Has patient had a PCN reaction causing severe rash involving mucus membranes or skin necrosis: No Has patient had a PCN reaction that required hospitalization No Has patient had a PCN reaction occurring within the last 10 years: No If all of the above answers are "NO", then may proceed with  Cephalosporin use.   Metronidazole Rash    Other reaction(s): Skin Rashes, Hives   Review of Systems Objective:  There were no vitals filed for this visit.  General: Well developed, nourished, in no acute distress, alert and oriented x3   Dermatological: Skin is warm, dry and supple bilateral. Nails x 10 are well maintained; remaining integument appears unremarkable at this time. There are no open sores, no preulcerative lesions, no rash or signs of infection present.  Vascular: Dorsalis Pedis artery and Posterior Tibial artery pedal pulses are 2/4 bilateral with immedate capillary fill time. Pedal hair growth present. No varicosities and no lower extremity edema present bilateral.   Neruologic: Grossly intact via light touch bilateral. Vibratory intact via tuning fork bilateral. Protective threshold with Semmes Wienstein monofilament intact to all pedal sites bilateral. Patellar and Achilles deep tendon reflexes 2+ bilateral. No Babinski or clonus noted bilateral.   Musculoskeletal: No gross boney pedal deformities bilateral. No pain, crepitus, or limitation noted with foot and ankle range of motion bilateral. Muscular strength 5/5 in all groups tested bilateral.  Pain and warmth on palpation insertion of the Achilles tendon posterior aspect of the calcaneus.  Good plantar flexion against resistance though.  She also has tenderness on palpation medial calcaneal tubercle but no pain on medial lateral compression of the calcaneus.  Gait: Unassisted, Nonantalgic.    Radiographs:  Radiographs taken today demonstrate osseously mature individual left foot demonstrating posterior calcaneal heel spur with soft tissue increase in density at the plantar fashion calcaneal insertion site plantar distally oriented calcaneal heel spur soft tissue increase in density plantar calcaneus at insertion site.  Assessment & Plan:   Assessment: Planter fasciitis left.  Insertional Achilles tendinitis  left.  Plan: Discussed etiology pathology conservative surgical therapies at this point I injected 2 mg of dexamethasone to the bursal sac of the posterior aspect of her heel.  I making sure not to inject into the tendon itself.  Also injected her plantar fashion today 20 mg Kenalog, grams Marcaine point maximal tenderness.  Start her on methylprednisolone.  She cannot take NSAIDs because of her Plavix.     Ardice Boyan T. [place], [place]

## 2021-06-29 DIAGNOSIS — J45998 Other asthma: Secondary | ICD-10-CM | POA: Diagnosis not present

## 2021-06-29 DIAGNOSIS — J449 Chronic obstructive pulmonary disease, unspecified: Secondary | ICD-10-CM | POA: Diagnosis not present

## 2021-07-27 ENCOUNTER — Ambulatory Visit: Payer: Medicare Other | Admitting: Podiatry

## 2021-07-27 DIAGNOSIS — G479 Sleep disorder, unspecified: Secondary | ICD-10-CM | POA: Diagnosis not present

## 2021-07-27 DIAGNOSIS — Z8673 Personal history of transient ischemic attack (TIA), and cerebral infarction without residual deficits: Secondary | ICD-10-CM | POA: Diagnosis not present

## 2021-07-27 DIAGNOSIS — G4733 Obstructive sleep apnea (adult) (pediatric): Secondary | ICD-10-CM | POA: Diagnosis not present

## 2021-07-27 DIAGNOSIS — M79604 Pain in right leg: Secondary | ICD-10-CM | POA: Diagnosis not present

## 2021-07-27 DIAGNOSIS — Z86718 Personal history of other venous thrombosis and embolism: Secondary | ICD-10-CM | POA: Diagnosis not present

## 2021-07-28 ENCOUNTER — Ambulatory Visit
Admission: RE | Admit: 2021-07-28 | Discharge: 2021-07-28 | Disposition: A | Discharge status: Home / Self Care | Payer: Medicare Other | Source: Ambulatory Visit | Attending: Nurse Practitioner | Admitting: Nurse Practitioner

## 2021-07-28 ENCOUNTER — Other Ambulatory Visit: Payer: Self-pay

## 2021-07-28 DIAGNOSIS — Z1231 Encounter for screening mammogram for malignant neoplasm of breast: Secondary | ICD-10-CM | POA: Diagnosis not present

## 2021-08-06 DIAGNOSIS — J449 Chronic obstructive pulmonary disease, unspecified: Secondary | ICD-10-CM | POA: Diagnosis not present

## 2021-08-06 DIAGNOSIS — J45998 Other asthma: Secondary | ICD-10-CM | POA: Diagnosis not present

## 2021-08-19 ENCOUNTER — Other Ambulatory Visit: Payer: Self-pay

## 2021-08-19 ENCOUNTER — Encounter: Payer: Self-pay | Admitting: Podiatry

## 2021-08-19 ENCOUNTER — Ambulatory Visit (INDEPENDENT_AMBULATORY_CARE_PROVIDER_SITE_OTHER): Payer: Medicare Other | Admitting: Podiatry

## 2021-08-19 DIAGNOSIS — S86012A Strain of left Achilles tendon, initial encounter: Secondary | ICD-10-CM

## 2021-08-19 DIAGNOSIS — M722 Plantar fascial fibromatosis: Secondary | ICD-10-CM

## 2021-08-19 DIAGNOSIS — S93692A Other sprain of left foot, initial encounter: Secondary | ICD-10-CM

## 2021-08-19 DIAGNOSIS — M7662 Achilles tendinitis, left leg: Secondary | ICD-10-CM

## 2021-08-19 NOTE — Addendum Note (Signed)
Addended by: Clovis Riley E on: 08/19/2021 01:26 PM   Modules accepted: Orders

## 2021-08-19 NOTE — Progress Notes (Signed)
Lindsay Cooke presents today for follow-up of her Planter fasciitis and Achilles tendinitis states that is really just not any better at all.  She is states that is exquisitely painful and the injection did not help at all.  Assessment: Well-healing objective: Pulses remain palpable left she has severe pain on palpation of the Achilles at the insertion site posteriorly.  Moderate to severe pain on palpation of the plantar fascial.  The medial band of the plantar fascia appears to be more loose on this foot than on the left foot.  Assessment: Cannot rule out a tear of the Achilles at its insertion site nor can we rule out a tear of the plantar fascia.  All conservative therapies have failed.  Plan: At this point due to all conservative therapies failing to alleviate this patient's symptomatology I am requesting an MRI of the left rear foot ankle Achilles and plantar fascial.  This will assist in differential diagnoses treatment options and/or surgical planning.

## 2021-09-11 ENCOUNTER — Other Ambulatory Visit: Payer: Medicare Other

## 2021-09-17 ENCOUNTER — Other Ambulatory Visit: Admission: RE | Admit: 2021-09-17 | Payer: Commercial Managed Care - HMO | Source: Ambulatory Visit

## 2021-09-18 ENCOUNTER — Ambulatory Visit: Payer: Medicare Other | Attending: Otolaryngology

## 2021-09-18 DIAGNOSIS — G4733 Obstructive sleep apnea (adult) (pediatric): Secondary | ICD-10-CM | POA: Diagnosis not present

## 2021-09-22 DIAGNOSIS — I10 Essential (primary) hypertension: Secondary | ICD-10-CM | POA: Diagnosis not present

## 2021-09-22 DIAGNOSIS — E782 Mixed hyperlipidemia: Secondary | ICD-10-CM | POA: Diagnosis not present

## 2021-09-22 DIAGNOSIS — R7303 Prediabetes: Secondary | ICD-10-CM | POA: Diagnosis not present

## 2021-09-24 ENCOUNTER — Other Ambulatory Visit: Payer: Self-pay

## 2021-09-24 DIAGNOSIS — G4733 Obstructive sleep apnea (adult) (pediatric): Secondary | ICD-10-CM | POA: Diagnosis not present

## 2021-09-24 DIAGNOSIS — R7303 Prediabetes: Secondary | ICD-10-CM | POA: Diagnosis not present

## 2021-09-24 DIAGNOSIS — E782 Mixed hyperlipidemia: Secondary | ICD-10-CM | POA: Diagnosis not present

## 2021-09-24 DIAGNOSIS — Z0001 Encounter for general adult medical examination with abnormal findings: Secondary | ICD-10-CM | POA: Diagnosis not present

## 2021-09-24 DIAGNOSIS — I10 Essential (primary) hypertension: Secondary | ICD-10-CM | POA: Diagnosis not present

## 2021-10-02 ENCOUNTER — Ambulatory Visit
Admission: RE | Admit: 2021-10-02 | Discharge: 2021-10-02 | Disposition: A | Discharge status: Home / Self Care | Payer: Commercial Managed Care - HMO | Source: Ambulatory Visit | Attending: Podiatry | Admitting: Podiatry

## 2021-10-02 DIAGNOSIS — S86012A Strain of left Achilles tendon, initial encounter: Secondary | ICD-10-CM | POA: Diagnosis not present

## 2021-10-02 DIAGNOSIS — M7732 Calcaneal spur, left foot: Secondary | ICD-10-CM | POA: Diagnosis not present

## 2021-10-02 DIAGNOSIS — S93692A Other sprain of left foot, initial encounter: Secondary | ICD-10-CM

## 2021-10-02 DIAGNOSIS — M19072 Primary osteoarthritis, left ankle and foot: Secondary | ICD-10-CM | POA: Diagnosis not present

## 2021-10-04 ENCOUNTER — Other Ambulatory Visit: Payer: Commercial Managed Care - HMO

## 2021-10-06 DIAGNOSIS — F32A Depression, unspecified: Secondary | ICD-10-CM | POA: Diagnosis not present

## 2021-10-06 DIAGNOSIS — J302 Other seasonal allergic rhinitis: Secondary | ICD-10-CM | POA: Diagnosis not present

## 2021-10-06 DIAGNOSIS — Z0001 Encounter for general adult medical examination with abnormal findings: Secondary | ICD-10-CM | POA: Diagnosis not present

## 2021-10-09 ENCOUNTER — Telehealth: Payer: Self-pay | Admitting: Podiatry

## 2021-10-09 DIAGNOSIS — E119 Type 2 diabetes mellitus without complications: Secondary | ICD-10-CM | POA: Diagnosis not present

## 2021-10-09 NOTE — Telephone Encounter (Signed)
Patient was calling asking if her [place] results were back. Patient was curious about the results.

## 2021-10-15 ENCOUNTER — Telehealth: Payer: Self-pay | Admitting: *Deleted

## 2021-10-15 NOTE — Telephone Encounter (Signed)
-----   Message from Garrel Ridgel, [place] sent at 10/15/2021  9:39 AM EST ----- Sending MRI for an overread for further review

## 2021-10-15 NOTE — Telephone Encounter (Signed)
Request sent to [facility] to send MRI disc over to [facility]. Order sent to [facility] requesting review of disc.  Patient notified of delay of results.

## 2021-10-19 ENCOUNTER — Ambulatory Visit (INDEPENDENT_AMBULATORY_CARE_PROVIDER_SITE_OTHER): Payer: Medicare Other | Admitting: Podiatry

## 2021-10-19 ENCOUNTER — Encounter: Payer: Self-pay | Admitting: Podiatry

## 2021-10-19 ENCOUNTER — Other Ambulatory Visit: Payer: Self-pay

## 2021-10-19 DIAGNOSIS — M722 Plantar fascial fibromatosis: Secondary | ICD-10-CM

## 2021-10-19 DIAGNOSIS — M7662 Achilles tendinitis, left leg: Secondary | ICD-10-CM | POA: Diagnosis not present

## 2021-10-19 NOTE — Progress Notes (Signed)
She presents today for follow-up for her MRI states that her left foot still is hurting in the back.  Objective: Vital signs are stable she is alert and oriented x3.  Pulses are palpable.  She still has pain on palpation of the posterior aspect of that heel at its insertion site of the Achilles.  MRI does indicate that there is an intrasubstance tear in this area.  No other significant findings.  Assessment Achilles tendinitis intrasubstance tear.  Plan: Discussed etiology pathology and surgical therapies recommended physical therapy for her because of her BMI and her body habitus I do not think a Achilles tenolysis i.e. removal of the tendon from the bone would be appropriate for her.  However I will discuss with Dr. Sherryle Lis the use of Topaz and PRP.  She will follow-up with him within the next couple of weeks for consult.

## 2021-10-19 NOTE — Telephone Encounter (Signed)
Dr Milinda Pointer saw patient today and is referring to Dr. Sherryle Lis for [place]. Called to cancel request of overread with [employer].

## 2021-10-26 ENCOUNTER — Ambulatory Visit: Payer: Medicare Other | Admitting: Podiatry

## 2021-11-09 ENCOUNTER — Ambulatory Visit: Payer: Self-pay | Admitting: Urology

## 2021-11-19 ENCOUNTER — Telehealth: Payer: Self-pay | Admitting: Podiatry

## 2021-11-19 NOTE — Telephone Encounter (Signed)
Orders placed for PT at [facility] ?

## 2021-11-19 NOTE — Telephone Encounter (Signed)
Patient was calling and asking if the nurse could call and set her up some physical therapy. ?

## 2021-11-23 DIAGNOSIS — G4733 Obstructive sleep apnea (adult) (pediatric): Secondary | ICD-10-CM | POA: Diagnosis not present

## 2021-11-30 ENCOUNTER — Ambulatory Visit: Payer: Medicare Other | Admitting: Physical Therapy

## 2021-12-03 DIAGNOSIS — K2100 Gastro-esophageal reflux disease with esophagitis, without bleeding: Secondary | ICD-10-CM | POA: Diagnosis not present

## 2021-12-03 DIAGNOSIS — E782 Mixed hyperlipidemia: Secondary | ICD-10-CM | POA: Diagnosis not present

## 2021-12-03 DIAGNOSIS — I340 Nonrheumatic mitral (valve) insufficiency: Secondary | ICD-10-CM | POA: Diagnosis not present

## 2021-12-03 DIAGNOSIS — I10 Essential (primary) hypertension: Secondary | ICD-10-CM | POA: Diagnosis not present

## 2021-12-07 ENCOUNTER — Encounter: Payer: Medicare Other | Admitting: Physical Therapy

## 2021-12-07 ENCOUNTER — Ambulatory Visit: Payer: Medicare Other | Admitting: Physical Therapy

## 2021-12-09 ENCOUNTER — Ambulatory Visit: Payer: Medicare Other | Admitting: Physical Therapy

## 2021-12-14 ENCOUNTER — Encounter: Payer: Self-pay | Admitting: Physical Therapy

## 2021-12-14 ENCOUNTER — Encounter: Payer: Medicare Other | Admitting: Physical Therapy

## 2021-12-14 ENCOUNTER — Ambulatory Visit: Payer: Medicare Other | Attending: Podiatry | Admitting: Physical Therapy

## 2021-12-14 DIAGNOSIS — M7662 Achilles tendinitis, left leg: Secondary | ICD-10-CM | POA: Insufficient documentation

## 2021-12-14 DIAGNOSIS — M25572 Pain in left ankle and joints of left foot: Secondary | ICD-10-CM | POA: Diagnosis not present

## 2021-12-14 DIAGNOSIS — R262 Difficulty in walking, not elsewhere classified: Secondary | ICD-10-CM | POA: Insufficient documentation

## 2021-12-14 NOTE — Therapy (Signed)
?OUTPATIENT PHYSICAL THERAPY LOWER EXTREMITY EVALUATION ? ? ?Patient Name: Lindsay Cooke ?MRN: [id number] ?DOB:May 17, 1961, 61 y.o., female ?Today's Date: 12/14/2021 ? ? PT End of Session - 12/14/21 0958   ? ? Visit Number 1   ? Number of Visits 24   ? Date for PT Re-Evaluation 03/08/22   ? Authorization Type [employer] MEDICARE   ? Progress Note Due on Visit 10   ? PT Start Time [phone]   ? PT Stop Time 1028   ? PT Time Calculation (min) 38 min   ? Activity Tolerance Patient tolerated treatment well   ? Behavior During Therapy [facility] for tasks assessed/performed   ? ?  ?  ? ?  ? ? ?Past Medical History:  ?Diagnosis Date  ? Allergy   ? Anemia   ? Asthma   ? Benign essential hypertension   ? GERD (gastroesophageal reflux disease)   ? Hyperlipidemia   ? Lumbar spondylosis   ? Obesity   ? Reflux   ? Right leg DVT ([place])   ? Stroke [facility]   ? ?Past Surgical History:  ?Procedure Laterality Date  ? ABDOMINAL HYSTERECTOMY    ? CHOLECYSTECTOMY    ? COLONOSCOPY WITH PROPOFOL N/A 06/16/2017  ? Procedure: COLONOSCOPY WITH PROPOFOL;  Surgeon: Lin Landsman, MD;  Location: [facility] ENDOSCOPY;  Service: Gastroenterology;  Laterality: N/A;  ? COLONOSCOPY WITH PROPOFOL N/A 11/10/2020  ? Procedure: COLONOSCOPY WITH PROPOFOL;  Surgeon: Lin Landsman, MD;  Location: [facility] ENDOSCOPY;  Service: Gastroenterology;  Laterality: N/A;  ? HEEL SPUR EXCISION    ? HERNIA REPAIR    ? KELOID EXCISION    ? ?Patient Active Problem List  ? Diagnosis Date Noted  ? History of colonic polyps   ? Chronic knee pain (Right) 10/17/2019  ? Chronic knee pain (Primary Area of Pain) (Bilateral) (R>L) 10/16/2019  ? BMI 50.0-59.9, adult ([place]) 09/20/2019  ? Right leg pain 09/18/2019  ? Sleep disorder 09/18/2019  ? History of transient ischemic attack (TIA) 01/30/2019  ? History of deep vein thrombosis of lower extremity 01/09/2019  ? TIA (transient ischemic attack) 12/05/2017  ? 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  Positive occult stool blood test   ? Chronic shoulder pain (Secondary Area of Pain) (Bilateral) (R>L) 05/30/2017  ? Chronic upper back pain [facility] Area of Pain) 05/30/2017  ? Chronic pain syndrome 05/30/2017  ? Long term current use of opiate analgesic 05/30/2017  ? Long term prescription opiate use 05/30/2017  ? Opiate use 05/30/2017  ? Other reduced mobility 05/30/2017  ? Disorder of bone, unspecified 05/30/2017  ? Allergic state 05/27/2017  ? Asthma without status asthmaticus 05/27/2017  ? Hyperlipidemia 05/27/2017  ? Sleep apnea 05/27/2017  ? Schizoaffective-type schizophrenia ([place]) 05/27/2017  ? OSA on CPAP 05/27/2017  ? OSA (obstructive sleep apnea) 05/27/2017  ? Family history of other cardiovascular diseases 08/20/2016  ? Dyslipidemia 07/20/2016  ? Shortness of breath 07/08/2016  ? Chest pain 07/01/2016  ? Vitamin D deficiency 06/10/2016  ? Hemorrhoids 04/07/2016  ? Coronary artery disease 12/25/2015  ? Mini stroke 12/17/2015  ? Mild intermittent asthma without complication 37/85/8850  ? Recurrent major depressive disorder, in remission ([place]) 12/17/2015  ? Pre-diabetes 12/17/2015  ? Constipation 12/04/2015  ? CVA (cerebral vascular accident) ([place]) 11/23/2015  ? Hemoptysis 11/23/2015  ? Acute deep vein thrombosis (DVT) of proximal vein of right lower extremity ([place]) 11/23/2015  ? History of pulmonary embolus (PE) 10/01/2015  ?  Encounter for counseling 09/18/2015  ? Headache syndrome 09/04/2015  ? Acute thromboembolism of deep veins of lower extremity ([place]) 05/28/2015  ? Spondylosis 07/16/2014  ? Hearing loss in right ear 08/18/2012  ? FATIGUE 10/03/2008  ? SNORING 10/03/2008  ? GUAIAC POSITIVE STOOL 05/07/2008  ? GASTROENTERITIS 02/23/2008  ? Depression 11/27/2007  ? MEDIAL MENISCUS TEAR, RIGHT 11/20/2007  ? ANEMIA 11/15/2007  ? Osteoarthritis of knee (Right) 11/15/2007  ? Gastroesophageal reflux disease without esophagitis 09/06/2007  ? Morbid obesity ([place]) 07/21/2007  ? Hypertension 07/21/2007  ? ASTHMA  07/21/2007  ? Keloid scar 07/21/2007  ? ? ?PCP: Danelle Berry, NP ? ?REFERRING PROVIDER: Garrel Ridgel, DPM ? ?REFERRING DIAG:  achilles tendinitis of left lower extremity, achilles tendonitis intrasubstance tear left  ? ?THERAPY DIAG:  ?Pain in left ankle and joints of left foot ? ?Difficulty in walking, not elsewhere classified ? ?ONSET DATE: 2-3 years ago per patient (12/14/2021) ? ?SUBJECTIVE:  ? ?SUBJECTIVE STATEMENT: ?Patient reports she came to PT for her bone spur because it has been hurting her a lot. She states she had surgery on her right side which made that side better.  States her doctor told her to come to PT for her left side and if it doesn't get better with PT, they might have to do surgery. She had steroid shots in her left ankle that didn't help at all (unsure). She has been having this pain for about 2-3 years for no apparent reason. Pain came on gradually over time. It is staying the same.  Patient states she ambulates with no AD but her mental health therapist wants her to get a cane because of her bone spur. She states she has a history of low back pain that has previously gone down her right leg and still does sometimes to her knee. Denies pain down the left LE. Dr. Milinda Pointer identified intrasubstance tear on the L achilles tendon.  ? ? ?PERTINENT HISTORY: ?Patient is a 61 y.o. female who presents to outpatient physical therapy with a referral for medical diagnosis achilles tendinitis of left lower extremity, achilles tendonitis intrasubstance tear left. This patient's chief complaints consist of left ankle and posterior heel pain leading to the following functional deficits: difficulty sleeping, household and community ambulation, walking on the stand, weight bearing activities, household chores/tasks, navigating steps/ramp, etc. ?Relevant past medical history and comorbidities include  allergy, anemia, asthma, OSA on CPAP, 3 leaky heart valves (to see cardiologist for this) hypertension,  CAD, GERD, hyperlipidemia, right ankle pain, lumbar spondylosis, obseity, hx R LE DVT, TIA/stroke (a long time ago, cannot remember when, feels she has recovered), cholecystectomy, abdominal hysterectomy, heel spur excision, hernia repair.  Patient denies hx of cancer, seizures, lung problems, unexplained weight loss, unexplained stumbling or dropping things, osteoporosis, and spinal surgery ? ? ?PAIN:  ?Are you having pain? Yes: NPRS scale: Current: 10/10,  Best: 9/10, Worst: 10/10. ?Pain location: medial, lateral, and posterior left ankle and heel ?Pain description: burning ?Aggravating factors: walking, weight bearing,  ?Relieving factors: sitting down.  ? ? ?FUNCTIONAL LIMITATIONS: difficulty sleeping, household and community ambulation, walking on the stand, weight bearing activities, household chores/tasks, navigating steps/ramp, etc. ? ?PRECAUTIONS: None ? ?WEIGHT BEARING RESTRICTIONS No ? ?FALLS:  ?Has patient fallen in last 6 months? No ? ?LIVING ENVIRONMENT: ?She has no concerns about getting around her home safely.  ? ?OCCUPATION: she is on disability but she is unsure for what (has been on it since about 2003).  ? ?LEISURE: go  on to the beach ? ?PLOF: Independent but has never driven. She lives with her daughter and her daughter orders her groceries.  ? ?PATIENT GOALS "want to learn how to drive, wants to get her GED" "I hope it help my ankle" ? ? ?OBJECTIVE:  ? ?DIAGNOSTIC FINDINGS: L ankle MRI report 10/03/2021:  ?"IMPRESSION: ?1. Moderate Achilles insertional tendinosis and partial ?intrasubstance tearing. No evidence of tendon retraction. ?2. The plantar fascia appears normal. ?3. Mild midfoot degenerative changes with scattered small ganglia. ?No acute osseous findings." ? ?OBJECTIVE ? ?SELF- REPORTED FUNCTION ?FOTO score: 84/100 (Lower Leg (w/o Knee) questionnaire) ? ?OBSERVATION/INSPECTION ?Anthropometrics ?Tremor: none ?Body composition: BMI: 56.9 ?Edema: at B LE ?Functional Mobility ?Transfers:  WFL ?Gait: wide stance with moderate compensated trendelenburg bilaterally.  ? ? ?PERIPHERAL JOINT MOTION (in degrees) ? ?Active Range of Motion (AROM) ?*Indicates pain 12/14/21 Date Date  ?Joint/Motion R/L

## 2021-12-15 DIAGNOSIS — K2100 Gastro-esophageal reflux disease with esophagitis, without bleeding: Secondary | ICD-10-CM | POA: Diagnosis not present

## 2021-12-15 DIAGNOSIS — I2510 Atherosclerotic heart disease of native coronary artery without angina pectoris: Secondary | ICD-10-CM | POA: Diagnosis not present

## 2021-12-15 DIAGNOSIS — E782 Mixed hyperlipidemia: Secondary | ICD-10-CM | POA: Diagnosis not present

## 2021-12-15 DIAGNOSIS — I10 Essential (primary) hypertension: Secondary | ICD-10-CM | POA: Diagnosis not present

## 2021-12-15 DIAGNOSIS — I351 Nonrheumatic aortic (valve) insufficiency: Secondary | ICD-10-CM | POA: Diagnosis not present

## 2021-12-15 DIAGNOSIS — I340 Nonrheumatic mitral (valve) insufficiency: Secondary | ICD-10-CM | POA: Diagnosis not present

## 2021-12-16 ENCOUNTER — Encounter: Payer: Self-pay | Admitting: Physical Therapy

## 2021-12-16 ENCOUNTER — Ambulatory Visit: Payer: Medicare Other | Admitting: Physical Therapy

## 2021-12-16 VITALS — BP 109/72 | HR 68

## 2021-12-16 DIAGNOSIS — R262 Difficulty in walking, not elsewhere classified: Secondary | ICD-10-CM

## 2021-12-16 DIAGNOSIS — M25572 Pain in left ankle and joints of left foot: Secondary | ICD-10-CM | POA: Diagnosis not present

## 2021-12-16 DIAGNOSIS — M7662 Achilles tendinitis, left leg: Secondary | ICD-10-CM | POA: Diagnosis not present

## 2021-12-16 NOTE — Therapy (Addendum)
?OUTPATIENT PHYSICAL THERAPY TREATMENT NOTE ? ? ?Patient Name: Lindsay Cooke ?MRN: [id number] ?DOB:1961-07-14, 61 y.o., female ?Today's Date: 12/16/2021 ? ? PT End of Session - 12/16/21 3300   ? ? Visit Number 2   ? Number of Visits 24   ? Date for PT Re-Evaluation 03/08/22   ? Authorization Type [employer] MEDICARE reporting period from 12/14/2021   ? Progress Note Due on Visit 10   ? PT Start Time [phone]   ? PT Stop Time 1025   ? PT Time Calculation (min) 39 min   ? Activity Tolerance Patient tolerated treatment well   ? Behavior During Therapy [facility] for tasks assessed/performed   ? ?  ?  ? ?  ? ? ? ?Past Medical History:  ?Diagnosis Date  ? Allergy   ? Anemia   ? Asthma   ? Benign essential hypertension   ? GERD (gastroesophageal reflux disease)   ? Hyperlipidemia   ? Lumbar spondylosis   ? Obesity   ? Reflux   ? Right leg DVT ([place])   ? Stroke [facility])   ? ?Past Surgical History:  ?Procedure Laterality Date  ? ABDOMINAL HYSTERECTOMY    ? CHOLECYSTECTOMY    ? COLONOSCOPY WITH PROPOFOL N/A 06/16/2017  ? Procedure: COLONOSCOPY WITH PROPOFOL;  Surgeon: Lin Landsman, MD;  Location: [facility] ENDOSCOPY;  Service: Gastroenterology;  Laterality: N/A;  ? COLONOSCOPY WITH PROPOFOL N/A 11/10/2020  ? Procedure: COLONOSCOPY WITH PROPOFOL;  Surgeon: Lin Landsman, MD;  Location: [facility] ENDOSCOPY;  Service: Gastroenterology;  Laterality: N/A;  ? HEEL SPUR EXCISION    ? HERNIA REPAIR    ? KELOID EXCISION    ? ?Patient Active Problem List  ? Diagnosis Date Noted  ? History of colonic polyps   ? Chronic knee pain (Right) 10/17/2019  ? Chronic knee pain (Primary Area of Pain) (Bilateral) (R>L) 10/16/2019  ? BMI 50.0-59.9, adult ([place]) 09/20/2019  ? Right leg pain 09/18/2019  ? Sleep disorder 09/18/2019  ? History of transient ischemic attack (TIA) 01/30/2019  ? History of deep vein thrombosis of lower extremity 01/09/2019  ? TIA (transient ischemic attack) 12/05/2017  ? Chronic deep vein thrombosis (DVT) of distal vein of right lower extremity  ([place]) 08/24/2017  ? Positive occult stool blood test   ? Chronic shoulder pain (Secondary Area of Pain) (Bilateral) (R>L) 05/30/2017  ? Chronic upper back pain [facility] Area of Pain) 05/30/2017  ? Chronic pain syndrome 05/30/2017  ? Long term current use of opiate analgesic 05/30/2017  ? Long term prescription opiate use 05/30/2017  ? Opiate use 05/30/2017  ? Other reduced mobility 05/30/2017  ? Disorder of bone, unspecified 05/30/2017  ? Allergic state 05/27/2017  ? Asthma without status asthmaticus 05/27/2017  ? Hyperlipidemia 05/27/2017  ? Sleep apnea 05/27/2017  ? Schizoaffective-type schizophrenia ([place]) 05/27/2017  ? OSA on CPAP 05/27/2017  ? OSA (obstructive sleep apnea) 05/27/2017  ? Family history of other cardiovascular diseases 08/20/2016  ? Dyslipidemia 07/20/2016  ? Shortness of breath 07/08/2016  ? Chest pain 07/01/2016  ? Vitamin D deficiency 06/10/2016  ? Hemorrhoids 04/07/2016  ? Coronary artery disease 12/25/2015  ? Mini stroke 12/17/2015  ? Mild intermittent asthma without complication 63/33/5456  ? Recurrent major depressive disorder, in remission ([place]) 12/17/2015  ? Pre-diabetes 12/17/2015  ? Constipation 12/04/2015  ? CVA (cerebral vascular accident) ([place]) 11/23/2015  ? Hemoptysis 11/23/2015  ? Acute deep vein thrombosis (DVT) of proximal vein of right lower extremity ([place]) 11/23/2015  ? History of pulmonary  embolus (PE) 10/01/2015  ? Encounter for counseling 09/18/2015  ? Headache syndrome 09/04/2015  ? Acute thromboembolism of deep veins of lower extremity ([place]) 05/28/2015  ? Spondylosis 07/16/2014  ? Hearing loss in right ear 08/18/2012  ? FATIGUE 10/03/2008  ? SNORING 10/03/2008  ? GUAIAC POSITIVE STOOL 05/07/2008  ? GASTROENTERITIS 02/23/2008  ? Depression 11/27/2007  ? MEDIAL MENISCUS TEAR, RIGHT 11/20/2007  ? ANEMIA 11/15/2007  ? Osteoarthritis of knee (Right) 11/15/2007  ? Gastroesophageal reflux disease without esophagitis 09/06/2007  ? Morbid obesity ([place]) 07/21/2007  ? Hypertension  07/21/2007  ? ASTHMA 07/21/2007  ? Keloid scar 07/21/2007  ? ? ?PCP: Danelle Berry, NP ? ?REFERRING PROVIDER: Garrel Ridgel, DPM ? ?REFERRING DIAG:  achilles tendinitis of left lower extremity, achilles tendonitis intrasubstance tear left  ? ?THERAPY DIAG:  ?Pain in left ankle and joints of left foot ? ?Difficulty in walking, not elsewhere classified ? ?ONSET DATE: 2-3 years ago per patient (12/14/2021) ? ? ?PERTINENT HISTORY: Patient is a 61 y.o. female who presents to outpatient physical therapy with a referral for medical diagnosis achilles tendinitis of left lower extremity, achilles tendonitis intrasubstance tear left. This patient's chief complaints consist of left ankle and posterior heel pain leading to the following functional deficits: difficulty sleeping, household and community ambulation, walking on the stand, weight bearing activities, household chores/tasks, navigating steps/ramp, etc. Relevant past medical history and comorbidities include  allergy, anemia, asthma, OSA on CPAP, 3 leaky heart valves (to see cardiologist for this) hypertension, CAD, GERD, hyperlipidemia, right ankle pain, lumbar spondylosis, obseity, hx R LE DVT, TIA/stroke (a long time ago, cannot remember when, feels she has recovered), cholecystectomy, abdominal hysterectomy, heel spur excision, hernia repair.  Patient denies hx of cancer, seizures, lung problems, unexplained weight loss, unexplained stumbling or dropping things, osteoporosis, and spinal surgery ? ?PRECAUTIONS: none ? ?PATIENT GOALS: "want to learn how to drive, wants to get her GED" "I hope it help my ankle" ? ?SUBJECTIVE: Patient reports she is feeling okay today. She states her left ankle is doing okay. Her ankle was sore after last PT session but it is back to baseline now. She states she did okay with her HEP and has no questions.  ? ?PAIN:  ?Are you having pain? Yes. "Not that much" NPRS rating 9/10.  ?Location: left medial and posterior ankle.   ? ? ?OBJECTIVE:  ? ?Vitals:  ? 12/16/21 0953  ?BP: 109/72  ?Pulse: 68  ?Seated, left forearm, automatic adult cuff ? ?TODAY'S TREATMENT: ?Therapeutic exercise: to centralize symptoms and improve ROM, strength, muscular endurance, and activity tolerance required for successful completion of functional activities.  ?- vitals measurement to get baseline prior to exercise ?- NuStep level 1 using bilateral upper and lower extremities. Seat/handle setting 8/7. For improved extremity mobility, muscular endurance, and activity tolerance; and to induce the analgesic effect of aerobic exercise, stimulate improved joint nutrition, and prepare body structures and systems for following interventions. x 5  minutes. Average SPM = 54. ?- Seated L ankle isometric PF into towel, 4x45 seconds (2 min rest between reps) ?- Toe splays, repeated 4 sec holds. Ball of foot and heel maintains contact with floor. To improve intrinsic foot muscle activation and strength in order to better support arch and intrinsic foot structures. 1x20 each side.  ?- Toe Yoga: great toe extension with small toes flexion pressure into floor, repeated 4 sec holds; small toes extension with great toe flexion pressure into floor, repeated 4 sec holds. Ball of foot  and heel maintains contact with floor. To improve intrinsic foot muscle activation and strength in order to better support arch and intrinsic foot structures. 2 min practice on each foot, each side, each way.  ? ? ?- Education on HEP including handout  ? ? ? ?PATIENT EDUCATION:  ?Education details: Exercise purpose/form. Self management techniques. Education on HEP including handout.  ?- Reviewed cancelation/no-show policy with patient and confirmed patient has correct phone number for clinic; patient verbalized understanding (12/16/21). ?Person educated: Patient ?Education method: Explanation, Demonstration, Tactile cues, and Verbal cues ?Education comprehension: verbalized understanding, returned  demonstration, verbal cues required, tactile cues required, and needs further education ? ? ?HOME EXERCISE PROGRAM: ?Access Code: [email or web address] ?URL: [email or web address] ?Date: 12/16/2021 ?Prepared by: Doreene Burke

## 2021-12-17 DIAGNOSIS — I2510 Atherosclerotic heart disease of native coronary artery without angina pectoris: Secondary | ICD-10-CM | POA: Diagnosis not present

## 2021-12-17 DIAGNOSIS — I340 Nonrheumatic mitral (valve) insufficiency: Secondary | ICD-10-CM | POA: Diagnosis not present

## 2021-12-17 DIAGNOSIS — I351 Nonrheumatic aortic (valve) insufficiency: Secondary | ICD-10-CM | POA: Diagnosis not present

## 2021-12-17 DIAGNOSIS — E782 Mixed hyperlipidemia: Secondary | ICD-10-CM | POA: Diagnosis not present

## 2021-12-17 DIAGNOSIS — K2100 Gastro-esophageal reflux disease with esophagitis, without bleeding: Secondary | ICD-10-CM | POA: Diagnosis not present

## 2021-12-17 DIAGNOSIS — I10 Essential (primary) hypertension: Secondary | ICD-10-CM | POA: Diagnosis not present

## 2021-12-18 DIAGNOSIS — J45998 Other asthma: Secondary | ICD-10-CM | POA: Diagnosis not present

## 2021-12-18 DIAGNOSIS — J449 Chronic obstructive pulmonary disease, unspecified: Secondary | ICD-10-CM | POA: Diagnosis not present

## 2021-12-21 ENCOUNTER — Ambulatory Visit: Payer: Medicare Other | Admitting: Physical Therapy

## 2021-12-21 ENCOUNTER — Encounter: Payer: Self-pay | Admitting: Physical Therapy

## 2021-12-21 DIAGNOSIS — R262 Difficulty in walking, not elsewhere classified: Secondary | ICD-10-CM

## 2021-12-21 DIAGNOSIS — M25572 Pain in left ankle and joints of left foot: Secondary | ICD-10-CM

## 2021-12-21 DIAGNOSIS — M7662 Achilles tendinitis, left leg: Secondary | ICD-10-CM | POA: Diagnosis not present

## 2021-12-21 NOTE — Therapy (Signed)
?OUTPATIENT PHYSICAL THERAPY TREATMENT NOTE ? ? ?Patient Name: Lindsay Cooke ?MRN: [id number] ?DOB:06-Nov-1960, 61 y.o., female ?Today's Date: 12/21/2021 ? ? PT End of Session - 12/21/21 0949   ? ? Visit Number 3   ? Number of Visits 24   ? Date for PT Re-Evaluation 03/08/22   ? Authorization Type [employer] MEDICARE reporting period from 12/14/2021   ? Progress Note Due on Visit 10   ? PT Start Time 1884   ? PT Stop Time 1025   ? PT Time Calculation (min) 40 min   ? Activity Tolerance Patient tolerated treatment well   ? Behavior During Therapy [facility] for tasks assessed/performed   ? ?  ?  ? ?  ? ? ? ? ?Past Medical History:  ?Diagnosis Date  ? Allergy   ? Anemia   ? Asthma   ? Benign essential hypertension   ? GERD (gastroesophageal reflux disease)   ? Hyperlipidemia   ? Lumbar spondylosis   ? Obesity   ? Reflux   ? Right leg DVT ([place])   ? Stroke [facility])   ? ?Past Surgical History:  ?Procedure Laterality Date  ? ABDOMINAL HYSTERECTOMY    ? CHOLECYSTECTOMY    ? COLONOSCOPY WITH PROPOFOL N/A 06/16/2017  ? Procedure: COLONOSCOPY WITH PROPOFOL;  Surgeon: Lin Landsman, MD;  Location: [facility] ENDOSCOPY;  Service: Gastroenterology;  Laterality: N/A;  ? COLONOSCOPY WITH PROPOFOL N/A 11/10/2020  ? Procedure: COLONOSCOPY WITH PROPOFOL;  Surgeon: Lin Landsman, MD;  Location: [facility] ENDOSCOPY;  Service: Gastroenterology;  Laterality: N/A;  ? HEEL SPUR EXCISION    ? HERNIA REPAIR    ? KELOID EXCISION    ? ?Patient Active Problem List  ? Diagnosis Date Noted  ? History of colonic polyps   ? Chronic knee pain (Right) 10/17/2019  ? Chronic knee pain (Primary Area of Pain) (Bilateral) (R>L) 10/16/2019  ? BMI 50.0-59.9, adult ([place]) 09/20/2019  ? Right leg pain 09/18/2019  ? Sleep disorder 09/18/2019  ? History of transient ischemic attack (TIA) 01/30/2019  ? History of deep vein thrombosis of lower extremity 01/09/2019  ? TIA (transient ischemic attack) 12/05/2017  ? Chronic deep vein thrombosis (DVT) of distal vein of right lower  extremity ([place]) 08/24/2017  ? Positive occult stool blood test   ? Chronic shoulder pain (Secondary Area of Pain) (Bilateral) (R>L) 05/30/2017  ? Chronic upper back pain [facility] Area of Pain) 05/30/2017  ? Chronic pain syndrome 05/30/2017  ? Long term current use of opiate analgesic 05/30/2017  ? Long term prescription opiate use 05/30/2017  ? Opiate use 05/30/2017  ? Other reduced mobility 05/30/2017  ? Disorder of bone, unspecified 05/30/2017  ? Allergic state 05/27/2017  ? Asthma without status asthmaticus 05/27/2017  ? Hyperlipidemia 05/27/2017  ? Sleep apnea 05/27/2017  ? Schizoaffective-type schizophrenia ([place]) 05/27/2017  ? OSA on CPAP 05/27/2017  ? OSA (obstructive sleep apnea) 05/27/2017  ? Family history of other cardiovascular diseases 08/20/2016  ? Dyslipidemia 07/20/2016  ? Shortness of breath 07/08/2016  ? Chest pain 07/01/2016  ? Vitamin D deficiency 06/10/2016  ? Hemorrhoids 04/07/2016  ? Coronary artery disease 12/25/2015  ? Mini stroke 12/17/2015  ? Mild intermittent asthma without complication 16/60/6301  ? Recurrent major depressive disorder, in remission ([place]) 12/17/2015  ? Pre-diabetes 12/17/2015  ? Constipation 12/04/2015  ? CVA (cerebral vascular accident) ([place]) 11/23/2015  ? Hemoptysis 11/23/2015  ? Acute deep vein thrombosis (DVT) of proximal vein of right lower extremity ([place]) 11/23/2015  ? History of  pulmonary embolus (PE) 10/01/2015  ? Encounter for counseling 09/18/2015  ? Headache syndrome 09/04/2015  ? Acute thromboembolism of deep veins of lower extremity ([place]) 05/28/2015  ? Spondylosis 07/16/2014  ? Hearing loss in right ear 08/18/2012  ? FATIGUE 10/03/2008  ? SNORING 10/03/2008  ? GUAIAC POSITIVE STOOL 05/07/2008  ? GASTROENTERITIS 02/23/2008  ? Depression 11/27/2007  ? MEDIAL MENISCUS TEAR, RIGHT 11/20/2007  ? ANEMIA 11/15/2007  ? Osteoarthritis of knee (Right) 11/15/2007  ? Gastroesophageal reflux disease without esophagitis 09/06/2007  ? Morbid obesity ([place]) 07/21/2007  ?  Hypertension 07/21/2007  ? ASTHMA 07/21/2007  ? Keloid scar 07/21/2007  ? ? ?PCP: Danelle Berry, NP ? ?REFERRING PROVIDER: Garrel Ridgel, DPM ? ?REFERRING DIAG:  achilles tendinitis of left lower extremity, achilles tendonitis intrasubstance tear left  ? ?THERAPY DIAG:  ?Pain in left ankle and joints of left foot ? ?Difficulty in walking, not elsewhere classified ? ?ONSET DATE: 2-3 years ago per patient (12/14/2021) ? ? ?PERTINENT HISTORY: Patient is a 61 y.o. female who presents to outpatient physical therapy with a referral for medical diagnosis achilles tendinitis of left lower extremity, achilles tendonitis intrasubstance tear left. This patient's chief complaints consist of left ankle and posterior heel pain leading to the following functional deficits: difficulty sleeping, household and community ambulation, walking on the stand, weight bearing activities, household chores/tasks, navigating steps/ramp, etc. Relevant past medical history and comorbidities include  allergy, anemia, asthma, OSA on CPAP, 3 leaky heart valves (to see cardiologist for this) hypertension, CAD, GERD, hyperlipidemia, right ankle pain, lumbar spondylosis, obseity, hx R LE DVT, TIA/stroke (a long time ago, cannot remember when, feels she has recovered), cholecystectomy, abdominal hysterectomy, heel spur excision, hernia repair.  Patient denies hx of cancer, seizures, lung problems, unexplained weight loss, unexplained stumbling or dropping things, osteoporosis, and spinal surgery ? ?PRECAUTIONS: none ? ?PATIENT GOALS: "want to learn how to drive, wants to get her GED" "I hope it help my ankle" ? ?SUBJECTIVE: Patient reports her legs hurt this morning and that this is usually how they feel in the morning when she gets up. She report 10/10 pain at bilateral anterior knees and 8/10 pain at posterior left ankle. She states she felt fine after last PT session. She states her HEP is going well but she "just can't get that toe."   ? ?PAIN:   ?Are you having pain? Yes. 10/10 in bilateral anterior knees. 8/10 left posterior left ankle ? ? ?OBJECTIVE:  ? ?TODAY'S TREATMENT: ?Therapeutic exercise: to centralize symptoms and improve ROM, strength, muscular endurance, and activity tolerance required for successful completion of functional activities.  ?- NuStep level 2 using bilateral upper and lower extremities. Seat/handle setting 8/7. For improved extremity mobility, muscular endurance, and activity tolerance; and to induce the analgesic effect of aerobic exercise, stimulate improved joint nutrition, and prepare body structures and systems for following interventions. x 5  minutes. Average SPM = 62. ?- standing B ankle PF isometric heel raise with heels barely off the floor and BUE support, instructed to shift slightly to the right to keep pain down at left ankle, 4x45 seconds (2 min rest between reps, while doing toe exercises) ?- Toe splays, repeated 4 sec holds. Ball of foot and heel maintains contact with floor. To improve intrinsic foot muscle activation and strength in order to better support arch and intrinsic foot structures. 2 min practice with B LE.   ?- Toe Yoga: great toe extension with small toes flexion pressure into floor, repeated 4  sec holds; small toes extension with great toe flexion pressure into floor, repeated 4 sec holds. Ball of foot and heel maintains contact with floor. To improve intrinsic foot muscle activation and strength in order to better support arch and intrinsic foot structures. 2 min practice on each foot, each side, each way.  ?- Seated left ankle BAPS board, level 2, plantarflexion/dorsiflexion 1x20, attempted inversion/eversion (too difficult after 1 min attempt). ?- seated L ankle eversion and inversion against RTB, 3x20 each direction.  ?- seated L ankle inversion/eversion on tilt board, 1x20 ?- Education on HEP including handout  ? ? ? ?PATIENT EDUCATION:  ?Education details: Exercise purpose/form. Self management  techniques. Education on HEP including handout.  ?- Reviewed cancelation/no-show policy with patient and confirmed patient has correct phone number for clinic; patient verbalized understanding (12/16/21). ?Person

## 2021-12-23 ENCOUNTER — Ambulatory Visit: Payer: Medicare Other | Admitting: Physical Therapy

## 2021-12-23 ENCOUNTER — Telehealth: Payer: Self-pay | Admitting: Physical Therapy

## 2021-12-23 NOTE — Telephone Encounter (Signed)
Called patient when she did not come to her appointment scheduled at 9:45am today. She answered and said she had called yesterday to cancel this appointment because she did not have transportation. Confirmed next appointment Monday 02/27/2022 at 9:45am.  ? ?Everlean Alstrom. Graylon Good, PT, DPT ?12/23/21, 10:08 AM ? ?[facility] ?[address] ?[place],  [address] ?P: [phone] I F: [phone] ? ?

## 2021-12-28 ENCOUNTER — Ambulatory Visit: Payer: Medicare Other | Admitting: Physical Therapy

## 2021-12-28 ENCOUNTER — Encounter: Payer: Self-pay | Admitting: Physical Therapy

## 2021-12-28 DIAGNOSIS — M7662 Achilles tendinitis, left leg: Secondary | ICD-10-CM | POA: Diagnosis not present

## 2021-12-28 DIAGNOSIS — M25572 Pain in left ankle and joints of left foot: Secondary | ICD-10-CM

## 2021-12-28 DIAGNOSIS — G4733 Obstructive sleep apnea (adult) (pediatric): Secondary | ICD-10-CM | POA: Diagnosis not present

## 2021-12-28 DIAGNOSIS — R262 Difficulty in walking, not elsewhere classified: Secondary | ICD-10-CM | POA: Diagnosis not present

## 2021-12-28 NOTE — Therapy (Signed)
?OUTPATIENT PHYSICAL THERAPY TREATMENT NOTE ? ? ?Patient Name: Lindsay Cooke ?MRN: [id number] ?DOB:[birth year], 61 y.o., female ?Today's Date: 12/28/2021 ? ? PT End of Session - 12/28/21 1008   ? ? Visit Number 4   ? Number of Visits 24   ? Date for PT Re-Evaluation 03/08/22   ? Authorization Type [employer] MEDICARE reporting period from 12/14/2021   ? Progress Note Due on Visit 10   ? PT Start Time 0940   ? PT Stop Time 1020   ? PT Time Calculation (min) 40 min   ? Activity Tolerance Patient tolerated treatment well   ? Behavior During Therapy [facility] for tasks assessed/performed   ? ?  ?  ? ?  ? ? ? ? ? ?Past Medical History:  ?Diagnosis Date  ? Allergy   ? Anemia   ? Asthma   ? Benign essential hypertension   ? GERD (gastroesophageal reflux disease)   ? Hyperlipidemia   ? Lumbar spondylosis   ? Obesity   ? Reflux   ? Right leg DVT ([place])   ? Stroke [facility])   ? ?Past Surgical History:  ?Procedure Laterality Date  ? ABDOMINAL HYSTERECTOMY    ? CHOLECYSTECTOMY    ? COLONOSCOPY WITH PROPOFOL N/A 06/16/2017  ? Procedure: COLONOSCOPY WITH PROPOFOL;  Surgeon: Lin Landsman, MD;  Location: [facility] ENDOSCOPY;  Service: Gastroenterology;  Laterality: N/A;  ? COLONOSCOPY WITH PROPOFOL N/A 11/10/2020  ? Procedure: COLONOSCOPY WITH PROPOFOL;  Surgeon: Lin Landsman, MD;  Location: [facility] ENDOSCOPY;  Service: Gastroenterology;  Laterality: N/A;  ? HEEL SPUR EXCISION    ? HERNIA REPAIR    ? KELOID EXCISION    ? ?Patient Active Problem List  ? Diagnosis Date Noted  ? History of colonic polyps   ? Chronic knee pain (Right) 10/17/2019  ? Chronic knee pain (Primary Area of Pain) (Bilateral) (R>L) 10/16/2019  ? BMI 50.0-59.9, adult ([place]) 09/20/2019  ? Right leg pain 09/18/2019  ? Sleep disorder 09/18/2019  ? History of transient ischemic attack (TIA) 01/30/2019  ? History of deep vein thrombosis of lower extremity 01/09/2019  ? TIA (transient ischemic attack) 12/05/2017  ? Chronic deep vein thrombosis (DVT) of distal vein of right lower  extremity ([place]) 08/24/2017  ? Positive occult stool blood test   ? Chronic shoulder pain (Secondary Area of Pain) (Bilateral) (R>L) 05/30/2017  ? Chronic upper back pain [facility] Area of Pain) 05/30/2017  ? Chronic pain syndrome 05/30/2017  ? Long term current use of opiate analgesic 05/30/2017  ? Long term prescription opiate use 05/30/2017  ? Opiate use 05/30/2017  ? Other reduced mobility 05/30/2017  ? Disorder of bone, unspecified 05/30/2017  ? Allergic state 05/27/2017  ? Asthma without status asthmaticus 05/27/2017  ? Hyperlipidemia 05/27/2017  ? Sleep apnea 05/27/2017  ? Schizoaffective-type schizophrenia ([place]) 05/27/2017  ? OSA on CPAP 05/27/2017  ? OSA (obstructive sleep apnea) 05/27/2017  ? Family history of other cardiovascular diseases 08/20/2016  ? Dyslipidemia 07/20/2016  ? Shortness of breath 07/08/2016  ? Chest pain 07/01/2016  ? Vitamin D deficiency 06/10/2016  ? Hemorrhoids 04/07/2016  ? Coronary artery disease 12/25/2015  ? Mini stroke 12/17/2015  ? Mild intermittent asthma without complication 42/68/3419  ? Recurrent major depressive disorder, in remission ([place]) 12/17/2015  ? Pre-diabetes 12/17/2015  ? Constipation 12/04/2015  ? CVA (cerebral vascular accident) ([place]) 11/23/2015  ? Hemoptysis 11/23/2015  ? Acute deep vein thrombosis (DVT) of proximal vein of right lower extremity ([place]) 11/23/2015  ? History  of pulmonary embolus (PE) 10/01/2015  ? Encounter for counseling 09/18/2015  ? Headache syndrome 09/04/2015  ? Acute thromboembolism of deep veins of lower extremity ([place]) 05/28/2015  ? Spondylosis 07/16/2014  ? Hearing loss in right ear 08/18/2012  ? FATIGUE 10/03/2008  ? SNORING 10/03/2008  ? GUAIAC POSITIVE STOOL 05/07/2008  ? GASTROENTERITIS 02/23/2008  ? Depression 11/27/2007  ? MEDIAL MENISCUS TEAR, RIGHT 11/20/2007  ? ANEMIA 11/15/2007  ? Osteoarthritis of knee (Right) 11/15/2007  ? Gastroesophageal reflux disease without esophagitis 09/06/2007  ? Morbid obesity ([place]) 07/21/2007  ?  Hypertension 07/21/2007  ? ASTHMA 07/21/2007  ? Keloid scar 07/21/2007  ? ? ?PCP: Danelle Berry, NP ? ?REFERRING PROVIDER: Garrel Ridgel, DPM ? ?REFERRING DIAG:  achilles tendinitis of left lower extremity, achilles tendonitis intrasubstance tear left  ? ?THERAPY DIAG:  ?Pain in left ankle and joints of left foot ? ?Difficulty in walking, not elsewhere classified ? ?ONSET DATE: 2-3 years ago per patient (12/14/2021) ? ? ?PERTINENT HISTORY: Patient is a 61 y.o. female who presents to outpatient physical therapy with a referral for medical diagnosis achilles tendinitis of left lower extremity, achilles tendonitis intrasubstance tear left. This patient's chief complaints consist of left ankle and posterior heel pain leading to the following functional deficits: difficulty sleeping, household and community ambulation, walking on the stand, weight bearing activities, household chores/tasks, navigating steps/ramp, etc. Relevant past medical history and comorbidities include  allergy, anemia, asthma, OSA on CPAP, 3 leaky heart valves (to see cardiologist for this) hypertension, CAD, GERD, hyperlipidemia, right ankle pain, lumbar spondylosis, obseity, hx R LE DVT, TIA/stroke (a long time ago, cannot remember when, feels she has recovered), cholecystectomy, abdominal hysterectomy, heel spur excision, hernia repair.  Patient denies hx of cancer, seizures, lung problems, unexplained weight loss, unexplained stumbling or dropping things, osteoporosis, and spinal surgery ? ?PRECAUTIONS: none ? ?PATIENT GOALS: "want to learn how to drive, wants to get her GED" "I hope it help my ankle" ? ?SUBJECTIVE: Patient reports she is doing okay today. States she has pain in the back of her left heel when she walks on it up to 9/10. She states her HEP is going well but the one with her heels raised was "sore." Toe exercises are hard.  ? ?PAIN:  ?Are you having pain? Yes. 9/10 left posterior left ankle when walking.  ? ? ?OBJECTIVE:   ? ?TODAY'S TREATMENT: ?Therapeutic exercise: to centralize symptoms and improve ROM, strength, muscular endurance, and activity tolerance required for successful completion of functional activities.  ?- NuStep level 3 using bilateral upper and lower extremities. Seat/handle setting 8/7. For improved extremity mobility, muscular endurance, and activity tolerance; and to induce the analgesic effect of aerobic exercise, stimulate improved joint nutrition, and prepare body structures and systems for following interventions. x 5  minutes. Average SPM = 63. ? ?Circuit: ?- standing B ankle PF isometric heel raise with heels barely off the floor and BUE support, instructed to shift slightly to the right to keep pain down at left ankle, 4x45 seconds (2 min rest between reps, while performing ankle exercises) ?- seated L ankle eversion and inversion against RTB, 3x20 each direction.  ? ?- standing forward sway on airex pad, 3x20 (states she feels pain in left knee).  ?- standing arch lift/inversion on airex pad, 3x10 ?- seated long arc quad, 3x10 each side 01/13/09# AW.  ?- step standing ball toss at rebounder with CGA, small pink theraball, 8.5 inch step, 2x30 each side.  ? ? ?PATIENT  EDUCATION:  ?Education details: Exercise purpose/form. Self management techniques.  ?- Reviewed cancelation/no-show policy with patient and confirmed patient has correct phone number for clinic; patient verbalized understanding (12/16/21). ?Person educated: Patient ?Education method: Explanation, Demonstration, Tactile cues, and Verbal cues ?Education comprehension: verbalized understanding, returned demonstration, verbal cues required, tactile cues required, and needs further education ? ? ?HOME EXERCISE PROGRAM: ?Access Code: [email or web address] ?URL: [email or web address] ?Date: 12/21/2021 ?Prepared by: Rosita Kea ? ?Exercises ?- Isometric Heel Raise at Wall  - 2-3 x daily - 4 sets - 1 reps - 45 seconds hold - 2 minutes rest ?- Toe  Spreading  - 1 x daily - 1 sets - 20 reps - 4 seconds hold ?- Seated Lesser Toes Extension  - 1 x daily - 1 sets - 20 reps - 4 seconds hold ? ?ASSESSMENT: ? ?CLINICAL IMPRESSION: ?Patient tolerated treatment with some dif

## 2021-12-30 ENCOUNTER — Encounter: Payer: Self-pay | Admitting: Physical Therapy

## 2021-12-30 ENCOUNTER — Ambulatory Visit: Payer: Medicare Other | Admitting: Physical Therapy

## 2021-12-30 DIAGNOSIS — R262 Difficulty in walking, not elsewhere classified: Secondary | ICD-10-CM | POA: Diagnosis not present

## 2021-12-30 DIAGNOSIS — M25572 Pain in left ankle and joints of left foot: Secondary | ICD-10-CM

## 2021-12-30 DIAGNOSIS — M7662 Achilles tendinitis, left leg: Secondary | ICD-10-CM | POA: Diagnosis not present

## 2021-12-30 NOTE — Therapy (Signed)
?OUTPATIENT PHYSICAL THERAPY TREATMENT NOTE ? ? ?Patient Name: Lindsay Cooke ?MRN: [id number] ?DOB:1961/08/17, 61 y.o., female ?Today's Date: 12/30/2021 ? ? PT End of Session - 12/30/21 2005   ? ? Visit Number 5   ? Number of Visits 24   ? Date for PT Re-Evaluation 03/08/22   ? Authorization Type [employer] MEDICARE reporting period from 12/14/2021   ? Progress Note Due on Visit 10   ? PT Start Time [phone]   ? PT Stop Time 1028   ? PT Time Calculation (min) 38 min   ? Activity Tolerance Patient tolerated treatment well   ? Behavior During Therapy [facility] for tasks assessed/performed   ? ?  ?  ? ?  ? ? ? ? ? ? ?Past Medical History:  ?Diagnosis Date  ? Allergy   ? Anemia   ? Asthma   ? Benign essential hypertension   ? GERD (gastroesophageal reflux disease)   ? Hyperlipidemia   ? Lumbar spondylosis   ? Obesity   ? Reflux   ? Right leg DVT ([place])   ? Stroke [facility])   ? ?Past Surgical History:  ?Procedure Laterality Date  ? ABDOMINAL HYSTERECTOMY    ? CHOLECYSTECTOMY    ? COLONOSCOPY WITH PROPOFOL N/A 06/16/2017  ? Procedure: COLONOSCOPY WITH PROPOFOL;  Surgeon: Lin Landsman, MD;  Location: [facility] ENDOSCOPY;  Service: Gastroenterology;  Laterality: N/A;  ? COLONOSCOPY WITH PROPOFOL N/A 11/10/2020  ? Procedure: COLONOSCOPY WITH PROPOFOL;  Surgeon: Lin Landsman, MD;  Location: [facility] ENDOSCOPY;  Service: Gastroenterology;  Laterality: N/A;  ? HEEL SPUR EXCISION    ? HERNIA REPAIR    ? KELOID EXCISION    ? ?Patient Active Problem List  ? Diagnosis Date Noted  ? History of colonic polyps   ? Chronic knee pain (Right) 10/17/2019  ? Chronic knee pain (Primary Area of Pain) (Bilateral) (R>L) 10/16/2019  ? BMI 50.0-59.9, adult ([place]) 09/20/2019  ? Right leg pain 09/18/2019  ? Sleep disorder 09/18/2019  ? History of transient ischemic attack (TIA) 01/30/2019  ? History of deep vein thrombosis of lower extremity 01/09/2019  ? TIA (transient ischemic attack) 12/05/2017  ? Chronic deep vein thrombosis (DVT) of distal vein of right lower  extremity ([place]) 08/24/2017  ? Positive occult stool blood test   ? Chronic shoulder pain (Secondary Area of Pain) (Bilateral) (R>L) 05/30/2017  ? Chronic upper back pain [facility] Area of Pain) 05/30/2017  ? Chronic pain syndrome 05/30/2017  ? Long term current use of opiate analgesic 05/30/2017  ? Long term prescription opiate use 05/30/2017  ? Opiate use 05/30/2017  ? Other reduced mobility 05/30/2017  ? Disorder of bone, unspecified 05/30/2017  ? Allergic state 05/27/2017  ? Asthma without status asthmaticus 05/27/2017  ? Hyperlipidemia 05/27/2017  ? Sleep apnea 05/27/2017  ? Schizoaffective-type schizophrenia ([place]) 05/27/2017  ? OSA on CPAP 05/27/2017  ? OSA (obstructive sleep apnea) 05/27/2017  ? Family history of other cardiovascular diseases 08/20/2016  ? Dyslipidemia 07/20/2016  ? Shortness of breath 07/08/2016  ? Chest pain 07/01/2016  ? Vitamin D deficiency 06/10/2016  ? Hemorrhoids 04/07/2016  ? Coronary artery disease 12/25/2015  ? Mini stroke 12/17/2015  ? Mild intermittent asthma without complication 96/29/5284  ? Recurrent major depressive disorder, in remission ([place]) 12/17/2015  ? Pre-diabetes 12/17/2015  ? Constipation 12/04/2015  ? CVA (cerebral vascular accident) ([place]) 11/23/2015  ? Hemoptysis 11/23/2015  ? Acute deep vein thrombosis (DVT) of proximal vein of right lower extremity ([place]) 11/23/2015  ?  History of pulmonary embolus (PE) 10/01/2015  ? Encounter for counseling 09/18/2015  ? Headache syndrome 09/04/2015  ? Acute thromboembolism of deep veins of lower extremity ([place]) 05/28/2015  ? Spondylosis 07/16/2014  ? Hearing loss in right ear 08/18/2012  ? FATIGUE 10/03/2008  ? SNORING 10/03/2008  ? GUAIAC POSITIVE STOOL 05/07/2008  ? GASTROENTERITIS 02/23/2008  ? Depression 11/27/2007  ? MEDIAL MENISCUS TEAR, RIGHT 11/20/2007  ? ANEMIA 11/15/2007  ? Osteoarthritis of knee (Right) 11/15/2007  ? Gastroesophageal reflux disease without esophagitis 09/06/2007  ? Morbid obesity ([place]) 07/21/2007  ?  Hypertension 07/21/2007  ? ASTHMA 07/21/2007  ? Keloid scar 07/21/2007  ? ? ?PCP: Danelle Berry, NP ? ?REFERRING PROVIDER: Garrel Ridgel, DPM ? ?REFERRING DIAG:  achilles tendinitis of left lower extremity, achilles tendonitis intrasubstance tear left  ? ?THERAPY DIAG:  ?Pain in left ankle and joints of left foot ? ?Difficulty in walking, not elsewhere classified ? ?ONSET DATE: 2-3 years ago per patient (12/14/2021) ? ? ?PERTINENT HISTORY: Patient is a 61 y.o. female who presents to outpatient physical therapy with a referral for medical diagnosis achilles tendinitis of left lower extremity, achilles tendonitis intrasubstance tear left. This patient's chief complaints consist of left ankle and posterior heel pain leading to the following functional deficits: difficulty sleeping, household and community ambulation, walking on the stand, weight bearing activities, household chores/tasks, navigating steps/ramp, etc. Relevant past medical history and comorbidities include  allergy, anemia, asthma, OSA on CPAP, 3 leaky heart valves (to see cardiologist for this) hypertension, CAD, GERD, hyperlipidemia, right ankle pain, lumbar spondylosis, obseity, hx R LE DVT, TIA/stroke (a long time ago, cannot remember when, feels she has recovered), cholecystectomy, abdominal hysterectomy, heel spur excision, hernia repair.  Patient denies hx of cancer, seizures, lung problems, unexplained weight loss, unexplained stumbling or dropping things, osteoporosis, and spinal surgery ? ?PRECAUTIONS: none ? ?PATIENT GOALS: "want to learn how to drive, wants to get her GED" "I hope it help my ankle" ? ?SUBJECTIVE: Patient reports her right knee is hurting upon arrival. She states she was sore "everywhere" after last PT session. States HEP is going okay.  ? ?PAIN:  ?Are you having pain? Yes. 10/10 right distal knee ? ? ?OBJECTIVE ? ?SELF-REPORTED FUNCTION ?FOTO score: 57/100 (lower leg - without knee - questionnaire) ? ? ?TODAY'S  TREATMENT: ?Therapeutic exercise: to centralize symptoms and improve ROM, strength, muscular endurance, and activity tolerance required for successful completion of functional activities.  ?- NuStep level 4 using bilateral upper and lower extremities. Seat/handle setting 8/7. For improved extremity mobility, muscular endurance, and activity tolerance; and to induce the analgesic effect of aerobic exercise, stimulate improved joint nutrition, and prepare body structures and systems for following interventions. x 5  minutes. Average SPM = 51. ? ?Circuit: ?- standing B ankle PF isometric heel raise with heels barely off the floor and BUE support, instructed to shift slightly to the right to keep pain down at left ankle, 4x45 seconds (2 min rest between reps, while performing ankle exercises) ?- seated long arc quad, 1x10 each side 10# AW. ?- Squats with BUE support focusing on glute activation and proper form with hip hinging, stabilized back, and tibial perpendicular to floor with knees behind toes. 3x10 with cuing to go as low as tolerated/safe.  ?- standing arch lift/inversion on airex pad, 1x10 (significant difficulty coordinating today) ? ?- standing forward sway on airex pad, 3x20 (states she feels pain in left knee).  ?- long sitting with back supported, L ankle  inversion/PF and eversion/PF, 2x20 each way with RTB held by PT who also guided movement for [facility] for a couple of reps prior to each set to ensure correct movement.  ?   ?Pt required multimodal cuing for proper technique and to facilitate improved neuromuscular control, strength, range of motion, and functional ability resulting in improved performance and form. ? ? ?PATIENT EDUCATION:  ?Education details: Exercise purpose/form. Self management techniques.  ?- Reviewed cancelation/no-show policy with patient and confirmed patient has correct phone number for clinic; patient verbalized understanding (12/16/21). ?Person educated: Patient ?Education  method: Explanation, Demonstration, Tactile cues, and Verbal cues ?Education comprehension: verbalized understanding, returned demonstration, verbal cues required, tactile cues required, and needs further education ? ? ?HOME

## 2021-12-31 DIAGNOSIS — R7303 Prediabetes: Secondary | ICD-10-CM | POA: Diagnosis not present

## 2021-12-31 DIAGNOSIS — I10 Essential (primary) hypertension: Secondary | ICD-10-CM | POA: Diagnosis not present

## 2021-12-31 DIAGNOSIS — E782 Mixed hyperlipidemia: Secondary | ICD-10-CM | POA: Diagnosis not present

## 2022-01-01 DIAGNOSIS — M7702 Medial epicondylitis, left elbow: Secondary | ICD-10-CM | POA: Diagnosis not present

## 2022-01-01 DIAGNOSIS — K219 Gastro-esophageal reflux disease without esophagitis: Secondary | ICD-10-CM | POA: Diagnosis not present

## 2022-01-01 DIAGNOSIS — R7303 Prediabetes: Secondary | ICD-10-CM | POA: Diagnosis not present

## 2022-01-01 DIAGNOSIS — G4733 Obstructive sleep apnea (adult) (pediatric): Secondary | ICD-10-CM | POA: Diagnosis not present

## 2022-01-03 DIAGNOSIS — J449 Chronic obstructive pulmonary disease, unspecified: Secondary | ICD-10-CM | POA: Diagnosis not present

## 2022-01-03 DIAGNOSIS — M1990 Unspecified osteoarthritis, unspecified site: Secondary | ICD-10-CM | POA: Diagnosis not present

## 2022-01-03 DIAGNOSIS — I10 Essential (primary) hypertension: Secondary | ICD-10-CM | POA: Diagnosis not present

## 2022-01-04 DIAGNOSIS — G4733 Obstructive sleep apnea (adult) (pediatric): Secondary | ICD-10-CM | POA: Diagnosis not present

## 2022-01-05 ENCOUNTER — Encounter: Payer: Self-pay | Admitting: Physical Therapy

## 2022-01-05 ENCOUNTER — Ambulatory Visit: Payer: Medicare Other | Attending: Podiatry | Admitting: Physical Therapy

## 2022-01-05 DIAGNOSIS — M25572 Pain in left ankle and joints of left foot: Secondary | ICD-10-CM | POA: Insufficient documentation

## 2022-01-05 DIAGNOSIS — R262 Difficulty in walking, not elsewhere classified: Secondary | ICD-10-CM | POA: Diagnosis not present

## 2022-01-05 NOTE — Therapy (Signed)
?OUTPATIENT PHYSICAL THERAPY TREATMENT NOTE ? ? ?Patient Name: Lindsay Cooke ?MRN: [id number] ?DOB:September 03, 1961, 61 y.o., female ?Today's Date: 01/05/2022 ? ? PT End of Session - 01/05/22 1038   ? ? Visit Number 6   ? Number of Visits 24   ? Date for PT Re-Evaluation 03/08/22   ? Authorization Type [employer] MEDICARE reporting period from 12/14/2021   ? Progress Note Due on Visit 10   ? PT Start Time 1035   ? PT Stop Time 1118   ? PT Time Calculation (min) 43 min   ? Activity Tolerance Patient tolerated treatment well   ? Behavior During Therapy [facility] for tasks assessed/performed   ? ?  ?  ? ?  ? ? ? ? ? ? ? ?Past Medical History:  ?Diagnosis Date  ? Allergy   ? Anemia   ? Asthma   ? Benign essential hypertension   ? GERD (gastroesophageal reflux disease)   ? Hyperlipidemia   ? Lumbar spondylosis   ? Obesity   ? Reflux   ? Right leg DVT ([place])   ? Stroke [facility])   ? ?Past Surgical History:  ?Procedure Laterality Date  ? ABDOMINAL HYSTERECTOMY    ? CHOLECYSTECTOMY    ? COLONOSCOPY WITH PROPOFOL N/A 06/16/2017  ? Procedure: COLONOSCOPY WITH PROPOFOL;  Surgeon: Lin Landsman, MD;  Location: [facility] ENDOSCOPY;  Service: Gastroenterology;  Laterality: N/A;  ? COLONOSCOPY WITH PROPOFOL N/A 11/10/2020  ? Procedure: COLONOSCOPY WITH PROPOFOL;  Surgeon: Lin Landsman, MD;  Location: [facility] ENDOSCOPY;  Service: Gastroenterology;  Laterality: N/A;  ? HEEL SPUR EXCISION    ? HERNIA REPAIR    ? KELOID EXCISION    ? ?Patient Active Problem List  ? Diagnosis Date Noted  ? History of colonic polyps   ? Chronic knee pain (Right) 10/17/2019  ? Chronic knee pain (Primary Area of Pain) (Bilateral) (R>L) 10/16/2019  ? BMI 50.0-59.9, adult ([place]) 09/20/2019  ? Right leg pain 09/18/2019  ? Sleep disorder 09/18/2019  ? History of transient ischemic attack (TIA) 01/30/2019  ? History of deep vein thrombosis of lower extremity 01/09/2019  ? TIA (transient ischemic attack) 12/05/2017  ? Chronic deep vein thrombosis (DVT) of distal vein of right lower  extremity ([place]) 08/24/2017  ? Positive occult stool blood test   ? Chronic shoulder pain (Secondary Area of Pain) (Bilateral) (R>L) 05/30/2017  ? Chronic upper back pain [facility] Area of Pain) 05/30/2017  ? Chronic pain syndrome 05/30/2017  ? Long term current use of opiate analgesic 05/30/2017  ? Long term prescription opiate use 05/30/2017  ? Opiate use 05/30/2017  ? Other reduced mobility 05/30/2017  ? Disorder of bone, unspecified 05/30/2017  ? Allergic state 05/27/2017  ? Asthma without status asthmaticus 05/27/2017  ? Hyperlipidemia 05/27/2017  ? Sleep apnea 05/27/2017  ? Schizoaffective-type schizophrenia ([place]) 05/27/2017  ? OSA on CPAP 05/27/2017  ? OSA (obstructive sleep apnea) 05/27/2017  ? Family history of other cardiovascular diseases 08/20/2016  ? Dyslipidemia 07/20/2016  ? Shortness of breath 07/08/2016  ? Chest pain 07/01/2016  ? Vitamin D deficiency 06/10/2016  ? Hemorrhoids 04/07/2016  ? Coronary artery disease 12/25/2015  ? Mini stroke 12/17/2015  ? Mild intermittent asthma without complication 83/15/1761  ? Recurrent major depressive disorder, in remission ([place]) 12/17/2015  ? Pre-diabetes 12/17/2015  ? Constipation 12/04/2015  ? CVA (cerebral vascular accident) ([place]) 11/23/2015  ? Hemoptysis 11/23/2015  ? Acute deep vein thrombosis (DVT) of proximal vein of right lower extremity ([place]) 11/23/2015  ?  History of pulmonary embolus (PE) 10/01/2015  ? Encounter for counseling 09/18/2015  ? Headache syndrome 09/04/2015  ? Acute thromboembolism of deep veins of lower extremity ([place]) 05/28/2015  ? Spondylosis 07/16/2014  ? Hearing loss in right ear 08/18/2012  ? FATIGUE 10/03/2008  ? SNORING 10/03/2008  ? GUAIAC POSITIVE STOOL 05/07/2008  ? GASTROENTERITIS 02/23/2008  ? Depression 11/27/2007  ? MEDIAL MENISCUS TEAR, RIGHT 11/20/2007  ? ANEMIA 11/15/2007  ? Osteoarthritis of knee (Right) 11/15/2007  ? Gastroesophageal reflux disease without esophagitis 09/06/2007  ? Morbid obesity ([place]) 07/21/2007  ?  Hypertension 07/21/2007  ? ASTHMA 07/21/2007  ? Keloid scar 07/21/2007  ? ? ?PCP: Danelle Berry, NP ? ?REFERRING PROVIDER: Garrel Ridgel, DPM ? ?REFERRING DIAG:  achilles tendinitis of left lower extremity, achilles tendonitis intrasubstance tear left  ? ?THERAPY DIAG:  ?Pain in left ankle and joints of left foot ? ?Difficulty in walking, not elsewhere classified ? ?ONSET DATE: 2-3 years ago per patient (12/14/2021) ? ? ?PERTINENT HISTORY: Patient is a 61 y.o. female who presents to outpatient physical therapy with a referral for medical diagnosis achilles tendinitis of left lower extremity, achilles tendonitis intrasubstance tear left. This patient's chief complaints consist of left ankle and posterior heel pain leading to the following functional deficits: difficulty sleeping, household and community ambulation, walking on the stand, weight bearing activities, household chores/tasks, navigating steps/ramp, etc. Relevant past medical history and comorbidities include  allergy, anemia, asthma, OSA on CPAP, 3 leaky heart valves (to see cardiologist for this) hypertension, CAD, GERD, hyperlipidemia, right ankle pain, lumbar spondylosis, obseity, hx R LE DVT, TIA/stroke (a long time ago, cannot remember when, feels she has recovered), cholecystectomy, abdominal hysterectomy, heel spur excision, hernia repair.  Patient denies hx of cancer, seizures, lung problems, unexplained weight loss, unexplained stumbling or dropping things, osteoporosis, and spinal surgery ? ?PRECAUTIONS: none ? ?PATIENT GOALS: "want to learn how to drive, wants to get her GED" "I hope it help my ankle" ? ?SUBJECTIVE: Patient reports her left ankle is feeling okay today. She states she has pain in her right knee of 8/10. She states she felt okay after last PT session. L ankle "hasn't been hurting that much." She states her HEP has been going okay. Patient states she thinks she needs surgery. She states she had the same surgery on her right ankle  and didn't have to do PT that time. She states she does not feel her left ankle is getting better and still has the same amount of pain when she is sitting down.  ? ?PAIN:  ?Are you having pain? Yes. 8/10 right knee ? ? ?OBJECTIVE ? ?TODAY'S TREATMENT: ?Therapeutic exercise: to centralize symptoms and improve ROM, strength, muscular endurance, and activity tolerance required for successful completion of functional activities.  ?- NuStep level 5 using bilateral upper and lower extremities. Seat/handle setting 8/7. For improved extremity mobility, muscular endurance, and activity tolerance; and to induce the analgesic effect of aerobic exercise, stimulate improved joint nutrition, and prepare body structures and systems for following interventions. x 5  minutes. Average SPM = 54. (Difficult, attempted level 5 but was too fatiguing).  ? ?Circuit: ?- standing B ankle PF isometric heel raise with heels barely off the floor and BUE support, instructed to shift slightly to the right to keep pain down at left ankle, 4x45 seconds (2 min rest between reps, while performing ankle exercises) ?- Squats with BUE support focusing on glute activation and proper form with hip hinging, stabilized back, and  tibial perpendicular to floor with knees behind toes. 3x10 with cuing to go as low as tolerated/safe.  ? ?- standing arch lift/inversion on airex pad, 3x10 (difficulty coordinating without lifting toes) ?- standing forward sway on airex pad, 3x20 (states she feels pain in left knee).  ?- long sitting with back supported, L ankle inversion/PF and eversion/PF, 3x20 each way with RTB held by PT who also guided movement for [facility] for a couple of reps prior to each set to ensure correct movement.  ?- took video on patient's phone of long siting exercise to add to HEP ?   ?Pt required multimodal cuing for proper technique and to facilitate improved neuromuscular control, strength, range of motion, and functional ability resulting in  improved performance and form. ? ? ?PATIENT EDUCATION:  ?Education details: Exercise purpose/form. Self management techniques.  ?- Reviewed cancelation/no-show policy with patient and confirmed patient has cor

## 2022-01-07 ENCOUNTER — Encounter: Payer: Medicare Other | Admitting: Physical Therapy

## 2022-01-11 NOTE — Therapy (Addendum)
?OUTPATIENT PHYSICAL THERAPY TREATMENT NOTE ? ? ?Patient Name: Lindsay Cooke ?MRN: [id number] ?DOB:[birth year], 61 y.o., female ?Today's Date: 01/12/2022 ? ? PT End of Session - 01/12/22 0948   ? ? Visit Number 7   ? Number of Visits 24   ? Date for PT Re-Evaluation 03/08/22   ? Authorization Type [employer] MEDICARE reporting period from 12/14/2021   ? Progress Note Due on Visit 10   ? PT Start Time [phone]   ? PT Stop Time 1028   ? PT Time Calculation (min) 40 min   ? Activity Tolerance Patient tolerated treatment well   ? Behavior During Therapy [facility] for tasks assessed/performed   ? ?  ?  ? ?  ? ? ? ? ? ? ? ? ?Past Medical History:  ?Diagnosis Date  ? Allergy   ? Anemia   ? Asthma   ? Benign essential hypertension   ? GERD (gastroesophageal reflux disease)   ? Hyperlipidemia   ? Lumbar spondylosis   ? Obesity   ? Reflux   ? Right leg DVT ([place])   ? Stroke [facility])   ? ?Past Surgical History:  ?Procedure Laterality Date  ? ABDOMINAL HYSTERECTOMY    ? CHOLECYSTECTOMY    ? COLONOSCOPY WITH PROPOFOL N/A 06/16/2017  ? Procedure: COLONOSCOPY WITH PROPOFOL;  Surgeon: Lin Landsman, MD;  Location: [facility] ENDOSCOPY;  Service: Gastroenterology;  Laterality: N/A;  ? COLONOSCOPY WITH PROPOFOL N/A 11/10/2020  ? Procedure: COLONOSCOPY WITH PROPOFOL;  Surgeon: Lin Landsman, MD;  Location: [facility] ENDOSCOPY;  Service: Gastroenterology;  Laterality: N/A;  ? HEEL SPUR EXCISION    ? HERNIA REPAIR    ? KELOID EXCISION    ? ?Patient Active Problem List  ? Diagnosis Date Noted  ? History of colonic polyps   ? Chronic knee pain (Right) 10/17/2019  ? Chronic knee pain (Primary Area of Pain) (Bilateral) (R>L) 10/16/2019  ? BMI 50.0-59.9, adult ([place]) 09/20/2019  ? Right leg pain 09/18/2019  ? Sleep disorder 09/18/2019  ? History of transient ischemic attack (TIA) 01/30/2019  ? History of deep vein thrombosis of lower extremity 01/09/2019  ? TIA (transient ischemic attack) 12/05/2017  ? Chronic deep vein thrombosis (DVT) of distal vein of right lower  extremity ([place]) 08/24/2017  ? Positive occult stool blood test   ? Chronic shoulder pain (Secondary Area of Pain) (Bilateral) (R>L) 05/30/2017  ? Chronic upper back pain [facility] Area of Pain) 05/30/2017  ? Chronic pain syndrome 05/30/2017  ? Long term current use of opiate analgesic 05/30/2017  ? Long term prescription opiate use 05/30/2017  ? Opiate use 05/30/2017  ? Other reduced mobility 05/30/2017  ? Disorder of bone, unspecified 05/30/2017  ? Allergic state 05/27/2017  ? Asthma without status asthmaticus 05/27/2017  ? Hyperlipidemia 05/27/2017  ? Sleep apnea 05/27/2017  ? Schizoaffective-type schizophrenia ([place]) 05/27/2017  ? OSA on CPAP 05/27/2017  ? OSA (obstructive sleep apnea) 05/27/2017  ? Family history of other cardiovascular diseases 08/20/2016  ? Dyslipidemia 07/20/2016  ? Shortness of breath 07/08/2016  ? Chest pain 07/01/2016  ? Vitamin D deficiency 06/10/2016  ? Hemorrhoids 04/07/2016  ? Coronary artery disease 12/25/2015  ? Mini stroke 12/17/2015  ? Mild intermittent asthma without complication 97/35/3299  ? Recurrent major depressive disorder, in remission ([place]) 12/17/2015  ? Pre-diabetes 12/17/2015  ? Constipation 12/04/2015  ? CVA (cerebral vascular accident) ([place]) 11/23/2015  ? Hemoptysis 11/23/2015  ? Acute deep vein thrombosis (DVT) of proximal vein of right lower extremity ([place]) 11/23/2015  ?  History of pulmonary embolus (PE) 10/01/2015  ? Encounter for counseling 09/18/2015  ? Headache syndrome 09/04/2015  ? Acute thromboembolism of deep veins of lower extremity ([place]) 05/28/2015  ? Spondylosis 07/16/2014  ? Hearing loss in right ear 08/18/2012  ? FATIGUE 10/03/2008  ? SNORING 10/03/2008  ? GUAIAC POSITIVE STOOL 05/07/2008  ? GASTROENTERITIS 02/23/2008  ? Depression 11/27/2007  ? MEDIAL MENISCUS TEAR, RIGHT 11/20/2007  ? ANEMIA 11/15/2007  ? Osteoarthritis of knee (Right) 11/15/2007  ? Gastroesophageal reflux disease without esophagitis 09/06/2007  ? Morbid obesity ([place]) 07/21/2007  ?  Hypertension 07/21/2007  ? ASTHMA 07/21/2007  ? Keloid scar 07/21/2007  ? ? ?PCP: Danelle Berry, NP ? ?REFERRING PROVIDER: Garrel Ridgel, DPM ? ?REFERRING DIAG:  achilles tendinitis of left lower extremity, achilles tendonitis intrasubstance tear left  ? ?THERAPY DIAG:  ?Pain in left ankle and joints of left foot ? ?Difficulty in walking, not elsewhere classified ? ?ONSET DATE: 2-3 years ago per patient (12/14/2021) ? ? ?PERTINENT HISTORY: Patient is a 61 y.o. female who presents to outpatient physical therapy with a referral for medical diagnosis achilles tendinitis of left lower extremity, achilles tendonitis intrasubstance tear left. This patient's chief complaints consist of left ankle and posterior heel pain leading to the following functional deficits: difficulty sleeping, household and community ambulation, walking on the stand, weight bearing activities, household chores/tasks, navigating steps/ramp, etc. Relevant past medical history and comorbidities include  allergy, anemia, asthma, OSA on CPAP, 3 leaky heart valves (to see cardiologist for this) hypertension, CAD, GERD, hyperlipidemia, right ankle pain, lumbar spondylosis, obseity, hx R LE DVT, TIA/stroke (a long time ago, cannot remember when, feels she has recovered), cholecystectomy, abdominal hysterectomy, heel spur excision, hernia repair.  Patient denies hx of cancer, seizures, lung problems, unexplained weight loss, unexplained stumbling or dropping things, osteoporosis, and spinal surgery ? ?PRECAUTIONS: none ? ?PATIENT GOALS: "want to learn how to drive, wants to get her GED" "I hope it help my ankle" ? ?SUBJECTIVE: Patient reports her left ankle is doing okay. She has not had any pain since she last came to PT. HEP is going well. She states the rest of her body is feeling okay and she denies pain presently. HEP is going well including the new band exercises. She states she has been doing everything she wants to at home and not avoiding  things.  ? ?PAIN:  ?Are you having pain? No.  ? ? ?OBJECTIVE ? ?TODAY'S TREATMENT: ?Therapeutic exercise: to centralize symptoms and improve ROM, strength, muscular endurance, and activity tolerance required for successful completion of functional activities.  ?- NuStep level 5 using bilateral upper and lower extremities. Seat/handle setting 8/7. For improved extremity mobility, muscular endurance, and activity tolerance; and to induce the analgesic effect of aerobic exercise, stimulate improved joint nutrition, and prepare body structures and systems for following interventions. x 5  minutes. Average SPM = 59. (Difficult).  ?- standing B ankle PF isometric heel raise with heels barely off the floor and BUE support, 4x45 seconds (2 min rest between reps, while performing other exercises) ?- Squats with BUE support focusing on glute activation and proper form with hip hinging, stabilized back, and tibial perpendicular to floor with knees behind toes. 3x10 with cuing to go as low as tolerated/safe.  ?- standing arch lift/inversion on airex pad, 3x10 (difficulty coordinating without lifting toes, or rocking back) ?- standing forward sway on airex pad, 3x20 (states she feels pain in left knee).  ?- long sitting with back supported,  L ankle inversion/PF and eversion/PF, 3x20 each way with BTB held by PT (inversion) or patient (eversion). Cuing for full ROM and best technique. ?   ?Pt required multimodal cuing for proper technique and to facilitate improved neuromuscular control, strength, range of motion, and functional ability resulting in improved performance and form. ? ? ?PATIENT EDUCATION:  ?Education details: Exercise purpose/form. Self management techniques.  ?- Reviewed cancelation/no-show policy with patient and confirmed patient has correct phone number for clinic; patient verbalized understanding (12/16/21). ?Person educated: Patient ?Education method: Explanation, Demonstration, Tactile cues, and Verbal  cues ?Education comprehension: verbalized understanding, returned demonstration, verbal cues required, tactile cues required, and needs further education ? ? ?HOME EXERCISE PROGRAM: ?Access Code: [email or web address] ?URL: http

## 2022-01-12 ENCOUNTER — Ambulatory Visit: Payer: Medicare Other | Admitting: Physical Therapy

## 2022-01-12 ENCOUNTER — Encounter: Payer: Self-pay | Admitting: Physical Therapy

## 2022-01-12 DIAGNOSIS — R262 Difficulty in walking, not elsewhere classified: Secondary | ICD-10-CM

## 2022-01-12 DIAGNOSIS — M25572 Pain in left ankle and joints of left foot: Secondary | ICD-10-CM

## 2022-01-13 NOTE — Therapy (Incomplete)
?OUTPATIENT PHYSICAL THERAPY TREATMENT NOTE ? ? ?Patient Name: Lindsay Cooke ?MRN: [id number] ?DOB:10-09-1960, 61 y.o., female ?Today's Date: 01/13/2022 ? ? ? ? ? ? ? ? ? ? ?Past Medical History:  ?Diagnosis Date  ? Allergy   ? Anemia   ? Asthma   ? Benign essential hypertension   ? GERD (gastroesophageal reflux disease)   ? Hyperlipidemia   ? Lumbar spondylosis   ? Obesity   ? Reflux   ? Right leg DVT ([place])   ? Stroke [facility])   ? ?Past Surgical History:  ?Procedure Laterality Date  ? ABDOMINAL HYSTERECTOMY    ? CHOLECYSTECTOMY    ? COLONOSCOPY WITH PROPOFOL N/A 06/16/2017  ? Procedure: COLONOSCOPY WITH PROPOFOL;  Surgeon: Lin Landsman, MD;  Location: [facility] ENDOSCOPY;  Service: Gastroenterology;  Laterality: N/A;  ? COLONOSCOPY WITH PROPOFOL N/A 11/10/2020  ? Procedure: COLONOSCOPY WITH PROPOFOL;  Surgeon: Lin Landsman, MD;  Location: [facility] ENDOSCOPY;  Service: Gastroenterology;  Laterality: N/A;  ? HEEL SPUR EXCISION    ? HERNIA REPAIR    ? KELOID EXCISION    ? ?Patient Active Problem List  ? Diagnosis Date Noted  ? History of colonic polyps   ? Chronic knee pain (Right) 10/17/2019  ? Chronic knee pain (Primary Area of Pain) (Bilateral) (R>L) 10/16/2019  ? BMI 50.0-59.9, adult ([place]) 09/20/2019  ? Right leg pain 09/18/2019  ? Sleep disorder 09/18/2019  ? History of transient ischemic attack (TIA) 01/30/2019  ? History of deep vein thrombosis of lower extremity 01/09/2019  ? TIA (transient ischemic attack) 12/05/2017  ? Chronic deep vein thrombosis (DVT) of distal vein of right lower extremity ([place]) 08/24/2017  ? Positive occult stool blood test   ? Chronic shoulder pain (Secondary Area of Pain) (Bilateral) (R>L) 05/30/2017  ? Chronic upper back pain [facility] Area of Pain) 05/30/2017  ? Chronic pain syndrome 05/30/2017  ? Long term current use of opiate analgesic 05/30/2017  ? Long term prescription opiate use 05/30/2017  ? Opiate use 05/30/2017  ? Other reduced mobility 05/30/2017  ? Disorder of bone,  unspecified 05/30/2017  ? Allergic state 05/27/2017  ? Asthma without status asthmaticus 05/27/2017  ? Hyperlipidemia 05/27/2017  ? Sleep apnea 05/27/2017  ? Schizoaffective-type schizophrenia ([place]) 05/27/2017  ? OSA on CPAP 05/27/2017  ? OSA (obstructive sleep apnea) 05/27/2017  ? Family history of other cardiovascular diseases 08/20/2016  ? Dyslipidemia 07/20/2016  ? Shortness of breath 07/08/2016  ? Chest pain 07/01/2016  ? Vitamin D deficiency 06/10/2016  ? Hemorrhoids 04/07/2016  ? Coronary artery disease 12/25/2015  ? Mini stroke 12/17/2015  ? Mild intermittent asthma without complication 86/76/7209  ? Recurrent major depressive disorder, in remission ([place]) 12/17/2015  ? Pre-diabetes 12/17/2015  ? Constipation 12/04/2015  ? CVA (cerebral vascular accident) ([place]) 11/23/2015  ? Hemoptysis 11/23/2015  ? Acute deep vein thrombosis (DVT) of proximal vein of right lower extremity ([place]) 11/23/2015  ? History of pulmonary embolus (PE) 10/01/2015  ? Encounter for counseling 09/18/2015  ? Headache syndrome 09/04/2015  ? Acute thromboembolism of deep veins of lower extremity ([place]) 05/28/2015  ? Spondylosis 07/16/2014  ? Hearing loss in right ear 08/18/2012  ? FATIGUE 10/03/2008  ? SNORING 10/03/2008  ? GUAIAC POSITIVE STOOL 05/07/2008  ? GASTROENTERITIS 02/23/2008  ? Depression 11/27/2007  ? MEDIAL MENISCUS TEAR, RIGHT 11/20/2007  ? ANEMIA 11/15/2007  ? Osteoarthritis of knee (Right) 11/15/2007  ? Gastroesophageal reflux disease without esophagitis 09/06/2007  ? Morbid obesity ([place]) 07/21/2007  ? Hypertension 07/21/2007  ?  ASTHMA 07/21/2007  ? Keloid scar 07/21/2007  ? ? ?PCP: Danelle Berry, NP ? ?REFERRING PROVIDER: Garrel Ridgel, DPM ? ?REFERRING DIAG:  achilles tendinitis of left lower extremity, achilles tendonitis intrasubstance tear left  ? ?THERAPY DIAG:  ?No diagnosis found. ? ?ONSET DATE: 2-3 years ago per patient (12/14/2021) ? ? ?PERTINENT HISTORY: Patient is a 61 y.o. female who presents to outpatient  physical therapy with a referral for medical diagnosis achilles tendinitis of left lower extremity, achilles tendonitis intrasubstance tear left. This patient's chief complaints consist of left ankle and posterior heel pain leading to the following functional deficits: difficulty sleeping, household and community ambulation, walking on the stand, weight bearing activities, household chores/tasks, navigating steps/ramp, etc. Relevant past medical history and comorbidities include  allergy, anemia, asthma, OSA on CPAP, 3 leaky heart valves (to see cardiologist for this) hypertension, CAD, GERD, hyperlipidemia, right ankle pain, lumbar spondylosis, obseity, hx R LE DVT, TIA/stroke (a long time ago, cannot remember when, feels she has recovered), cholecystectomy, abdominal hysterectomy, heel spur excision, hernia repair.  Patient denies hx of cancer, seizures, lung problems, unexplained weight loss, unexplained stumbling or dropping things, osteoporosis, and spinal surgery ? ?PRECAUTIONS: none ? ?PATIENT GOALS: "want to learn how to drive, wants to get her GED" "I hope it help my ankle" ? ?SUBJECTIVE: Patient reports her left ankle is doing okay. She has not had any pain since she last came to PT. HEP is going well. She states the rest of her body is feeling okay and she denies pain presently. HEP is going well including the new band exercises. She states she has been doing everything she wants to at home and not avoiding things.  ? ?PAIN:  ?Are you having pain? No.  ? ? ?OBJECTIVE ? ?TODAY'S TREATMENT: ?Therapeutic exercise: to centralize symptoms and improve ROM, strength, muscular endurance, and activity tolerance required for successful completion of functional activities.  ?- NuStep level 5 using bilateral upper and lower extremities. Seat/handle setting 8/7. For improved extremity mobility, muscular endurance, and activity tolerance; and to induce the analgesic effect of aerobic exercise, stimulate improved joint  nutrition, and prepare body structures and systems for following interventions. x 5  minutes. Average SPM = 59. (Difficult).  ?- standing B ankle PF isometric heel raise with heels barely off the floor and BUE support, 4x45 seconds (2 min rest between reps, while performing other exercises) ?- Squats with BUE support focusing on glute activation and proper form with hip hinging, stabilized back, and tibial perpendicular to floor with knees behind toes. 3x10 with cuing to go as low as tolerated/safe.  ?- standing arch lift/inversion on airex pad, 3x10 (difficulty coordinating without lifting toes, or rocking back) ?- standing forward sway on airex pad, 3x20 (states she feels pain in left knee).  ?- long sitting with back supported, L ankle inversion/PF and eversion/PF, 3x20 each way with BTB held by PT (inversion) or patient (eversion). Cuing for full ROM and best technique. ?   ?Pt required multimodal cuing for proper technique and to facilitate improved neuromuscular control, strength, range of motion, and functional ability resulting in improved performance and form. ? ? ?PATIENT EDUCATION:  ?Education details: Exercise purpose/form. Self management techniques.  ?- Reviewed cancelation/no-show policy with patient and confirmed patient has correct phone number for clinic; patient verbalized understanding (12/16/21). ?Person educated: Patient ?Education method: Explanation, Demonstration, Tactile cues, and Verbal cues ?Education comprehension: verbalized understanding, returned demonstration, verbal cues required, tactile cues required, and needs further education ? ? ?  HOME EXERCISE PROGRAM: ?Access Code: [email or web address] ?URL: [email or web address] ?Date: 12/21/2021 ?Prepared by: Rosita Kea ? ?Exercises ?- Isometric Heel Raise at Wall  - 2-3 x daily - 4 sets - 1 reps - 45 seconds hold - 2 minutes rest ?- Toe Spreading  - 1 x daily - 1 sets - 20 reps - 4 seconds hold ?- Seated Lesser Toes Extension  - 1 x  daily - 1 sets - 20 reps - 4 seconds hold ?Iphone vidieo of L ankle pronation and inversion against green theraband, 3x20 each direction daily ? ?ASSESSMENT: ? ?CLINICAL IMPRESSION: ?Patient tolerated treatment wi

## 2022-01-14 ENCOUNTER — Ambulatory Visit: Payer: Medicare Other | Admitting: Physical Therapy

## 2022-01-18 NOTE — Therapy (Incomplete)
?OUTPATIENT PHYSICAL THERAPY TREATMENT NOTE ? ? ?Patient Name: Lindsay Cooke ?MRN: [id number] ?DOB:08-06-1961, 61 y.o., female ?Today's Date: 01/18/2022 ? ? ? ? ? ? ? ? ? ? ?Past Medical History:  ?Diagnosis Date  ? Allergy   ? Anemia   ? Asthma   ? Benign essential hypertension   ? GERD (gastroesophageal reflux disease)   ? Hyperlipidemia   ? Lumbar spondylosis   ? Obesity   ? Reflux   ? Right leg DVT ([place])   ? Stroke [facility])   ? ?Past Surgical History:  ?Procedure Laterality Date  ? ABDOMINAL HYSTERECTOMY    ? CHOLECYSTECTOMY    ? COLONOSCOPY WITH PROPOFOL N/A 06/16/2017  ? Procedure: COLONOSCOPY WITH PROPOFOL;  Surgeon: Lin Landsman, MD;  Location: [facility] ENDOSCOPY;  Service: Gastroenterology;  Laterality: N/A;  ? COLONOSCOPY WITH PROPOFOL N/A 11/10/2020  ? Procedure: COLONOSCOPY WITH PROPOFOL;  Surgeon: Lin Landsman, MD;  Location: [facility] ENDOSCOPY;  Service: Gastroenterology;  Laterality: N/A;  ? HEEL SPUR EXCISION    ? HERNIA REPAIR    ? KELOID EXCISION    ? ?Patient Active Problem List  ? Diagnosis Date Noted  ? History of colonic polyps   ? Chronic knee pain (Right) 10/17/2019  ? Chronic knee pain (Primary Area of Pain) (Bilateral) (R>L) 10/16/2019  ? BMI 50.0-59.9, adult ([place]) 09/20/2019  ? Right leg pain 09/18/2019  ? Sleep disorder 09/18/2019  ? History of transient ischemic attack (TIA) 01/30/2019  ? History of deep vein thrombosis of lower extremity 01/09/2019  ? TIA (transient ischemic attack) 12/05/2017  ? Chronic deep vein thrombosis (DVT) of distal vein of right lower extremity ([place]) 08/24/2017  ? Positive occult stool blood test   ? Chronic shoulder pain (Secondary Area of Pain) (Bilateral) (R>L) 05/30/2017  ? Chronic upper back pain [facility] Area of Pain) 05/30/2017  ? Chronic pain syndrome 05/30/2017  ? Long term current use of opiate analgesic 05/30/2017  ? Long term prescription opiate use 05/30/2017  ? Opiate use 05/30/2017  ? Other reduced mobility 05/30/2017  ? Disorder of bone,  unspecified 05/30/2017  ? Allergic state 05/27/2017  ? Asthma without status asthmaticus 05/27/2017  ? Hyperlipidemia 05/27/2017  ? Sleep apnea 05/27/2017  ? Schizoaffective-type schizophrenia ([place]) 05/27/2017  ? OSA on CPAP 05/27/2017  ? OSA (obstructive sleep apnea) 05/27/2017  ? Family history of other cardiovascular diseases 08/20/2016  ? Dyslipidemia 07/20/2016  ? Shortness of breath 07/08/2016  ? Chest pain 07/01/2016  ? Vitamin D deficiency 06/10/2016  ? Hemorrhoids 04/07/2016  ? Coronary artery disease 12/25/2015  ? Mini stroke 12/17/2015  ? Mild intermittent asthma without complication 75/17/0017  ? Recurrent major depressive disorder, in remission ([place]) 12/17/2015  ? Pre-diabetes 12/17/2015  ? Constipation 12/04/2015  ? CVA (cerebral vascular accident) ([place]) 11/23/2015  ? Hemoptysis 11/23/2015  ? Acute deep vein thrombosis (DVT) of proximal vein of right lower extremity ([place]) 11/23/2015  ? History of pulmonary embolus (PE) 10/01/2015  ? Encounter for counseling 09/18/2015  ? Headache syndrome 09/04/2015  ? Acute thromboembolism of deep veins of lower extremity ([place]) 05/28/2015  ? Spondylosis 07/16/2014  ? Hearing loss in right ear 08/18/2012  ? FATIGUE 10/03/2008  ? SNORING 10/03/2008  ? GUAIAC POSITIVE STOOL 05/07/2008  ? GASTROENTERITIS 02/23/2008  ? Depression 11/27/2007  ? MEDIAL MENISCUS TEAR, RIGHT 11/20/2007  ? ANEMIA 11/15/2007  ? Osteoarthritis of knee (Right) 11/15/2007  ? Gastroesophageal reflux disease without esophagitis 09/06/2007  ? Morbid obesity ([place]) 07/21/2007  ? Hypertension 07/21/2007  ?  ASTHMA 07/21/2007  ? Keloid scar 07/21/2007  ? ? ?PCP: Danelle Berry, NP ? ?REFERRING PROVIDER: Danelle Berry, NP ? ?REFERRING DIAG:  achilles tendinitis of left lower extremity, achilles tendonitis intrasubstance tear left  ? ?THERAPY DIAG:  ?No diagnosis found. ? ?ONSET DATE: 2-3 years ago per patient (12/14/2021) ? ? ?PERTINENT HISTORY: Patient is a 61 y.o. female who presents to outpatient  physical therapy with a referral for medical diagnosis achilles tendinitis of left lower extremity, achilles tendonitis intrasubstance tear left. This patient's chief complaints consist of left ankle and posterior heel pain leading to the following functional deficits: difficulty sleeping, household and community ambulation, walking on the stand, weight bearing activities, household chores/tasks, navigating steps/ramp, etc. Relevant past medical history and comorbidities include  allergy, anemia, asthma, OSA on CPAP, 3 leaky heart valves (to see cardiologist for this) hypertension, CAD, GERD, hyperlipidemia, right ankle pain, lumbar spondylosis, obseity, hx R LE DVT, TIA/stroke (a long time ago, cannot remember when, feels she has recovered), cholecystectomy, abdominal hysterectomy, heel spur excision, hernia repair.  Patient denies hx of cancer, seizures, lung problems, unexplained weight loss, unexplained stumbling or dropping things, osteoporosis, and spinal surgery ? ?PRECAUTIONS: none ? ?PATIENT GOALS: "want to learn how to drive, wants to get her GED" "I hope it help my ankle" ? ?SUBJECTIVE: Patient reports her left ankle is doing okay. She has not had any pain since she last came to PT. HEP is going well. She states the rest of her body is feeling okay and she denies pain presently. HEP is going well including the new band exercises. She states she has been doing everything she wants to at home and not avoiding things.  ? ?PAIN:  ?Are you having pain? No.  ? ? ?OBJECTIVE ? ?TODAY'S TREATMENT: ?Therapeutic exercise: to centralize symptoms and improve ROM, strength, muscular endurance, and activity tolerance required for successful completion of functional activities.  ?- NuStep level 5 using bilateral upper and lower extremities. Seat/handle setting 8/7. For improved extremity mobility, muscular endurance, and activity tolerance; and to induce the analgesic effect of aerobic exercise, stimulate improved joint  nutrition, and prepare body structures and systems for following interventions. x 5  minutes. Average SPM = 59. (Difficult).  ?- standing B ankle PF isometric heel raise with heels barely off the floor and BUE support, 4x45 seconds (2 min rest between reps, while performing other exercises) ?- Squats with BUE support focusing on glute activation and proper form with hip hinging, stabilized back, and tibial perpendicular to floor with knees behind toes. 3x10 with cuing to go as low as tolerated/safe.  ?- standing arch lift/inversion on airex pad, 3x10 (difficulty coordinating without lifting toes, or rocking back) ?- standing forward sway on airex pad, 3x20 (states she feels pain in left knee).  ?- long sitting with back supported, L ankle inversion/PF and eversion/PF, 3x20 each way with BTB held by PT (inversion) or patient (eversion). Cuing for full ROM and best technique. ?   ?Pt required multimodal cuing for proper technique and to facilitate improved neuromuscular control, strength, range of motion, and functional ability resulting in improved performance and form. ? ? ?PATIENT EDUCATION:  ?Education details: Exercise purpose/form. Self management techniques.  ?- Reviewed cancelation/no-show policy with patient and confirmed patient has correct phone number for clinic; patient verbalized understanding (12/16/21). ?Person educated: Patient ?Education method: Explanation, Demonstration, Tactile cues, and Verbal cues ?Education comprehension: verbalized understanding, returned demonstration, verbal cues required, tactile cues required, and needs further education ? ? ?  HOME EXERCISE PROGRAM: ?Access Code: [email or web address] ?URL: [email or web address] ?Date: 12/21/2021 ?Prepared by: Rosita Kea ? ?Exercises ?- Isometric Heel Raise at Wall  - 2-3 x daily - 4 sets - 1 reps - 45 seconds hold - 2 minutes rest ?- Toe Spreading  - 1 x daily - 1 sets - 20 reps - 4 seconds hold ?- Seated Lesser Toes Extension  - 1 x  daily - 1 sets - 20 reps - 4 seconds hold ?Iphone vidieo of L ankle pronation and inversion against green theraband, 3x20 each direction daily ? ?ASSESSMENT: ? ?CLINICAL IMPRESSION: ?Patient tolerated treatmen

## 2022-01-19 ENCOUNTER — Ambulatory Visit: Payer: Medicare Other | Admitting: Physical Therapy

## 2022-01-19 ENCOUNTER — Encounter: Payer: Medicare Other | Admitting: Physical Therapy

## 2022-01-19 ENCOUNTER — Telehealth: Payer: Self-pay | Admitting: Physical Therapy

## 2022-01-19 DIAGNOSIS — M25569 Pain in unspecified knee: Secondary | ICD-10-CM | POA: Diagnosis not present

## 2022-01-19 DIAGNOSIS — Z8673 Personal history of transient ischemic attack (TIA), and cerebral infarction without residual deficits: Secondary | ICD-10-CM | POA: Diagnosis not present

## 2022-01-19 DIAGNOSIS — M25552 Pain in left hip: Secondary | ICD-10-CM | POA: Diagnosis not present

## 2022-01-19 DIAGNOSIS — G4733 Obstructive sleep apnea (adult) (pediatric): Secondary | ICD-10-CM | POA: Diagnosis not present

## 2022-01-19 DIAGNOSIS — Z86718 Personal history of other venous thrombosis and embolism: Secondary | ICD-10-CM | POA: Diagnosis not present

## 2022-01-19 DIAGNOSIS — M79604 Pain in right leg: Secondary | ICD-10-CM | POA: Diagnosis not present

## 2022-01-19 NOTE — Telephone Encounter (Signed)
Called patient when she did not show up for her PT appointment scheduled at 10:30 this morning. No answer left VM letting patient know someone in their household missed their PT appointment this morning and requested a call back. ? ?Everlean Alstrom. Graylon Good, PT, DPT ?01/19/22, 11:15 AM ? ?[facility] ?[address] ?[place], [place] [address] ?P: [phone] I F: [phone] ? ?

## 2022-01-21 ENCOUNTER — Ambulatory Visit: Payer: Medicare Other | Admitting: Physical Therapy

## 2022-01-21 ENCOUNTER — Telehealth: Payer: Self-pay | Admitting: Physical Therapy

## 2022-01-21 NOTE — Telephone Encounter (Signed)
Called patient when she did not show up for her PT appointment scheduled at 10:30am today. Left VM letting her know someone in her household has missed two PT visits in a row and we are planning to cancel future visits if we don't hear back from them. Lindsay Cooke. Graylon Good, PT, DPT 01/21/22, 11:09 AM  [facility] [address] [place], [place] [address] P: [phone] I F: [phone]

## 2022-01-25 DIAGNOSIS — I10 Essential (primary) hypertension: Secondary | ICD-10-CM | POA: Diagnosis not present

## 2022-01-25 DIAGNOSIS — M545 Low back pain, unspecified: Secondary | ICD-10-CM | POA: Diagnosis not present

## 2022-01-25 DIAGNOSIS — R7303 Prediabetes: Secondary | ICD-10-CM | POA: Diagnosis not present

## 2022-01-25 DIAGNOSIS — M79661 Pain in right lower leg: Secondary | ICD-10-CM | POA: Diagnosis not present

## 2022-01-25 DIAGNOSIS — E782 Mixed hyperlipidemia: Secondary | ICD-10-CM | POA: Diagnosis not present

## 2022-01-26 ENCOUNTER — Encounter: Payer: Medicare Other | Admitting: Physical Therapy

## 2022-01-28 ENCOUNTER — Encounter: Payer: Medicare Other | Admitting: Physical Therapy

## 2022-02-02 ENCOUNTER — Encounter: Payer: Medicare Other | Admitting: Physical Therapy

## 2022-02-03 DIAGNOSIS — M79661 Pain in right lower leg: Secondary | ICD-10-CM | POA: Diagnosis not present

## 2022-02-05 ENCOUNTER — Encounter: Payer: Self-pay | Admitting: Emergency Medicine

## 2022-02-05 ENCOUNTER — Other Ambulatory Visit: Payer: Self-pay

## 2022-02-05 ENCOUNTER — Emergency Department
Admission: EM | Admit: 2022-02-05 | Discharge: 2022-02-05 | Disposition: A | Discharge status: Home / Self Care | Payer: Medicare Other | Attending: Emergency Medicine | Admitting: Emergency Medicine

## 2022-02-05 DIAGNOSIS — M542 Cervicalgia: Secondary | ICD-10-CM | POA: Diagnosis not present

## 2022-02-05 DIAGNOSIS — S8002XA Contusion of left knee, initial encounter: Secondary | ICD-10-CM | POA: Insufficient documentation

## 2022-02-05 DIAGNOSIS — Y92410 Unspecified street and highway as the place of occurrence of the external cause: Secondary | ICD-10-CM | POA: Diagnosis not present

## 2022-02-05 DIAGNOSIS — S43402A Unspecified sprain of left shoulder joint, initial encounter: Secondary | ICD-10-CM | POA: Insufficient documentation

## 2022-02-05 DIAGNOSIS — S8992XA Unspecified injury of left lower leg, initial encounter: Secondary | ICD-10-CM | POA: Diagnosis present

## 2022-02-05 DIAGNOSIS — J45909 Unspecified asthma, uncomplicated: Secondary | ICD-10-CM | POA: Diagnosis not present

## 2022-02-05 DIAGNOSIS — S8001XA Contusion of right knee, initial encounter: Secondary | ICD-10-CM | POA: Insufficient documentation

## 2022-02-05 MED ORDER — NAPROXEN 500 MG PO TABS: 500.0000 mg | ORAL_TABLET | Freq: Two times a day (BID) | ORAL | 2 refills | Status: AC

## 2022-02-05 MED ORDER — NAPROXEN 500 MG PO TABS: 500.0000 mg | ORAL_TABLET | Freq: Two times a day (BID) | ORAL | 2 refills | Status: DC
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
## 2022-02-22 DIAGNOSIS — E782 Mixed hyperlipidemia: Secondary | ICD-10-CM | POA: Diagnosis not present

## 2022-02-22 DIAGNOSIS — M545 Low back pain, unspecified: Secondary | ICD-10-CM | POA: Diagnosis not present

## 2022-02-22 DIAGNOSIS — M1990 Unspecified osteoarthritis, unspecified site: Secondary | ICD-10-CM | POA: Diagnosis not present

## 2022-02-22 DIAGNOSIS — M5459 Other low back pain: Secondary | ICD-10-CM | POA: Diagnosis not present

## 2022-02-22 DIAGNOSIS — I10 Essential (primary) hypertension: Secondary | ICD-10-CM | POA: Diagnosis not present

## 2022-02-23 DIAGNOSIS — G4733 Obstructive sleep apnea (adult) (pediatric): Secondary | ICD-10-CM | POA: Diagnosis not present

## 2022-03-25 DIAGNOSIS — G4733 Obstructive sleep apnea (adult) (pediatric): Secondary | ICD-10-CM | POA: Diagnosis not present

## 2022-03-28 DIAGNOSIS — G4733 Obstructive sleep apnea (adult) (pediatric): Secondary | ICD-10-CM | POA: Diagnosis not present

## 2022-04-16 DIAGNOSIS — J45998 Other asthma: Secondary | ICD-10-CM | POA: Diagnosis not present

## 2022-04-16 DIAGNOSIS — J449 Chronic obstructive pulmonary disease, unspecified: Secondary | ICD-10-CM | POA: Diagnosis not present

## 2022-04-25 DIAGNOSIS — G4733 Obstructive sleep apnea (adult) (pediatric): Secondary | ICD-10-CM | POA: Diagnosis not present

## 2022-05-06 DIAGNOSIS — I10 Essential (primary) hypertension: Secondary | ICD-10-CM | POA: Diagnosis not present

## 2022-05-06 DIAGNOSIS — M1990 Unspecified osteoarthritis, unspecified site: Secondary | ICD-10-CM | POA: Diagnosis not present

## 2022-05-06 DIAGNOSIS — J449 Chronic obstructive pulmonary disease, unspecified: Secondary | ICD-10-CM | POA: Diagnosis not present

## 2022-05-17 DIAGNOSIS — J449 Chronic obstructive pulmonary disease, unspecified: Secondary | ICD-10-CM | POA: Diagnosis not present

## 2022-05-17 DIAGNOSIS — J45998 Other asthma: Secondary | ICD-10-CM | POA: Diagnosis not present

## 2022-05-26 DIAGNOSIS — G4733 Obstructive sleep apnea (adult) (pediatric): Secondary | ICD-10-CM | POA: Diagnosis not present

## 2022-05-27 DIAGNOSIS — R7303 Prediabetes: Secondary | ICD-10-CM | POA: Diagnosis not present

## 2022-05-27 DIAGNOSIS — I10 Essential (primary) hypertension: Secondary | ICD-10-CM | POA: Diagnosis not present

## 2022-05-27 DIAGNOSIS — E782 Mixed hyperlipidemia: Secondary | ICD-10-CM | POA: Diagnosis not present

## 2022-05-28 DIAGNOSIS — E782 Mixed hyperlipidemia: Secondary | ICD-10-CM | POA: Diagnosis not present

## 2022-05-28 DIAGNOSIS — R7303 Prediabetes: Secondary | ICD-10-CM | POA: Diagnosis not present

## 2022-05-28 DIAGNOSIS — J302 Other seasonal allergic rhinitis: Secondary | ICD-10-CM | POA: Diagnosis not present

## 2022-05-28 DIAGNOSIS — N189 Chronic kidney disease, unspecified: Secondary | ICD-10-CM | POA: Diagnosis not present

## 2022-06-16 DIAGNOSIS — Z23 Encounter for immunization: Secondary | ICD-10-CM | POA: Diagnosis not present

## 2022-06-25 DIAGNOSIS — G4733 Obstructive sleep apnea (adult) (pediatric): Secondary | ICD-10-CM | POA: Diagnosis not present

## 2022-06-28 DIAGNOSIS — G4733 Obstructive sleep apnea (adult) (pediatric): Secondary | ICD-10-CM | POA: Diagnosis not present

## 2022-07-13 ENCOUNTER — Other Ambulatory Visit: Payer: Self-pay | Admitting: Nurse Practitioner

## 2022-07-13 DIAGNOSIS — Z1231 Encounter for screening mammogram for malignant neoplasm of breast: Secondary | ICD-10-CM

## 2022-07-22 DIAGNOSIS — J45998 Other asthma: Secondary | ICD-10-CM | POA: Diagnosis not present

## 2022-07-22 DIAGNOSIS — J449 Chronic obstructive pulmonary disease, unspecified: Secondary | ICD-10-CM | POA: Diagnosis not present

## 2022-07-22 DIAGNOSIS — G4733 Obstructive sleep apnea (adult) (pediatric): Secondary | ICD-10-CM | POA: Diagnosis not present

## 2022-07-26 DIAGNOSIS — G4733 Obstructive sleep apnea (adult) (pediatric): Secondary | ICD-10-CM | POA: Diagnosis not present

## 2022-07-31 DIAGNOSIS — J069 Acute upper respiratory infection, unspecified: Secondary | ICD-10-CM | POA: Diagnosis not present

## 2022-07-31 DIAGNOSIS — R197 Diarrhea, unspecified: Secondary | ICD-10-CM | POA: Diagnosis not present

## 2022-08-05 DIAGNOSIS — I10 Essential (primary) hypertension: Secondary | ICD-10-CM | POA: Diagnosis not present

## 2022-08-05 DIAGNOSIS — J449 Chronic obstructive pulmonary disease, unspecified: Secondary | ICD-10-CM | POA: Diagnosis not present

## 2022-08-25 DIAGNOSIS — G4733 Obstructive sleep apnea (adult) (pediatric): Secondary | ICD-10-CM | POA: Diagnosis not present

## 2022-09-02 DIAGNOSIS — J449 Chronic obstructive pulmonary disease, unspecified: Secondary | ICD-10-CM | POA: Diagnosis not present

## 2022-09-02 DIAGNOSIS — J45998 Other asthma: Secondary | ICD-10-CM | POA: Diagnosis not present

## 2022-09-08 DIAGNOSIS — I10 Essential (primary) hypertension: Secondary | ICD-10-CM | POA: Diagnosis not present

## 2022-09-08 DIAGNOSIS — E782 Mixed hyperlipidemia: Secondary | ICD-10-CM | POA: Diagnosis not present

## 2022-09-08 DIAGNOSIS — E039 Hypothyroidism, unspecified: Secondary | ICD-10-CM | POA: Diagnosis not present

## 2022-09-17 DIAGNOSIS — E782 Mixed hyperlipidemia: Secondary | ICD-10-CM | POA: Diagnosis not present

## 2022-09-17 DIAGNOSIS — R7303 Prediabetes: Secondary | ICD-10-CM | POA: Diagnosis not present

## 2022-09-17 DIAGNOSIS — M1990 Unspecified osteoarthritis, unspecified site: Secondary | ICD-10-CM | POA: Diagnosis not present

## 2022-09-17 DIAGNOSIS — G4733 Obstructive sleep apnea (adult) (pediatric): Secondary | ICD-10-CM | POA: Diagnosis not present

## 2022-09-17 DIAGNOSIS — I10 Essential (primary) hypertension: Secondary | ICD-10-CM | POA: Diagnosis not present

## 2022-09-24 DIAGNOSIS — J45998 Other asthma: Secondary | ICD-10-CM | POA: Diagnosis not present

## 2022-09-24 DIAGNOSIS — J449 Chronic obstructive pulmonary disease, unspecified: Secondary | ICD-10-CM | POA: Diagnosis not present

## 2022-09-25 DIAGNOSIS — G4733 Obstructive sleep apnea (adult) (pediatric): Secondary | ICD-10-CM | POA: Diagnosis not present

## 2022-09-26 DIAGNOSIS — G4733 Obstructive sleep apnea (adult) (pediatric): Secondary | ICD-10-CM | POA: Diagnosis not present

## 2022-10-18 ENCOUNTER — Ambulatory Visit: Payer: Self-pay | Admitting: Nurse Practitioner

## 2022-10-27 ENCOUNTER — Telehealth: Payer: Self-pay

## 2022-10-27 NOTE — Telephone Encounter (Signed)
Patient called stating she has blood In her stool and would like to make an appt please schedule appt

## 2022-10-29 ENCOUNTER — Ambulatory Visit (INDEPENDENT_AMBULATORY_CARE_PROVIDER_SITE_OTHER): Payer: Medicaid Other | Admitting: Nurse Practitioner

## 2022-10-29 VITALS — BP 130/68 | HR 82 | Ht 61.0 in | Wt 327.8 lb

## 2022-10-29 DIAGNOSIS — K219 Gastro-esophageal reflux disease without esophagitis: Secondary | ICD-10-CM | POA: Diagnosis not present

## 2022-10-29 DIAGNOSIS — M1711 Unilateral primary osteoarthritis, right knee: Secondary | ICD-10-CM | POA: Diagnosis not present

## 2022-10-29 DIAGNOSIS — G4733 Obstructive sleep apnea (adult) (pediatric): Secondary | ICD-10-CM

## 2022-10-29 DIAGNOSIS — G8929 Other chronic pain: Secondary | ICD-10-CM

## 2022-10-29 DIAGNOSIS — K649 Unspecified hemorrhoids: Secondary | ICD-10-CM

## 2022-10-29 DIAGNOSIS — M25562 Pain in left knee: Secondary | ICD-10-CM

## 2022-10-29 DIAGNOSIS — M25561 Pain in right knee: Secondary | ICD-10-CM

## 2022-10-29 DIAGNOSIS — I10 Essential (primary) hypertension: Secondary | ICD-10-CM | POA: Diagnosis not present

## 2022-10-29 MED ORDER — HYDROCORTISONE (PERIANAL) 2.5 % EX CREA: TOPICAL_CREAM | Freq: Three times a day (TID) | CUTANEOUS | Status: DC

## 2022-10-29 NOTE — Patient Instructions (Signed)
1) Hemoccult cards x 3 2) Hemorrhoid cream 3) Low back exercises  4) Follow up appt in 1 week

## 2022-10-29 NOTE — Progress Notes (Signed)
Established Patient Office Visit  Subjective:  Patient ID: Lindsay Cooke, female    DOB: 1961-08-16  Age: 62 y.o. MRN: [id number]  Chief Complaint  Patient presents with   Follow-up    Blood in stool    BRB on TP x 2 days.  No straining.  No constipation.  Reports irritated tissue of her rectum and may be food related.  Denies diarrhea.  Lower back pain reported.     Past Medical History:  Diagnosis Date   Allergy    Anemia    Asthma    Benign essential hypertension    GERD (gastroesophageal reflux disease)    Hyperlipidemia    Lumbar spondylosis    Obesity    Reflux    Right leg DVT (HCC)    Stroke [facility])     Social History   Socioeconomic History   Marital status: Single    Spouse name: Not on file   Number of children: Not on file   Years of education: Not on file   Highest education level: Not on file  Occupational History   Not on file  Tobacco Use   Smoking status: Never   Smokeless tobacco: Never  Vaping Use   Vaping Use: Never used  Substance and Sexual Activity   Alcohol use: No    Alcohol/week: 0.0 standard drinks of alcohol   Drug use: No   Sexual activity: Never  Other Topics Concern   Not on file  Social History Narrative   Not on file   Social Determinants of Health   Financial Resource Strain: Not on file  Food Insecurity: Not on file  Transportation Needs: Not on file  Physical Activity: Not on file  Stress: Not on file  Social Connections: Not on file  Intimate Partner Violence: Not on file    Family History  Problem Relation Age of Onset   Breast cancer Sister    Heart disease Mother    Congestive Heart Failure Sister     Allergies  Allergen Reactions   Penicillins Hives and Rash    Has patient had a PCN reaction causing immediate rash, facial/tongue/throat swelling, SOB or lightheadedness with hypotension: Yes Has patient had a PCN reaction causing severe rash involving mucus membranes or skin necrosis: No Has  patient had a PCN reaction that required hospitalization No Has patient had a PCN reaction occurring within the last 10 years: No If all of the above answers are "NO", then may proceed with Cephalosporin use.   Metronidazole Rash    Other reaction(s): Skin Rashes, Hives    Review of Systems  Constitutional: Negative.   HENT: Negative.    Eyes: Negative.   Respiratory: Negative.    Cardiovascular: Negative.   Gastrointestinal:  Positive for blood in stool.  Musculoskeletal:  Positive for back pain.  Skin: Negative.   Neurological: Negative.   Endo/Heme/Allergies: Negative.   Psychiatric/Behavioral: Negative.         Objective:   BP 130/68   Pulse 82   Ht '5\' 1"'$  (1.549 m)   Wt (!) 327 lb 12.8 oz (148.7 kg)   SpO2 95%   BMI 61.94 kg/m   Vitals:   10/29/22 1130  BP: 130/68  Pulse: 82  Height: '5\' 1"'$  (1.549 m)  Weight: (!) 327 lb 12.8 oz (148.7 kg)  SpO2: 95%  BMI (Calculated): 61.97    Physical Exam Vitals reviewed.  Constitutional:      Appearance: Normal appearance.  HENT:  Head: Normocephalic.     Nose: Nose normal.     Mouth/Throat:     Mouth: Mucous membranes are moist.  Eyes:     Pupils: Pupils are equal, round, and reactive to light.  Cardiovascular:     Rate and Rhythm: Normal rate and regular rhythm.  Pulmonary:     Effort: Pulmonary effort is normal.     Breath sounds: Normal breath sounds.  Abdominal:     General: Bowel sounds are normal.     Palpations: Abdomen is soft.  Musculoskeletal:        General: Tenderness present.     Cervical back: Normal range of motion and neck supple.  Skin:    General: Skin is warm and dry.  Neurological:     Mental Status: She is alert and oriented to person, place, and time.  Psychiatric:        Mood and Affect: Mood normal.        Behavior: Behavior normal.      No results found for any visits on 10/29/22.  No results found for this or any previous visit (from the past 2160 hour(s)).     Assessment & Plan:   Problem List Items Addressed This Visit       Cardiovascular and Mediastinum   Hypertension   Hemorrhoids - Primary   Relevant Medications   hydrocortisone (ANUSOL-HC) 2.5 % rectal cream (Start on 10/29/2022 12:00 PM)     Respiratory   OSA on CPAP     Digestive   Gastroesophageal reflux disease without esophagitis     Musculoskeletal and Integument   Osteoarthritis of knee (Right) (Chronic)     Other   Chronic knee pain (Primary Area of Pain) (Bilateral) (R>L) (Chronic)    No follow-ups on file.   Total time spent: 30 minutes  Evern Bio, NP  10/29/2022

## 2022-11-05 ENCOUNTER — Ambulatory Visit: Payer: Medicaid Other | Admitting: Nurse Practitioner

## 2022-11-10 ENCOUNTER — Telehealth: Payer: Self-pay

## 2022-11-10 NOTE — Telephone Encounter (Signed)
HTN Review Call  Lehr  88 years, Female  DOB: [birth year]  M: [phone]  __________________________________________________ Hypertension Review (HC) Chart Review BP #1 reading (last): 130/68 on: 10/29/2022 BP #2 reading: 125/70 on: 09/17/2022 BP #3 reading: 142/74 on: 05/28/2022 Any of the last 3 BP > 140/90 mmHg?: Yes What recent interventions have been made by any provider to improve the patient's conditions in the last 3 months?: Office Visit: 09/17/22 Evern Bio For follow-up. No medication changes 10/29/22  Evern Bio For hemorrhoids. STARTED Hydrocortisone 2.5% rectal 3 times daily. Has there been any documented recent hospitalizations or ED visits since last visit with Clinical Lead?: No Adherence Review Does the [facility] have access to medication refill data?: Yes Adherence rates for STAR metric medications: Atorvastatin 20 mg -  Bubble pack 10/21/22 did not need to start them till 10/28/22  Two weeks worth  only so patient does not get confused  - informed by [place] Losartan Potassium-HCTZ 100 mg - 25 mg - Bubble pack 10/21/22 did not need to start them till  10/28/22 Two weeks worth only so patient does not get confused - informed by [place] Adherence rates for medications indicated for disease state being reviewed: Losartan Potassium-HCTZ 100 mg - 25 mg - Bubble pack 10/21/22 did not need to start them till  10/28/22 Two weeks worth only so patient does not get confused - informed by [place] Does the patient have >5 day gap between last estimated fill dates for any of the above medications?: No Disease State Questions Able to connect with the Patient?: Yes Is the patient monitoring his/her BP?: No Review recommendations from CPP's note of how often patient should be checking and encourage monitoring blood pressures if patient has history of high BP.: Done What is your blood pressure goal?:  120/80 Educate patient to inform proper points on checking BP at home:: Do not drink caffeine or smoke a cigarette at least 30 min. prior to checking., Sit with feet flat on the floor, arm at heart level. What diet changes have you made to improve your Blood Pressure Control?: eating more home-cooked meals What exercise are you doing to improve your Blood Pressure Control?: no formal exercise Engagement Notes Charlann Lange on 11/09/2022 11:21 AM HC Chart Review: 10 min 11/09/22  HC Assessment call time spent: 15 min 11/09/22   Clinical Lead Review Review Adherence gaps identified?: No Drug Therapy Problems identified?: No Assessment: Controlled Engagement Notes Jerral Ralph on 11/10/2022 08:03 PM Reviewed: 2 mins 8.01pm to 8.03pm

## 2022-11-11 ENCOUNTER — Ambulatory Visit: Payer: Medicaid Other | Admitting: Nurse Practitioner

## 2022-11-23 DIAGNOSIS — H04123 Dry eye syndrome of bilateral lacrimal glands: Secondary | ICD-10-CM | POA: Diagnosis not present

## 2022-11-23 DIAGNOSIS — I10 Essential (primary) hypertension: Secondary | ICD-10-CM | POA: Diagnosis not present

## 2022-11-23 DIAGNOSIS — H2513 Age-related nuclear cataract, bilateral: Secondary | ICD-10-CM | POA: Diagnosis not present

## 2022-11-23 DIAGNOSIS — E113293 Type 2 diabetes mellitus with mild nonproliferative diabetic retinopathy without macular edema, bilateral: Secondary | ICD-10-CM | POA: Diagnosis not present

## 2022-11-25 ENCOUNTER — Other Ambulatory Visit: Payer: Self-pay | Admitting: Cardiovascular Disease

## 2022-11-25 ENCOUNTER — Other Ambulatory Visit: Payer: Self-pay | Admitting: Nurse Practitioner

## 2022-12-05 DIAGNOSIS — J449 Chronic obstructive pulmonary disease, unspecified: Secondary | ICD-10-CM | POA: Diagnosis not present

## 2022-12-05 DIAGNOSIS — I10 Essential (primary) hypertension: Secondary | ICD-10-CM | POA: Diagnosis not present

## 2022-12-05 DIAGNOSIS — M1990 Unspecified osteoarthritis, unspecified site: Secondary | ICD-10-CM | POA: Diagnosis not present

## 2022-12-06 DIAGNOSIS — J449 Chronic obstructive pulmonary disease, unspecified: Secondary | ICD-10-CM | POA: Diagnosis not present

## 2022-12-06 DIAGNOSIS — J45998 Other asthma: Secondary | ICD-10-CM | POA: Diagnosis not present

## 2022-12-20 ENCOUNTER — Other Ambulatory Visit: Payer: Self-pay | Admitting: Nurse Practitioner

## 2022-12-20 ENCOUNTER — Ambulatory Visit
Admission: RE | Admit: 2022-12-20 | Discharge: 2022-12-20 | Disposition: A | Discharge status: Home / Self Care | Payer: 59 | Source: Ambulatory Visit | Attending: Nurse Practitioner | Admitting: Nurse Practitioner

## 2022-12-20 DIAGNOSIS — Z1231 Encounter for screening mammogram for malignant neoplasm of breast: Secondary | ICD-10-CM | POA: Diagnosis not present

## 2022-12-21 ENCOUNTER — Other Ambulatory Visit: Payer: Self-pay | Admitting: Cardiovascular Disease

## 2022-12-24 ENCOUNTER — Other Ambulatory Visit: Payer: Self-pay

## 2022-12-24 NOTE — Telephone Encounter (Signed)
Need to call patient and find out what she's taking

## 2022-12-26 DIAGNOSIS — G4733 Obstructive sleep apnea (adult) (pediatric): Secondary | ICD-10-CM | POA: Diagnosis not present

## 2022-12-27 DIAGNOSIS — J449 Chronic obstructive pulmonary disease, unspecified: Secondary | ICD-10-CM | POA: Diagnosis not present

## 2022-12-27 DIAGNOSIS — J45998 Other asthma: Secondary | ICD-10-CM | POA: Diagnosis not present

## 2022-12-27 MED ORDER — MONTELUKAST SODIUM 10 MG PO TABS: 10.0000 mg | ORAL_TABLET | Freq: Every day | ORAL | 0 refills | Status: DC

## 2022-12-27 MED ORDER — ESCITALOPRAM OXALATE 10 MG PO TABS: 10.0000 mg | ORAL_TABLET | Freq: Every day | ORAL | 3 refills | Status: DC

## 2022-12-27 MED ORDER — CLOPIDOGREL BISULFATE 75 MG PO TABS: 75.0000 mg | ORAL_TABLET | Freq: Every day | ORAL | 3 refills | Status: DC

## 2022-12-27 MED ORDER — ZOLPIDEM TARTRATE ER 6.25 MG PO TBCR: EXTENDED_RELEASE_TABLET | ORAL | 1 refills | Status: DC

## 2023-01-10 ENCOUNTER — Ambulatory Visit: Payer: Medicare HMO

## 2023-01-10 DIAGNOSIS — I10 Essential (primary) hypertension: Secondary | ICD-10-CM

## 2023-01-10 NOTE — Chronic Care Management (AMB) (Signed)
Follow Up Pharmacist Visit (CCM)  Lindsay Cooke,Lindsay Cooke  61 years, Female  DOB: Dec 08, 1960  M: [phone]  __________________________________________________ Clinical Summary Next Pharmacist Follow Up: 05/30/23 Next AWV: yet to be scheduled Summary for PCP:  - Patient needs to be scheduled for F/U with PCP and Cardiologist. She needs updated lab work done. - She is requesting a Blood pressure monitor. I will call her wednesday to fill the form for [facility] OTC catalog. - CP F/u in 4 months and HC HTN assessment in 2 months . Chronic Conditions Patient's Chronic Conditions: Hypertension (HTN), Cardiovascular Disease (CVD), Asthma, Other, Gastroesophageal Reflux Disease (GERD), Osteoarthritis, Chronic Pain, Diabetes (DM), Hyperlipidemia/Dyslipidemia (HLD) List Other Conditions (separated by comma): Sleep apnea . Engagement Notes Laury Axon on 01/05/2023 08:56 AM CTL Prep: 22 mins Disease Assessments Visit Date Visit Completed on: 01/10/2023 Subjective Information Subjective: She wants a blood pressure monitor for at home use. I will call her on 5/8 to fill the form How many hours per night does patient typically sleep?: 11hrs, no day time naps SDOH: Accountable Health Communities Health-Related Social Needs Screening Tool ([employer]) SDOH questions were documented in [employer] within the past 12 months or since hospitalization?: Yes Medication Adherence Does the [facility] have access to medication refill history?: No Is Patient using [facility] pharmacy?: No Name and location of Current pharmacy: [facility]  Current Rx insurance plan: [employer] Are meds delivered by current pharmacy?: Yes - by local pharmacy . Hypertension (HTN) Most Recent BP: 130/68 Most Recent HR: 82 taken on: 10/29/2022 Care Gap: Need BP documented or last BP 140/90 or higher: Addressed Assessed today?: Yes BP today is: does not have a BP  monitor Goal: <130/80 mmHG Is Patient checking BP at home?: Yes Has patient experienced hypotension, dizziness, falls or bradycardia?: No Additional Info: 3-4 bottles of water We discussed: Contacting PCP office for signs and symptoms of high or low blood pressure (hypotension, dizziness, falls, headaches, edema), Proper Home BP Measurement Assessment:: Uncontrolled Drug: Losartan 25mg -Once daily Pharmacist Assessment: Appropriate, Effective, Safe, Accessible Drug: Spironolactone 25mg -Once daily Pharmacist Assessment: Appropriate, Effective, Safe, Accessible . Hyperlipidemia/Dyslipidemia (HLD) Last Lipid panel on: 05/27/2022 TC (Goal<200): 156 LDL: 72 HDL (Goal>40): 70 TG (Goal<150): 71 ASCVD 10-year risk?is:: N/A due to existing ASCVD Assessed today?: No Drug: Atorvastatin 20mg -Once daily Diabetes (DM) Most recent A1C: 6.1 taken on: 05/27/2022 Most Recent GFR: 54 taken on: 09/30/2020 Type: Pre-Diabetes/Impaired Fasting Glucose Assessed today?: No GERD Assessment Assessed today?: No Drug: Pantoprazole DR 40mg -Once daily Asthma Most recent Hemoglobin: 13.3 taken on: 09/22/2021 Assessed today?: No Cardiovascular Disease (CVD) Care Gap: Moderate / high intensity statin therapy needed: Addressed Assessed today?: No Drug: Clopidogrel 75mg -Once daily Osteoarthritis Assessed today?: No Drug: Diclofenac 1% gel-Use as directed Chronic Pain Assessed today?: No General Disease Assessment Assessed today?: No Preventative Health Care Gap: Colorectal cancer screening: Needs to be addressed Care Gap: Breast cancer screening: Addressed Care Gap: Annual Wellness Visit (AWV): Needs to be addressed Engagement Notes Laury Axon on 01/05/2023 08:51 AM LIPID PANEL-Pulled from previous chart prep, no updated labs in EPIC A1C/GFR-Pulled from previous chart prep, no updated labs in EPIC Hemoglobin-Pulled from previous chart prep, no updated labs in EPIC   Asthma: These medications  were listed in the last chart prep but are not in EPIC, unsure if PT is still taking. Budesonide 0.5mg /26ml Perforomist 79mcg/2ml Albuterol Sulfate HFA 108   Colonoscopy is scheduled for March 7th 2025, Last completed March 7th, 2022 Pharmacist Interventions Intervention Details Pharmacist Interventions discussed: Yes Started Therapy: Preventative  therapy Monitoring: Overdue labs, Preventative health screenings, Routine monitoring . Lynann Bologna, PharmD Televisit Documentation 

## 2023-01-13 ENCOUNTER — Encounter: Payer: Self-pay | Admitting: Cardiovascular Disease

## 2023-01-13 ENCOUNTER — Other Ambulatory Visit: Payer: Self-pay | Admitting: Nurse Practitioner

## 2023-01-13 ENCOUNTER — Ambulatory Visit (INDEPENDENT_AMBULATORY_CARE_PROVIDER_SITE_OTHER): Payer: Medicare HMO | Admitting: Cardiovascular Disease

## 2023-01-13 VITALS — BP 130/72 | HR 60 | Ht 61.0 in | Wt 324.0 lb

## 2023-01-13 DIAGNOSIS — E663 Overweight: Secondary | ICD-10-CM

## 2023-01-13 DIAGNOSIS — I10 Essential (primary) hypertension: Secondary | ICD-10-CM | POA: Diagnosis not present

## 2023-01-13 DIAGNOSIS — R7303 Prediabetes: Secondary | ICD-10-CM

## 2023-01-13 DIAGNOSIS — R5383 Other fatigue: Secondary | ICD-10-CM

## 2023-01-13 DIAGNOSIS — R0602 Shortness of breath: Secondary | ICD-10-CM

## 2023-01-13 NOTE — Assessment & Plan Note (Signed)
Controlled today. Continue same medications. 

## 2023-01-13 NOTE — Assessment & Plan Note (Signed)
Echo to check heart function.  

## 2023-01-13 NOTE — Progress Notes (Signed)
Cardiology Office Note   Date:  01/13/2023   ID:  Lindsay Cooke, DOB [birth year], MRN [id number]  PCP:  Orson Eva, NP  Cardiologist:  Adrian Blackwater, MD      History of Present Illness: Lindsay Cooke is a 62 y.o. female who presents for  Chief Complaint  Patient presents with   Follow-up    Follow Up    Patient in office for routine cardiac exam. Denies chest pain, shortness of breath, edema, palpitations.     Past Medical History:  Diagnosis Date   Allergy    Anemia    Asthma    Benign essential hypertension    GERD (gastroesophageal reflux disease)    Hyperlipidemia    Lumbar spondylosis    Obesity    Reflux    Right leg DVT (HCC)    Stroke [facility])      Past Surgical History:  Procedure Laterality Date   ABDOMINAL HYSTERECTOMY     CHOLECYSTECTOMY     COLONOSCOPY WITH PROPOFOL N/A 06/16/2017   Procedure: COLONOSCOPY WITH PROPOFOL;  Surgeon: Toney Reil, MD;  Location: [facility] ENDOSCOPY;  Service: Gastroenterology;  Laterality: N/A;   COLONOSCOPY WITH PROPOFOL N/A 11/10/2020   Procedure: COLONOSCOPY WITH PROPOFOL;  Surgeon: Toney Reil, MD;  Location: [facility] ENDOSCOPY;  Service: Gastroenterology;  Laterality: N/A;   HEEL SPUR EXCISION     HERNIA REPAIR     KELOID EXCISION       Current Outpatient Medications  Medication Sig Dispense Refill   albuterol (PROVENTIL HFA;VENTOLIN HFA) 108 (90 Base) MCG/ACT inhaler Inhale 2 puffs into the lungs every 6 (six) hours as needed for wheezing or shortness of breath. 1 Inhaler 0   aspirin 81 MG chewable tablet Chew 81 mg by mouth daily.     atorvastatin (LIPITOR) 20 MG tablet TAKE 1 TABLET BY MOUTH EVERY EVENING 90 tablet 3   clopidogrel (PLAVIX) 75 MG tablet Take 1 tablet (75 mg total) by mouth daily. 90 tablet 3   diclofenac Sodium (VOLTAREN) 1 % GEL Apply topically.     escitalopram (LEXAPRO) 10 MG tablet Take 1 tablet (10 mg total) by mouth daily. 90 tablet 3   gabapentin (NEURONTIN) 300 MG capsule  Take 900 mg by mouth 2 (two) times daily.     hydrOXYzine (ATARAX/VISTARIL) 50 MG tablet Take 50 mg by mouth at bedtime.     LATUDA 60 MG TABS Take 1 tablet by mouth at bedtime.     losartan (COZAAR) 100 MG tablet TAKE 1 TABLET BY MOUTH DAILY 30 tablet 11   montelukast (SINGULAIR) 10 MG tablet Take 1 tablet (10 mg total) by mouth at bedtime. 90 tablet 0   naproxen (NAPROSYN) 500 MG tablet Take 1 tablet (500 mg total) by mouth 2 (two) times daily with a meal. 20 tablet 2   pantoprazole (PROTONIX) 40 MG tablet Take 40 mg by mouth daily.     REXULTI 2 MG TABS tablet Take 2 mg by mouth daily.     spironolactone (ALDACTONE) 25 MG tablet TAKE 1 TABLET BY MOUTH DAILY 90 tablet 0   zolpidem (AMBIEN CR) 6.25 MG CR tablet TAKE 1 TABLET BY MOUTH AT BEDTIME AS NEEDED FOR INSOMNIA 30 tablet 1   No current facility-administered medications for this visit.    Allergies:   Penicillins and Metronidazole    Social History:   reports that she has never smoked. She has never used smokeless tobacco. She reports that she does not drink alcohol  and does not use drugs.   Family History:  family history includes Breast cancer in her sister; Congestive Heart Failure in her sister; Heart disease in her mother.    ROS:     Review of Systems  Constitutional: Negative.   HENT: Negative.    Eyes: Negative.   Respiratory: Negative.    Cardiovascular: Negative.   Gastrointestinal: Negative.   Genitourinary: Negative.   Musculoskeletal: Negative.   Skin: Negative.   Neurological: Negative.   Endo/Heme/Allergies: Negative.   Psychiatric/Behavioral: Negative.    All other systems reviewed and are negative.   All other systems are reviewed and negative.   PHYSICAL EXAM: VS:  BP 130/72   Pulse 60   Ht 5\' 1"  (1.549 m)   Wt (!) 324 lb (147 kg)   SpO2 99%   BMI 61.22 kg/m  , BMI Body mass index is 61.22 kg/m. Last weight:  Wt Readings from Last 3 Encounters:  01/13/23 (!) 324 lb (147 kg)  10/29/22 (!) 327  lb 12.8 oz (148.7 kg)  02/05/22 280 lb (127 kg)    Physical Exam Constitutional:      Appearance: Normal appearance.  Cardiovascular:     Rate and Rhythm: Normal rate and regular rhythm.     Heart sounds: Normal heart sounds.  Pulmonary:     Effort: Pulmonary effort is normal.     Breath sounds: Normal breath sounds.  Musculoskeletal:     Right lower leg: No edema.     Left lower leg: No edema.  Neurological:     Mental Status: She is alert.     EKG: none today  Recent Labs: No results found for requested labs within last 365 days.    Lipid Panel    Component Value Date/Time   CHOL 184 11/25/2015 0502   TRIG 49 11/25/2015 0502   HDL 66 11/25/2015 0502   CHOLHDL 2.8 11/25/2015 0502   VLDL 10 11/25/2015 0502   LDLCALC 108 (H) 11/25/2015 0502      Other studies Reviewed: Patient: 1859.0 - Lindsay Cooke DOB:  [birth year]  Date:  12/15/2021 10:30 Provider: Adrian Blackwater MD Encounter: ECHO   Page 2 REASON FOR VISIT  Visit for: Echocardiogram/I 34.0  Sex:   Female   wt= 321   lbs.  BP=136/86  Height= 61   inches.   TESTS  Imaging: Echocardiogram:  An echocardiogram in (2-d) mode was performed. Aortic root diameter 3.7   cm. The LVOT internal diameter 3.4   cm. LV systolic dimension 2.57   cm, diastolic 4.21  cm, posterior wall thickness 1.62    cm, fractional shortening 39  %, and EF 69.7 %. IVS thickness 1.53   cm. LA dimension 4.3  cm. Pulmonic Valve has Trace Regurgitation. Tricuspid Valve has Mild to Moderate Regurgitation. Aortic Valve has Mild to Moderate Regurgitation.     ASSESSMENT  Suboptimal study due to poor windows.  Normal chamber sizes.  Normal left ventricular systolic function.  Mild left ventricular hypertrophy with GRADE 1 (relaxation abnormality) diastolic dysfunction.  Normal right ventricular systolic function.  Normal right ventricular diastolic function.  Normal left ventricular wall motion.  Normal right ventricular wall  motion.  Trace pulmonary regurgitation.  Mild to Moderate tricuspid regurgitation.  Normal pulmonary artery pressure.  Mild to Moderate aortic regurgitation.  No pericardial effusion.  Mildly dilated Left and Right Atrium  Mild LVH.   THERAPY   Referring physician: Laurier Nancy  Sonographer: Adrian Blackwater.   Adrian Blackwater MD  Electronically signed by: Adrian Blackwater     Date: 12/17/2021 08:49  Patient: 1859.0 - Kizzie Fantasia DOB:  08-11-1961  Date:  03/29/2019 11:00 Provider: Adrian Blackwater MD Encounter: ALL ANGIOGRAMS (CTA BRAIN, CAROTIDS, RENAL ARTERIES, PE)   Page 2 REASON FOR VISIT  Referred by Dr.Jakiya Bookbinder [place].    TESTS  Imaging: Computed Tomographic Angiography:  Cardiac multidetector CT was performed paying particular attention to the coronary arteries for the diagnosis of: CAD. Diagnostic Drugs:  Administered iohexol (Omnipaque) through an antecubital vein and images from the examination were analyzed for the presence and extent of coronary artery disease, using 3D image processing software. 100 mL of non-ionic contrast (Omnipaque) was used.   TEST CONCLUSIONS  Quality of study: Suboptimal/Poor  1-Calcium score: 0  2-Right dominant system  3-Normal coronaries, but suboptimal study.  Adrian Blackwater MD  Electronically signed by: Adrian Blackwater     Date: 03/29/2019 13:37  Patient: 1859.0 - Kizzie Fantasia DOB:  [birth year]  Date:  02/26/2019 07:45 Provider: Adrian Blackwater MD Encounter: [facility] STRESS TEST   Page 1 TESTS    [facility] [address] [place], [place] [address] [phone] STUDY:  Gated Stress/Rest Myocardial Perfusion With Wall Motion, Left Ventricular Ejection Fraction.Persantine Stress Test. SEX: Female        WEIGHT: 310 lbs          HEIGHT: 61 in                              ARMS UP: YES/NO                                                                                                                                                                                 REFERRING PHYSICIAN:  Dr.Kelsy Polack [place]  INDICATION FOR STUDY:  Heart Failure                                                                                                                                                                                                                    TECHNIQUE:  Approximately 20 minutes following the intravenous administration of 11.6 mCi of Tc-63m Sestamibi after stress testing in a reclined supine position with arms above their head if able to do so, gated SPECT imaging of the heart was performed. After about a 2hr break, the patient was injected intravenously with 33.0 mCi of Tc-82m Sestamibi.  Approximately 45 minutes later in the same position as stress imaging SPECT rest imaging of the heart was performed.  STRESS BY:  Adrian Blackwater, MD PROTOCOL:  Persantine         DOSE ADMIN: 10 cc      ROUTE OF ADMIN: IV                                                                            MAX PRED HR: 165                85%: 140            75%: 124                                                                                                                   RESTING BP: 120/90     RESTING HR: 77    PEAK BP: 149/83       PEAK HR: 84  EXERCISE DURATION:    4 min injection                                            REASON FOR TEST TERMINATION:    Protocol end                                                                                                                              SYMPTOMS:   None                                                                                                                                                                                                           EKG RESULTS: NSR. 78/min. Non specific ST/T changes, no changes with persantine.                                                              IMAGE QUALITY:  Good  PERFUSION/WALL MOTION FINDINGS:  EF = 73%. Large severe fixed basal inferior, mid inferior, and apical inferior. Inferior hypokinesis.                                                                         IMPRESSION:  Possible infarction in the RCA territory with normal LVEF, advise further evaluation.                                                                                                                                                                                                                                                                                       Adrian Blackwater, MD Stress Interpreting Physician / Nuclear Interpreting Physician        Adrian Blackwater MD  Electronically signed by: Adrian Blackwater     Date: 02/28/2019 14:03   ASSESSMENT AND PLAN:    ICD-10-CM   1. Primary hypertension  I10 PCV ECHOCARDIOGRAM COMPLETE    2. Shortness of breath  R06.02        Problem List Items Addressed This Visit       Cardiovascular and Mediastinum   Hypertension - Primary    Controlled today. Continue same medications.       Relevant Orders   PCV ECHOCARDIOGRAM COMPLETE     Other   Shortness of breath    Echo to check heart function.         Disposition:   Return in about 4 weeks (around 02/10/2023) for after echo.    Total time spent: 30 minutes  Signed,  Adrian Blackwater, MD  01/13/2023 10:10 AM    [facility]

## 2023-01-14 ENCOUNTER — Ambulatory Visit (INDEPENDENT_AMBULATORY_CARE_PROVIDER_SITE_OTHER): Payer: Medicare HMO | Admitting: Nurse Practitioner

## 2023-01-14 DIAGNOSIS — G479 Sleep disorder, unspecified: Secondary | ICD-10-CM | POA: Diagnosis not present

## 2023-01-14 DIAGNOSIS — R7303 Prediabetes: Secondary | ICD-10-CM

## 2023-01-14 DIAGNOSIS — E782 Mixed hyperlipidemia: Secondary | ICD-10-CM | POA: Diagnosis not present

## 2023-01-14 DIAGNOSIS — M25561 Pain in right knee: Secondary | ICD-10-CM | POA: Diagnosis not present

## 2023-01-14 DIAGNOSIS — G8929 Other chronic pain: Secondary | ICD-10-CM

## 2023-01-14 LAB — LIPID PANEL
Chol/HDL Ratio: 2.1 ratio (ref 0.0–4.4)
Cholesterol, Total: 146 mg/dL (ref 100–199)
HDL: 68 mg/dL (ref >39)
LDL Chol Calc (NIH): 62 mg/dL (ref 0–99)
Triglycerides: 83 mg/dL (ref 0–149)
VLDL Cholesterol Cal: 16 mg/dL (ref 5–40)

## 2023-01-14 LAB — CMP14+EGFR
ALT: 15 IU/L (ref 0–32)
AST: 16 IU/L (ref 0–40)
Albumin/Globulin Ratio: 2 (ref 1.2–2.2)
Albumin: 4.4 g/dL (ref 3.8–4.9)
Alkaline Phosphatase: 55 IU/L (ref 44–121)
BUN/Creatinine Ratio: 9 (ref 9–23)
BUN: 10 mg/dL (ref 6–24)
Bilirubin Total: 0.6 mg/dL (ref 0.0–1.2)
CO2: 25 mmol/L (ref 20–29)
Calcium: 9.5 mg/dL (ref 8.7–10.2)
Chloride: 102 mmol/L (ref 96–106)
Creatinine, Ser: 1.09 mg/dL — ABNORMAL HIGH (ref 0.57–1.00)
Globulin, Total: 2.2 g/dL (ref 1.5–4.5)
Glucose: 108 mg/dL — ABNORMAL HIGH (ref 70–99)
Potassium: 4.3 mmol/L (ref 3.5–5.2)
Sodium: 141 mmol/L (ref 134–144)
Total Protein: 6.6 g/dL (ref 6.0–8.5)
eGFR: 59 mL/min/{1.73_m2} — ABNORMAL LOW (ref >59)

## 2023-01-14 LAB — TSH: TSH: 2.04 u[IU]/mL (ref 0.450–4.500)

## 2023-01-14 LAB — HEMOGLOBIN A1C
Est. average glucose Bld gHb Est-mCnc: 128 mg/dL
Hgb A1c MFr Bld: 6.1 % — ABNORMAL HIGH (ref 4.8–5.6)

## 2023-01-14 MED ORDER — ZEPBOUND 2.5 MG/0.5ML ~~LOC~~ SOAJ
2.5000 mg | SUBCUTANEOUS | 0 refills | Status: DC
Start: 2023-01-14 — End: 2023-05-02

## 2023-01-14 NOTE — Patient Instructions (Signed)
1) Zepbound for weight loss due to knee pain 2) Follow up appt in 3 months, 2 weeks, fasting labs prior if able 3) Drink more water 4) Less sugar/carbs

## 2023-01-14 NOTE — Progress Notes (Signed)
Established Patient Office Visit  Subjective:  Patient ID: Lindsay Cooke, female    DOB: 04-23-1961  Age: 62 y.o. MRN: [id number]  Chief Complaint  Patient presents with   Follow-up    Follow Up & Med Reconciliation     Medication reconciliation.  Discussed knee pain with obesity, averaging 320 lbs in the last 3 months.  Zepbound candidate.  LDL is at goal, A1c is at 7.8% and eGFR is at 59.      No other concerns at this time.   Past Medical History:  Diagnosis Date   Allergy    Anemia    Asthma    Benign essential hypertension    GERD (gastroesophageal reflux disease)    Hyperlipidemia    Lumbar spondylosis    Obesity    Reflux    Right leg DVT (HCC)    Stroke [facility])     Past Surgical History:  Procedure Laterality Date   ABDOMINAL HYSTERECTOMY     CHOLECYSTECTOMY     COLONOSCOPY WITH PROPOFOL N/A 06/16/2017   Procedure: COLONOSCOPY WITH PROPOFOL;  Surgeon: Toney Reil, MD;  Location: [facility] ENDOSCOPY;  Service: Gastroenterology;  Laterality: N/A;   COLONOSCOPY WITH PROPOFOL N/A 11/10/2020   Procedure: COLONOSCOPY WITH PROPOFOL;  Surgeon: Toney Reil, MD;  Location: [facility] ENDOSCOPY;  Service: Gastroenterology;  Laterality: N/A;   HEEL SPUR EXCISION     HERNIA REPAIR     KELOID EXCISION      Social History   Socioeconomic History   Marital status: Single    Spouse name: Not on file   Number of children: Not on file   Years of education: Not on file   Highest education level: Not on file  Occupational History   Not on file  Tobacco Use   Smoking status: Never   Smokeless tobacco: Never  Vaping Use   Vaping Use: Never used  Substance and Sexual Activity   Alcohol use: No    Alcohol/week: 0.0 standard drinks of alcohol   Drug use: No   Sexual activity: Never  Other Topics Concern   Not on file  Social History Narrative   Not on file   Social Determinants of Health   Financial Resource Strain: Not on file  Food Insecurity: Not on file   Transportation Needs: Not on file  Physical Activity: Not on file  Stress: Not on file  Social Connections: Not on file  Intimate Partner Violence: Not on file    Family History  Problem Relation Age of Onset   Breast cancer Sister    Heart disease Mother    Congestive Heart Failure Sister     Allergies  Allergen Reactions   Penicillins Hives and Rash    Has patient had a PCN reaction causing immediate rash, facial/tongue/throat swelling, SOB or lightheadedness with hypotension: Yes Has patient had a PCN reaction causing severe rash involving mucus membranes or skin necrosis: No Has patient had a PCN reaction that required hospitalization No Has patient had a PCN reaction occurring within the last 10 years: No If all of the above answers are "NO", then may proceed with Cephalosporin use.   Metronidazole Rash    Other reaction(s): Skin Rashes, Hives    Review of Systems  Constitutional: Negative.   HENT: Negative.    Eyes: Negative.   Respiratory: Negative.    Cardiovascular: Negative.   Gastrointestinal: Negative.   Genitourinary: Negative.   Musculoskeletal:  Positive for joint pain and myalgias.  Skin:  Negative.   Neurological: Negative.   Endo/Heme/Allergies: Negative.   Psychiatric/Behavioral:  Positive for depression. The patient has insomnia.        Objective:   BP (!) 152/88   Pulse 92   Ht 5' (1.524 m)   Wt (!) 324 lb (147 kg)   SpO2 95%   BMI 63.28 kg/m   Vitals:   01/14/23 0953  BP: (!) 152/88  Pulse: 92  Height: 5' (1.524 m)  Weight: (!) 324 lb (147 kg)  SpO2: 95%  BMI (Calculated): 63.28    Physical Exam Vitals reviewed.  Constitutional:      Appearance: Normal appearance.  HENT:     Head: Normocephalic.     Nose: Nose normal.     Mouth/Throat:     Mouth: Mucous membranes are moist.  Eyes:     Pupils: Pupils are equal, round, and reactive to light.  Cardiovascular:     Rate and Rhythm: Normal rate and regular rhythm.   Pulmonary:     Effort: Pulmonary effort is normal.     Breath sounds: Normal breath sounds.  Abdominal:     General: Bowel sounds are normal.     Palpations: Abdomen is soft.  Musculoskeletal:        General: Swelling and tenderness present.     Cervical back: Normal range of motion and neck supple.  Skin:    General: Skin is warm and dry.  Neurological:     Mental Status: She is alert and oriented to person, place, and time.  Psychiatric:        Mood and Affect: Mood normal.        Behavior: Behavior normal.      No results found for any visits on 01/14/23.  Recent Results (from the past 2160 hour(s))  Hemoglobin A1c     Status: Abnormal   Collection Time: 01/13/23 10:13 AM  Result Value Ref Range   Hgb A1c MFr Bld 6.1 (H) 4.8 - 5.6 %    Comment:          Prediabetes: 5.7 - 6.4          Diabetes: >6.4          Glycemic control for adults with diabetes: <7.0    Est. average glucose Bld gHb Est-mCnc 128 mg/dL  TSH     Status: None   Collection Time: 01/13/23 10:13 AM  Result Value Ref Range   TSH 2.040 0.450 - 4.500 uIU/mL  CMP14+EGFR     Status: Abnormal   Collection Time: 01/13/23 10:13 AM  Result Value Ref Range   Glucose 108 (H) 70 - 99 mg/dL   BUN 10 6 - 24 mg/dL   Creatinine, Ser 1.61 (H) 0.57 - 1.00 mg/dL   eGFR 59 (L) >09 UE/AVW/0.98   BUN/Creatinine Ratio 9 9 - 23   Sodium 141 134 - 144 mmol/L   Potassium 4.3 3.5 - 5.2 mmol/L   Chloride 102 96 - 106 mmol/L   CO2 25 20 - 29 mmol/L   Calcium 9.5 8.7 - 10.2 mg/dL   Total Protein 6.6 6.0 - 8.5 g/dL   Albumin 4.4 3.8 - 4.9 g/dL   Globulin, Total 2.2 1.5 - 4.5 g/dL   Albumin/Globulin Ratio 2.0 1.2 - 2.2   Bilirubin Total 0.6 0.0 - 1.2 mg/dL   Alkaline Phosphatase 55 44 - 121 IU/L   AST 16 0 - 40 IU/L   ALT 15 0 - 32 IU/L  Lipid panel  Status: None   Collection Time: 01/13/23 10:13 AM  Result Value Ref Range   Cholesterol, Total 146 100 - 199 mg/dL   Triglycerides 83 0 - 149 mg/dL   HDL 68 >16 mg/dL    VLDL Cholesterol Cal 16 5 - 40 mg/dL   LDL Chol Calc (NIH) 62 0 - 99 mg/dL   Chol/HDL Ratio 2.1 0.0 - 4.4 ratio    Comment:                                   T. Chol/HDL Ratio                                             Men  Women                               1/2 Avg.Risk  3.4    3.3                                   Avg.Risk  5.0    4.4                                2X Avg.Risk  9.6    7.1                                3X Avg.Risk 23.4   11.0       Assessment & Plan:   Problem List Items Addressed This Visit       Other   Morbid obesity (HCC) - Primary   Relevant Medications   tirzepatide (ZEPBOUND) 2.5 MG/0.5ML Pen    No follow-ups on file.   Total time spent: 35 minutes  Orson Eva, NP  01/14/2023

## 2023-01-17 ENCOUNTER — Other Ambulatory Visit: Payer: Self-pay | Admitting: Nurse Practitioner

## 2023-01-18 ENCOUNTER — Other Ambulatory Visit: Payer: Self-pay | Admitting: Nurse Practitioner

## 2023-01-18 ENCOUNTER — Telehealth: Payer: Self-pay | Admitting: Nurse Practitioner

## 2023-01-18 MED ORDER — ZOLPIDEM TARTRATE ER 6.25 MG PO TBCR: EXTENDED_RELEASE_TABLET | ORAL | 1 refills | Status: DC

## 2023-01-18 NOTE — Telephone Encounter (Signed)
Patient called requesting call back from [place]

## 2023-01-19 ENCOUNTER — Telehealth: Payer: Self-pay | Admitting: Nurse Practitioner

## 2023-01-19 ENCOUNTER — Other Ambulatory Visit: Payer: Self-pay | Admitting: Nurse Practitioner

## 2023-01-19 DIAGNOSIS — Z8673 Personal history of transient ischemic attack (TIA), and cerebral infarction without residual deficits: Secondary | ICD-10-CM | POA: Diagnosis not present

## 2023-01-19 DIAGNOSIS — G8929 Other chronic pain: Secondary | ICD-10-CM | POA: Diagnosis not present

## 2023-01-19 DIAGNOSIS — M25561 Pain in right knee: Secondary | ICD-10-CM | POA: Diagnosis not present

## 2023-01-19 DIAGNOSIS — G4733 Obstructive sleep apnea (adult) (pediatric): Secondary | ICD-10-CM | POA: Diagnosis not present

## 2023-01-19 NOTE — Telephone Encounter (Signed)
Marchelle Folks at [facility] left VM that they need new Rx's for escitalopram 10 mg and esomeprazole 40 mg for patient so that they can do her bubble packs. Please send ASAP as patient is going to be without her meds due to not having refills.

## 2023-01-19 NOTE — Telephone Encounter (Signed)
Patient was calling about two refills and asked about the Zepbound, patient informed I have not received anything from them as of yet

## 2023-01-20 ENCOUNTER — Other Ambulatory Visit: Payer: Self-pay | Admitting: Nurse Practitioner

## 2023-01-20 MED ORDER — ESCITALOPRAM OXALATE 10 MG PO TABS: 10.0000 mg | ORAL_TABLET | Freq: Every day | ORAL | 3 refills | Status: DC

## 2023-01-20 MED ORDER — OMEPRAZOLE 40 MG PO CPDR: 40.0000 mg | DELAYED_RELEASE_CAPSULE | Freq: Every day | ORAL | 3 refills | Status: DC

## 2023-02-02 DIAGNOSIS — J449 Chronic obstructive pulmonary disease, unspecified: Secondary | ICD-10-CM | POA: Diagnosis not present

## 2023-02-04 DIAGNOSIS — J449 Chronic obstructive pulmonary disease, unspecified: Secondary | ICD-10-CM | POA: Diagnosis not present

## 2023-02-04 DIAGNOSIS — M1990 Unspecified osteoarthritis, unspecified site: Secondary | ICD-10-CM | POA: Diagnosis not present

## 2023-02-04 DIAGNOSIS — I10 Essential (primary) hypertension: Secondary | ICD-10-CM | POA: Diagnosis not present

## 2023-02-14 ENCOUNTER — Other Ambulatory Visit: Payer: Self-pay | Admitting: Nurse Practitioner

## 2023-02-15 ENCOUNTER — Other Ambulatory Visit: Payer: Self-pay | Admitting: Nurse Practitioner

## 2023-03-02 ENCOUNTER — Other Ambulatory Visit: Payer: Self-pay

## 2023-03-02 MED ORDER — MONTELUKAST SODIUM 10 MG PO TABS: 10.0000 mg | ORAL_TABLET | Freq: Every day | ORAL | 1 refills | Status: DC

## 2023-03-04 ENCOUNTER — Ambulatory Visit: Payer: Medicare HMO | Admitting: Nurse Practitioner

## 2023-03-08 ENCOUNTER — Other Ambulatory Visit: Payer: Self-pay

## 2023-03-08 MED ORDER — CLOPIDOGREL BISULFATE 75 MG PO TABS: 75.0000 mg | ORAL_TABLET | Freq: Every day | ORAL | 3 refills | Status: AC

## 2023-03-08 MED ORDER — ATORVASTATIN CALCIUM 20 MG PO TABS: 20.0000 mg | ORAL_TABLET | Freq: Every evening | ORAL | 3 refills | Status: DC

## 2023-03-08 MED ORDER — LOSARTAN POTASSIUM 100 MG PO TABS: 100.0000 mg | ORAL_TABLET | Freq: Every day | ORAL | 3 refills | Status: DC

## 2023-03-08 MED ORDER — ESCITALOPRAM OXALATE 10 MG PO TABS: 10.0000 mg | ORAL_TABLET | Freq: Every day | ORAL | 3 refills | Status: DC

## 2023-03-08 MED ORDER — OMEPRAZOLE 40 MG PO CPDR: 40.0000 mg | DELAYED_RELEASE_CAPSULE | Freq: Every day | ORAL | 3 refills | Status: DC

## 2023-03-08 MED ORDER — POTASSIUM CHLORIDE CRYS ER 20 MEQ PO TBCR: 20.0000 meq | EXTENDED_RELEASE_TABLET | Freq: Two times a day (BID) | ORAL | 3 refills | Status: DC

## 2023-03-11 ENCOUNTER — Other Ambulatory Visit: Payer: Self-pay | Admitting: Nurse Practitioner

## 2023-03-11 DIAGNOSIS — J449 Chronic obstructive pulmonary disease, unspecified: Secondary | ICD-10-CM | POA: Diagnosis not present

## 2023-03-25 ENCOUNTER — Other Ambulatory Visit: Payer: Self-pay

## 2023-03-27 MED ORDER — ZOLPIDEM TARTRATE ER 6.25 MG PO TBCR: EXTENDED_RELEASE_TABLET | ORAL | 1 refills | Status: DC

## 2023-03-29 ENCOUNTER — Telehealth: Payer: Self-pay | Admitting: Nurse Practitioner

## 2023-03-29 NOTE — Telephone Encounter (Signed)
Patient called wanting status of Ambien. Advised patient it required a PA which was approved and she just needs to call pharmacy for them to fill it.

## 2023-04-04 ENCOUNTER — Other Ambulatory Visit: Payer: Self-pay

## 2023-04-04 MED ORDER — CETIRIZINE HCL 10 MG PO TABS: 10.0000 mg | ORAL_TABLET | Freq: Every day | ORAL | 3 refills | Status: DC

## 2023-04-15 ENCOUNTER — Telehealth: Payer: Self-pay

## 2023-04-18 ENCOUNTER — Ambulatory Visit: Payer: Medicare HMO | Admitting: Cardiology

## 2023-04-22 ENCOUNTER — Other Ambulatory Visit: Payer: Self-pay | Admitting: Cardiology

## 2023-04-22 ENCOUNTER — Telehealth: Payer: Self-pay

## 2023-04-22 ENCOUNTER — Other Ambulatory Visit: Payer: Self-pay

## 2023-04-22 MED ORDER — NIRMATRELVIR/RITONAVIR (PAXLOVID)TABLET: 3.0000 | ORAL_TABLET | Freq: Two times a day (BID) | ORAL | 0 refills | Status: AC

## 2023-04-22 MED ORDER — NIRMATRELVIR/RITONAVIR (PAXLOVID)TABLET: 3.0000 | ORAL_TABLET | Freq: Two times a day (BID) | ORAL | 0 refills | Status: DC

## 2023-04-22 NOTE — Telephone Encounter (Signed)
Dawn from [facility] called and they are out of Paxlovid - need to find out what other pharmacy patient would like this sent to.

## 2023-04-25 ENCOUNTER — Ambulatory Visit: Payer: Medicare HMO | Admitting: Cardiology

## 2023-04-27 ENCOUNTER — Other Ambulatory Visit: Payer: Self-pay | Admitting: Cardiology

## 2023-04-27 ENCOUNTER — Other Ambulatory Visit: Payer: Medicare HMO | Admitting: Cardiology

## 2023-04-27 ENCOUNTER — Telehealth: Payer: Self-pay | Admitting: Cardiology

## 2023-04-27 DIAGNOSIS — E782 Mixed hyperlipidemia: Secondary | ICD-10-CM | POA: Diagnosis not present

## 2023-04-27 DIAGNOSIS — Z1329 Encounter for screening for other suspected endocrine disorder: Secondary | ICD-10-CM | POA: Diagnosis not present

## 2023-04-27 DIAGNOSIS — I10 Essential (primary) hypertension: Secondary | ICD-10-CM | POA: Diagnosis not present

## 2023-04-27 DIAGNOSIS — R7303 Prediabetes: Secondary | ICD-10-CM | POA: Diagnosis not present

## 2023-04-27 NOTE — Telephone Encounter (Signed)
Patient left VM requesting a call back. Did not state what she needs.

## 2023-04-28 LAB — CMP14+EGFR
ALT: 20 IU/L (ref 0–32)
AST: 17 IU/L (ref 0–40)
Albumin: 4.2 g/dL (ref 3.8–4.9)
Alkaline Phosphatase: 58 IU/L (ref 44–121)
BUN/Creatinine Ratio: 8 — ABNORMAL LOW (ref 9–23)
BUN: 11 mg/dL (ref 6–24)
Bilirubin Total: 0.4 mg/dL (ref 0.0–1.2)
CO2: 27 mmol/L (ref 20–29)
Calcium: 9.1 mg/dL (ref 8.7–10.2)
Chloride: 99 mmol/L (ref 96–106)
Creatinine, Ser: 1.3 mg/dL — ABNORMAL HIGH (ref 0.57–1.00)
Globulin, Total: 2.5 g/dL (ref 1.5–4.5)
Glucose: 103 mg/dL — ABNORMAL HIGH (ref 70–99)
Potassium: 4.3 mmol/L (ref 3.5–5.2)
Sodium: 140 mmol/L (ref 134–144)
Total Protein: 6.7 g/dL (ref 6.0–8.5)
eGFR: 47 mL/min/{1.73_m2} — ABNORMAL LOW (ref >59)

## 2023-04-28 LAB — LIPID PANEL
Chol/HDL Ratio: 3.3 ratio (ref 0.0–4.4)
Cholesterol, Total: 158 mg/dL (ref 100–199)
HDL: 48 mg/dL (ref >39)
LDL Chol Calc (NIH): 91 mg/dL (ref 0–99)
Triglycerides: 106 mg/dL (ref 0–149)
VLDL Cholesterol Cal: 19 mg/dL (ref 5–40)

## 2023-04-28 LAB — TSH: TSH: 1.28 u[IU]/mL (ref 0.450–4.500)

## 2023-04-28 LAB — HEMOGLOBIN A1C
Est. average glucose Bld gHb Est-mCnc: 131 mg/dL
Hgb A1c MFr Bld: 6.2 % — ABNORMAL HIGH (ref 4.8–5.6)

## 2023-05-02 ENCOUNTER — Encounter: Payer: Self-pay | Admitting: Cardiology

## 2023-05-02 ENCOUNTER — Ambulatory Visit (INDEPENDENT_AMBULATORY_CARE_PROVIDER_SITE_OTHER): Payer: Medicare HMO | Admitting: Cardiology

## 2023-05-02 VITALS — BP 108/74 | Ht 61.0 in | Wt 320.0 lb

## 2023-05-02 DIAGNOSIS — E782 Mixed hyperlipidemia: Secondary | ICD-10-CM

## 2023-05-02 DIAGNOSIS — I10 Essential (primary) hypertension: Secondary | ICD-10-CM

## 2023-05-02 MED ORDER — BENZONATATE 100 MG PO CAPS: 100.0000 mg | ORAL_CAPSULE | Freq: Three times a day (TID) | ORAL | 1 refills | Status: DC | PRN

## 2023-05-02 MED ORDER — FLUTICASONE PROPIONATE 50 MCG/ACT NA SUSP: 1.0000 | Freq: Every day | NASAL | 11 refills | Status: DC

## 2023-05-02 MED ORDER — GUAIFENESIN ER 600 MG PO TB12
600.0000 mg | ORAL_TABLET | Freq: Two times a day (BID) | ORAL | 2 refills | Status: DC
Start: 2023-05-02 — End: 2023-08-16

## 2023-05-02 NOTE — Progress Notes (Signed)
Established Patient Office Visit  Subjective:  Patient ID: Lindsay Cooke, female    DOB: 1960/09/17  Age: 62 y.o. MRN: [id number]  Chief Complaint  Patient presents with   Follow-up    3 month follow up, discuss lab results.    Patient in office for 3 month follow up, discuss recent lab results. Patient complaining of a cough that started about 10 days ago, Was taking Robitussin with no relief. Taking Zyrtec and Singulair daily. Will send in Flonase, tessalon and muccinex.  Patient's Hgb A1c elevated, Reports drinking 2 ginger ale daily. Will decrease or change to diet. Kidney function decreased, increase water intake.  Up to date on her health maintenance exams.  Cough This is a new problem. The current episode started 1 to 4 weeks ago. The problem has been gradually worsening. The cough is Productive of sputum. Associated symptoms include nasal congestion, postnasal drip and rhinorrhea. Pertinent negatives include no chest pain, ear pain, fever, headaches, myalgias, sore throat or shortness of breath. She has tried OTC cough suppressant for the symptoms. The treatment provided no relief. Her past medical history is significant for asthma.    No other concerns at this time.   Past Medical History:  Diagnosis Date   Allergy    Anemia    Asthma    Benign essential hypertension    GERD (gastroesophageal reflux disease)    Hyperlipidemia    Lumbar spondylosis    Obesity    Reflux    Right leg DVT (HCC)    Stroke [facility])     Past Surgical History:  Procedure Laterality Date   ABDOMINAL HYSTERECTOMY     CHOLECYSTECTOMY     COLONOSCOPY WITH PROPOFOL N/A 06/16/2017   Procedure: COLONOSCOPY WITH PROPOFOL;  Surgeon: Toney Reil, MD;  Location: [facility] ENDOSCOPY;  Service: Gastroenterology;  Laterality: N/A;   COLONOSCOPY WITH PROPOFOL N/A 11/10/2020   Procedure: COLONOSCOPY WITH PROPOFOL;  Surgeon: Toney Reil, MD;  Location: [facility] ENDOSCOPY;  Service: Gastroenterology;   Laterality: N/A;   HEEL SPUR EXCISION     HERNIA REPAIR     KELOID EXCISION      Social History   Socioeconomic History   Marital status: Single    Spouse name: Not on file   Number of children: Not on file   Years of education: Not on file   Highest education level: Not on file  Occupational History   Not on file  Tobacco Use   Smoking status: Never   Smokeless tobacco: Never  Vaping Use   Vaping status: Never Used  Substance and Sexual Activity   Alcohol use: No    Alcohol/week: 0.0 standard drinks of alcohol   Drug use: No   Sexual activity: Never  Other Topics Concern   Not on file  Social History Narrative   Not on file   Social Determinants of Health   Financial Resource Strain: Not on file  Food Insecurity: Not on file  Transportation Needs: Not on file  Physical Activity: Not on file  Stress: Not on file  Social Connections: Not on file  Intimate Partner Violence: Not on file    Family History  Problem Relation Age of Onset   Breast cancer Sister    Heart disease Mother    Congestive Heart Failure Sister     Allergies  Allergen Reactions   Penicillins Hives and Rash    Has patient had a PCN reaction causing immediate rash, facial/tongue/throat swelling, SOB or lightheadedness  with hypotension: Yes Has patient had a PCN reaction causing severe rash involving mucus membranes or skin necrosis: No Has patient had a PCN reaction that required hospitalization No Has patient had a PCN reaction occurring within the last 10 years: No If all of the above answers are "NO", then may proceed with Cephalosporin use.   Metronidazole Rash    Other reaction(s): Skin Rashes, Hives    Review of Systems  Constitutional: Negative.  Negative for fever.  HENT:  Positive for congestion, postnasal drip and rhinorrhea. Negative for ear pain and sore throat.   Eyes: Negative.   Respiratory:  Positive for cough and sputum production. Negative for shortness of breath.    Cardiovascular: Negative.  Negative for chest pain.  Gastrointestinal: Negative.  Negative for abdominal pain, constipation and diarrhea.  Genitourinary: Negative.   Musculoskeletal:  Negative for joint pain and myalgias.  Skin: Negative.   Neurological: Negative.  Negative for dizziness and headaches.  Endo/Heme/Allergies: Negative.   All other systems reviewed and are negative.      Objective:   BP 108/74   Ht 5\' 1"  (1.549 m)   Wt (!) 320 lb (145.2 kg)   BMI 60.46 kg/m   Vitals:   05/02/23 1122  BP: 108/74  Height: 5\' 1"  (1.549 m)  Weight: (!) 320 lb (145.2 kg)  BMI (Calculated): 60.49    Physical Exam Vitals and nursing note reviewed.  Constitutional:      Appearance: Normal appearance. She is normal weight.  HENT:     Head: Normocephalic and atraumatic.     Nose: Nose normal.     Mouth/Throat:     Mouth: Mucous membranes are moist.  Eyes:     Extraocular Movements: Extraocular movements intact.     Conjunctiva/sclera: Conjunctivae normal.     Pupils: Pupils are equal, round, and reactive to light.  Cardiovascular:     Rate and Rhythm: Normal rate and regular rhythm.     Pulses: Normal pulses.     Heart sounds: Normal heart sounds.  Pulmonary:     Effort: Pulmonary effort is normal.     Breath sounds: Normal breath sounds.  Abdominal:     General: Abdomen is flat. Bowel sounds are normal.     Palpations: Abdomen is soft.  Musculoskeletal:        General: Normal range of motion.     Cervical back: Normal range of motion.  Skin:    General: Skin is warm and dry.  Neurological:     General: No focal deficit present.     Mental Status: She is alert and oriented to person, place, and time.  Psychiatric:        Mood and Affect: Mood normal.        Behavior: Behavior normal.        Thought Content: Thought content normal.        Judgment: Judgment normal.      No results found for any visits on 05/02/23.  Recent Results (from the past 2160 hour(s))   Hemoglobin A1c     Status: Abnormal   Collection Time: 04/27/23 11:59 AM  Result Value Ref Range   Hgb A1c MFr Bld 6.2 (H) 4.8 - 5.6 %    Comment:          Prediabetes: 5.7 - 6.4          Diabetes: >6.4          Glycemic control for adults with diabetes: <7.0  Est. average glucose Bld gHb Est-mCnc 131 mg/dL  TSH     Status: None   Collection Time: 04/27/23 11:59 AM  Result Value Ref Range   TSH 1.280 0.450 - 4.500 uIU/mL  CMP14+EGFR     Status: Abnormal   Collection Time: 04/27/23 11:59 AM  Result Value Ref Range   Glucose 103 (H) 70 - 99 mg/dL   BUN 11 6 - 24 mg/dL   Creatinine, Ser 1.61 (H) 0.57 - 1.00 mg/dL   eGFR 47 (L) >09 UE/AVW/0.98   BUN/Creatinine Ratio 8 (L) 9 - 23   Sodium 140 134 - 144 mmol/L   Potassium 4.3 3.5 - 5.2 mmol/L   Chloride 99 96 - 106 mmol/L   CO2 27 20 - 29 mmol/L   Calcium 9.1 8.7 - 10.2 mg/dL   Total Protein 6.7 6.0 - 8.5 g/dL   Albumin 4.2 3.8 - 4.9 g/dL   Globulin, Total 2.5 1.5 - 4.5 g/dL   Bilirubin Total 0.4 0.0 - 1.2 mg/dL   Alkaline Phosphatase 58 44 - 121 IU/L   AST 17 0 - 40 IU/L   ALT 20 0 - 32 IU/L  Lipid panel     Status: None   Collection Time: 04/27/23 11:59 AM  Result Value Ref Range   Cholesterol, Total 158 100 - 199 mg/dL   Triglycerides 119 0 - 149 mg/dL   HDL 48 >14 mg/dL   VLDL Cholesterol Cal 19 5 - 40 mg/dL   LDL Chol Calc (NIH) 91 0 - 99 mg/dL   Chol/HDL Ratio 3.3 0.0 - 4.4 ratio    Comment:                                   T. Chol/HDL Ratio                                             Men  Women                               1/2 Avg.Risk  3.4    3.3                                   Avg.Risk  5.0    4.4                                2X Avg.Risk  9.6    7.1                                3X Avg.Risk 23.4   11.0       Assessment & Plan:  Continue Zyrtec and Singulair. Tessalon, Flonase, and Mucinex sent to the pharmacy. Decrease sugary drink intake. Increase water intake.   Problem List Items Addressed  This Visit       Cardiovascular and Mediastinum   Hypertension - Primary     Other   Morbid obesity (HCC)   Hyperlipidemia    Return in about 3 months (around 08/02/2023) for with blood work prior.   Total time  spent: 25 minutes  [employer], NP  05/02/2023   This document may have been prepared by Dragon Voice Recognition software and as such may include unintentional dictation errors.

## 2023-05-10 NOTE — Telephone Encounter (Signed)
Pt was informed that Medicaid paperwork was faxed off and if any other information is requested by Medicaid we will f/u at that time .

## 2023-05-19 ENCOUNTER — Telehealth: Payer: Self-pay

## 2023-05-19 NOTE — Telephone Encounter (Signed)
Patient LM asking for call back 

## 2023-05-26 ENCOUNTER — Other Ambulatory Visit: Payer: Self-pay | Admitting: Family

## 2023-05-26 DIAGNOSIS — E113293 Type 2 diabetes mellitus with mild nonproliferative diabetic retinopathy without macular edema, bilateral: Secondary | ICD-10-CM | POA: Diagnosis not present

## 2023-05-26 DIAGNOSIS — H2513 Age-related nuclear cataract, bilateral: Secondary | ICD-10-CM | POA: Diagnosis not present

## 2023-05-26 DIAGNOSIS — H04123 Dry eye syndrome of bilateral lacrimal glands: Secondary | ICD-10-CM | POA: Diagnosis not present

## 2023-05-26 DIAGNOSIS — I10 Essential (primary) hypertension: Secondary | ICD-10-CM | POA: Diagnosis not present

## 2023-05-30 ENCOUNTER — Other Ambulatory Visit: Payer: Self-pay | Admitting: Cardiovascular Disease

## 2023-05-30 ENCOUNTER — Telehealth: Payer: Medicare HMO

## 2023-05-31 ENCOUNTER — Telehealth: Payer: Self-pay | Admitting: Cardiology

## 2023-05-31 NOTE — Telephone Encounter (Signed)
Patient left VM requesting a call back about getting her medications "turned over" to [facility].

## 2023-06-01 NOTE — Telephone Encounter (Signed)
Patient will call us back when she knows what prescriptions need to be refilled.

## 2023-06-02 DIAGNOSIS — J449 Chronic obstructive pulmonary disease, unspecified: Secondary | ICD-10-CM | POA: Diagnosis not present

## 2023-06-02 DIAGNOSIS — J45998 Other asthma: Secondary | ICD-10-CM | POA: Diagnosis not present

## 2023-06-09 ENCOUNTER — Ambulatory Visit: Payer: Medicare HMO

## 2023-06-27 ENCOUNTER — Ambulatory Visit: Payer: Medicare HMO | Admitting: Cardiology

## 2023-06-28 ENCOUNTER — Other Ambulatory Visit: Payer: Self-pay | Admitting: Internal Medicine

## 2023-06-30 ENCOUNTER — Other Ambulatory Visit: Payer: Self-pay

## 2023-07-04 ENCOUNTER — Encounter: Payer: Self-pay | Admitting: Cardiology

## 2023-07-04 ENCOUNTER — Ambulatory Visit (INDEPENDENT_AMBULATORY_CARE_PROVIDER_SITE_OTHER): Payer: Medicare HMO | Admitting: Cardiology

## 2023-07-04 DIAGNOSIS — R7303 Prediabetes: Secondary | ICD-10-CM | POA: Diagnosis not present

## 2023-07-04 DIAGNOSIS — Z23 Encounter for immunization: Secondary | ICD-10-CM | POA: Diagnosis not present

## 2023-07-04 DIAGNOSIS — M79672 Pain in left foot: Secondary | ICD-10-CM | POA: Diagnosis not present

## 2023-07-04 MED ORDER — WEGOVY 0.25 MG/0.5ML ~~LOC~~ SOAJ: 0.2500 mg | SUBCUTANEOUS | 3 refills | Status: DC

## 2023-07-04 NOTE — Progress Notes (Signed)
Established Patient Office Visit  Subjective:  Patient ID: Lindsay Cooke, female    DOB: Jun 27, 1961  Age: 62 y.o. MRN: [id number]  Chief Complaint  Patient presents with   Follow-up    Discuss weight loss & flu shot    Patient in office wanting to discuss weight loss. Patient has a past medical history of prediabetes, CAD and TIA. Patient is morbidly obese and would benefit from an injectable GLP-1.  Patient complaining of left foot pain, states she has a bone spur. Patient requesting a referral to podiatry. Referral placed.     No other concerns at this time.   Past Medical History:  Diagnosis Date   Allergy    Anemia    Asthma    Benign essential hypertension    GERD (gastroesophageal reflux disease)    Hyperlipidemia    Lumbar spondylosis    Obesity    Reflux    Right leg DVT (HCC)    Stroke [facility])     Past Surgical History:  Procedure Laterality Date   ABDOMINAL HYSTERECTOMY     CHOLECYSTECTOMY     COLONOSCOPY WITH PROPOFOL N/A 06/16/2017   Procedure: COLONOSCOPY WITH PROPOFOL;  Surgeon: Toney Reil, MD;  Location: [facility] ENDOSCOPY;  Service: Gastroenterology;  Laterality: N/A;   COLONOSCOPY WITH PROPOFOL N/A 11/10/2020   Procedure: COLONOSCOPY WITH PROPOFOL;  Surgeon: Toney Reil, MD;  Location: [facility] ENDOSCOPY;  Service: Gastroenterology;  Laterality: N/A;   HEEL SPUR EXCISION     HERNIA REPAIR     KELOID EXCISION      Social History   Socioeconomic History   Marital status: Single    Spouse name: Not on file   Number of children: Not on file   Years of education: Not on file   Highest education level: Not on file  Occupational History   Not on file  Tobacco Use   Smoking status: Never   Smokeless tobacco: Never  Vaping Use   Vaping status: Never Used  Substance and Sexual Activity   Alcohol use: No    Alcohol/week: 0.0 standard drinks of alcohol   Drug use: No   Sexual activity: Never  Other Topics Concern   Not on file  Social  History Narrative   Not on file   Social Determinants of Health   Financial Resource Strain: Not on file  Food Insecurity: Not on file  Transportation Needs: Not on file  Physical Activity: Not on file  Stress: Not on file  Social Connections: Not on file  Intimate Partner Violence: Not on file    Family History  Problem Relation Age of Onset   Breast cancer Sister    Heart disease Mother    Congestive Heart Failure Sister     Allergies  Allergen Reactions   Penicillins Hives and Rash    Has patient had a PCN reaction causing immediate rash, facial/tongue/throat swelling, SOB or lightheadedness with hypotension: Yes Has patient had a PCN reaction causing severe rash involving mucus membranes or skin necrosis: No Has patient had a PCN reaction that required hospitalization No Has patient had a PCN reaction occurring within the last 10 years: No If all of the above answers are "NO", then may proceed with Cephalosporin use.   Metronidazole Rash    Other reaction(s): Skin Rashes, Hives    Review of Systems  Constitutional: Negative.   HENT: Negative.    Eyes: Negative.   Respiratory: Negative.  Negative for shortness of breath.  Cardiovascular: Negative.  Negative for chest pain.  Gastrointestinal: Negative.  Negative for abdominal pain, constipation and diarrhea.  Genitourinary: Negative.   Musculoskeletal:  Negative for joint pain and myalgias.  Skin: Negative.   Neurological: Negative.  Negative for dizziness and headaches.  Endo/Heme/Allergies: Negative.   All other systems reviewed and are negative.      Objective:   BP (!) 130/90   Pulse 88   Ht 5\' 1"  (1.549 m)   Wt (!) 321 lb 6.4 oz (145.8 kg)   SpO2 98%   BMI 60.73 kg/m   Vitals:   07/04/23 0949  BP: (!) 130/90  Pulse: 88  Height: 5\' 1"  (1.549 m)  Weight: (!) 321 lb 6.4 oz (145.8 kg)  SpO2: 98%  BMI (Calculated): 60.76    Physical Exam Vitals and nursing note reviewed.  Constitutional:       Appearance: Normal appearance. She is normal weight.  HENT:     Head: Normocephalic and atraumatic.     Nose: Nose normal.     Mouth/Throat:     Mouth: Mucous membranes are moist.  Eyes:     Extraocular Movements: Extraocular movements intact.     Conjunctiva/sclera: Conjunctivae normal.     Pupils: Pupils are equal, round, and reactive to light.  Cardiovascular:     Rate and Rhythm: Normal rate and regular rhythm.     Pulses: Normal pulses.     Heart sounds: Normal heart sounds.  Pulmonary:     Effort: Pulmonary effort is normal.     Breath sounds: Normal breath sounds.  Abdominal:     General: Abdomen is flat. Bowel sounds are normal.     Palpations: Abdomen is soft.  Musculoskeletal:        General: Normal range of motion.     Cervical back: Normal range of motion.  Skin:    General: Skin is warm and dry.  Neurological:     General: No focal deficit present.     Mental Status: She is alert and oriented to person, place, and time.  Psychiatric:        Mood and Affect: Mood normal.        Behavior: Behavior normal.        Thought Content: Thought content normal.        Judgment: Judgment normal.      No results found for any visits on 07/04/23.  Recent Results (from the past 2160 hour(s))  Hemoglobin A1c     Status: Abnormal   Collection Time: 04/27/23 11:59 AM  Result Value Ref Range   Hgb A1c MFr Bld 6.2 (H) 4.8 - 5.6 %    Comment:          Prediabetes: 5.7 - 6.4          Diabetes: >6.4          Glycemic control for adults with diabetes: <7.0    Est. average glucose Bld gHb Est-mCnc 131 mg/dL  TSH     Status: None   Collection Time: 04/27/23 11:59 AM  Result Value Ref Range   TSH 1.280 0.450 - 4.500 uIU/mL  CMP14+EGFR     Status: Abnormal   Collection Time: 04/27/23 11:59 AM  Result Value Ref Range   Glucose 103 (H) 70 - 99 mg/dL   BUN 11 6 - 24 mg/dL   Creatinine, Ser 7.82 (H) 0.57 - 1.00 mg/dL   eGFR 47 (L) >95 AO/ZHY/8.65   BUN/Creatinine Ratio 8 (L)  9 -  23   Sodium 140 134 - 144 mmol/L   Potassium 4.3 3.5 - 5.2 mmol/L   Chloride 99 96 - 106 mmol/L   CO2 27 20 - 29 mmol/L   Calcium 9.1 8.7 - 10.2 mg/dL   Total Protein 6.7 6.0 - 8.5 g/dL   Albumin 4.2 3.8 - 4.9 g/dL   Globulin, Total 2.5 1.5 - 4.5 g/dL   Bilirubin Total 0.4 0.0 - 1.2 mg/dL   Alkaline Phosphatase 58 44 - 121 IU/L   AST 17 0 - 40 IU/L   ALT 20 0 - 32 IU/L  Lipid panel     Status: None   Collection Time: 04/27/23 11:59 AM  Result Value Ref Range   Cholesterol, Total 158 100 - 199 mg/dL   Triglycerides 034 0 - 149 mg/dL   HDL 48 >74 mg/dL   VLDL Cholesterol Cal 19 5 - 40 mg/dL   LDL Chol Calc (NIH) 91 0 - 99 mg/dL   Chol/HDL Ratio 3.3 0.0 - 4.4 ratio    Comment:                                   T. Chol/HDL Ratio                                             Men  Women                               1/2 Avg.Risk  3.4    3.3                                   Avg.Risk  5.0    4.4                                2X Avg.Risk  9.6    7.1                                3X Avg.Risk 23.4   11.0       Assessment & Plan:  Wegovy to aid in weight loss.  Referral sent to podiatry.   Problem List Items Addressed This Visit       Other   Morbid obesity (HCC) - Primary   Relevant Medications   Semaglutide-Weight Management (WEGOVY) 0.25 MG/0.5ML SOAJ   Pre-diabetes   Other Visit Diagnoses     Need for immunization against influenza       Relevant Orders   Flu vaccine trivalent PF, 6mos and older(Flulaval,Afluria,Fluarix,Fluzone)   Left foot pain       Relevant Orders   Ambulatory referral to Podiatry       Return in about 6 weeks (around 08/15/2023).   Total time spent: 25 minutes  [employer], NP  07/04/2023   This document may have been prepared by Dragon Voice Recognition software and as such may include unintentional dictation errors.

## 2023-07-05 ENCOUNTER — Telehealth: Payer: Self-pay

## 2023-07-05 NOTE — Telephone Encounter (Signed)
Pt LM asking for call  back but didn't state what she needed in the message

## 2023-07-11 DIAGNOSIS — M216X2 Other acquired deformities of left foot: Secondary | ICD-10-CM | POA: Diagnosis not present

## 2023-07-11 DIAGNOSIS — E66813 Obesity, class 3: Secondary | ICD-10-CM | POA: Diagnosis not present

## 2023-07-11 DIAGNOSIS — M7662 Achilles tendinitis, left leg: Secondary | ICD-10-CM | POA: Diagnosis not present

## 2023-07-11 DIAGNOSIS — M216X1 Other acquired deformities of right foot: Secondary | ICD-10-CM | POA: Diagnosis not present

## 2023-07-11 DIAGNOSIS — M7732 Calcaneal spur, left foot: Secondary | ICD-10-CM | POA: Diagnosis not present

## 2023-07-11 DIAGNOSIS — M79672 Pain in left foot: Secondary | ICD-10-CM | POA: Diagnosis not present

## 2023-07-11 DIAGNOSIS — Z86718 Personal history of other venous thrombosis and embolism: Secondary | ICD-10-CM | POA: Diagnosis not present

## 2023-07-13 ENCOUNTER — Telehealth: Payer: Self-pay | Admitting: Cardiology

## 2023-07-13 NOTE — Telephone Encounter (Signed)
Patient left VM asking something about a shot I believe. I couldn't really understand her message. Need to call back and get more info.

## 2023-07-14 NOTE — Telephone Encounter (Signed)
Patient called in to check the status of her [facility]. Have you had a chance to do hers?

## 2023-07-20 ENCOUNTER — Other Ambulatory Visit: Payer: Self-pay

## 2023-07-20 MED ORDER — WEGOVY 0.25 MG/0.5ML ~~LOC~~ SOAJ: 0.2500 mg | SUBCUTANEOUS | 0 refills | Status: DC

## 2023-07-22 ENCOUNTER — Other Ambulatory Visit: Payer: Self-pay | Admitting: Internal Medicine

## 2023-07-22 DIAGNOSIS — K219 Gastro-esophageal reflux disease without esophagitis: Secondary | ICD-10-CM

## 2023-07-22 DIAGNOSIS — H2512 Age-related nuclear cataract, left eye: Secondary | ICD-10-CM | POA: Diagnosis not present

## 2023-07-22 DIAGNOSIS — H2513 Age-related nuclear cataract, bilateral: Secondary | ICD-10-CM | POA: Diagnosis not present

## 2023-07-22 MED ORDER — PANTOPRAZOLE SODIUM 40 MG PO TBEC
40.0000 mg | DELAYED_RELEASE_TABLET | Freq: Every day | ORAL | 1 refills | Status: DC
Start: 2023-07-22 — End: 2024-01-05

## 2023-08-02 ENCOUNTER — Ambulatory Visit: Payer: Medicare HMO | Admitting: Cardiology

## 2023-08-03 ENCOUNTER — Telehealth: Payer: Self-pay

## 2023-08-03 NOTE — Telephone Encounter (Signed)
Patient is requesting Lindsay Cooke to be called into [facility] on [address].

## 2023-08-08 ENCOUNTER — Other Ambulatory Visit: Payer: Self-pay | Admitting: Cardiology

## 2023-08-08 MED ORDER — WEGOVY 0.25 MG/0.5ML ~~LOC~~ SOAJ: 0.2500 mg | SUBCUTANEOUS | 0 refills | Status: DC

## 2023-08-10 DIAGNOSIS — M216X1 Other acquired deformities of right foot: Secondary | ICD-10-CM | POA: Diagnosis not present

## 2023-08-10 DIAGNOSIS — M7662 Achilles tendinitis, left leg: Secondary | ICD-10-CM | POA: Diagnosis not present

## 2023-08-10 DIAGNOSIS — Z6841 Body Mass Index (BMI) 40.0 and over, adult: Secondary | ICD-10-CM | POA: Diagnosis not present

## 2023-08-10 DIAGNOSIS — M79672 Pain in left foot: Secondary | ICD-10-CM | POA: Diagnosis not present

## 2023-08-10 DIAGNOSIS — M7732 Calcaneal spur, left foot: Secondary | ICD-10-CM | POA: Diagnosis not present

## 2023-08-10 DIAGNOSIS — M216X2 Other acquired deformities of left foot: Secondary | ICD-10-CM | POA: Diagnosis not present

## 2023-08-10 DIAGNOSIS — E66813 Obesity, class 3: Secondary | ICD-10-CM | POA: Diagnosis not present

## 2023-08-11 ENCOUNTER — Other Ambulatory Visit: Payer: Self-pay | Admitting: Podiatry

## 2023-08-11 ENCOUNTER — Telehealth: Payer: Self-pay | Admitting: Cardiology

## 2023-08-11 DIAGNOSIS — M7662 Achilles tendinitis, left leg: Secondary | ICD-10-CM

## 2023-08-11 DIAGNOSIS — M7732 Calcaneal spur, left foot: Secondary | ICD-10-CM

## 2023-08-11 DIAGNOSIS — M79672 Pain in left foot: Secondary | ICD-10-CM

## 2023-08-11 DIAGNOSIS — M216X1 Other acquired deformities of right foot: Secondary | ICD-10-CM

## 2023-08-11 NOTE — Telephone Encounter (Signed)
Patient needs Wegovy 0.5 mg sent to pharmacy. Please advise.  [facility] [address]

## 2023-08-12 ENCOUNTER — Other Ambulatory Visit: Payer: Self-pay

## 2023-08-12 ENCOUNTER — Other Ambulatory Visit: Payer: Medicare HMO

## 2023-08-12 ENCOUNTER — Other Ambulatory Visit: Payer: Self-pay | Admitting: Cardiology

## 2023-08-12 DIAGNOSIS — I10 Essential (primary) hypertension: Secondary | ICD-10-CM | POA: Diagnosis not present

## 2023-08-12 DIAGNOSIS — E782 Mixed hyperlipidemia: Secondary | ICD-10-CM

## 2023-08-12 DIAGNOSIS — R7303 Prediabetes: Secondary | ICD-10-CM

## 2023-08-12 DIAGNOSIS — Z1329 Encounter for screening for other suspected endocrine disorder: Secondary | ICD-10-CM

## 2023-08-12 MED ORDER — WEGOVY 0.25 MG/0.5ML ~~LOC~~ SOAJ: 0.2500 mg | SUBCUTANEOUS | 4 refills | Status: DC

## 2023-08-13 LAB — CMP14+EGFR
ALT: 21 [IU]/L (ref 0–32)
AST: 12 [IU]/L (ref 0–40)
Albumin: 4.4 g/dL (ref 3.8–4.9)
Alkaline Phosphatase: 64 [IU]/L (ref 44–121)
BUN/Creatinine Ratio: 10 — ABNORMAL LOW (ref 12–28)
BUN: 13 mg/dL (ref 8–27)
Bilirubin Total: 0.5 mg/dL (ref 0.0–1.2)
CO2: 25 mmol/L (ref 20–29)
Calcium: 9.5 mg/dL (ref 8.7–10.3)
Chloride: 104 mmol/L (ref 96–106)
Creatinine, Ser: 1.26 mg/dL — ABNORMAL HIGH (ref 0.57–1.00)
Globulin, Total: 2.5 g/dL (ref 1.5–4.5)
Glucose: 101 mg/dL — ABNORMAL HIGH (ref 70–99)
Potassium: 4.4 mmol/L (ref 3.5–5.2)
Sodium: 143 mmol/L (ref 134–144)
Total Protein: 6.9 g/dL (ref 6.0–8.5)
eGFR: 49 mL/min/{1.73_m2} — ABNORMAL LOW (ref >59)

## 2023-08-13 LAB — LIPID PANEL
Chol/HDL Ratio: 2.5 {ratio} (ref 0.0–4.4)
Cholesterol, Total: 165 mg/dL (ref 100–199)
HDL: 67 mg/dL (ref >39)
LDL Chol Calc (NIH): 84 mg/dL (ref 0–99)
Triglycerides: 72 mg/dL (ref 0–149)
VLDL Cholesterol Cal: 14 mg/dL (ref 5–40)

## 2023-08-13 LAB — HEMOGLOBIN A1C
Est. average glucose Bld gHb Est-mCnc: 126 mg/dL
Hgb A1c MFr Bld: 6 % — ABNORMAL HIGH (ref 4.8–5.6)

## 2023-08-13 LAB — TSH: TSH: 1.74 u[IU]/mL (ref 0.450–4.500)

## 2023-08-16 ENCOUNTER — Encounter: Payer: Self-pay | Admitting: Cardiology

## 2023-08-16 ENCOUNTER — Ambulatory Visit (INDEPENDENT_AMBULATORY_CARE_PROVIDER_SITE_OTHER): Payer: Medicare HMO | Admitting: Cardiology

## 2023-08-16 DIAGNOSIS — R7303 Prediabetes: Secondary | ICD-10-CM | POA: Diagnosis not present

## 2023-08-16 DIAGNOSIS — Z6841 Body Mass Index (BMI) 40.0 and over, adult: Secondary | ICD-10-CM | POA: Diagnosis not present

## 2023-08-16 MED ORDER — WEGOVY 0.5 MG/0.5ML ~~LOC~~ SOAJ: 0.5000 mg | SUBCUTANEOUS | 4 refills | Status: DC

## 2023-08-16 NOTE — Progress Notes (Signed)
Established Patient Office Visit  Subjective:  Patient ID: Lindsay Cooke, female    DOB: [birth year]  Age: 62 y.o. MRN: [id number]  Chief Complaint  Patient presents with   Follow-up    6 week follow up    Patient in office for 6 week follow up. Patient doing well, no new complaints. Patient taking and tolerating Wegovy. Will increase Wegovy to 0.5 mg weekly.  Patient has urinary incontinence and uses incontinence supplies.     No other concerns at this time.   Past Medical History:  Diagnosis Date   Allergy    Anemia    Asthma    Benign essential hypertension    GERD (gastroesophageal reflux disease)    Hyperlipidemia    Lumbar spondylosis    Obesity    Reflux    Right leg DVT (HCC)    Stroke [facility])     Past Surgical History:  Procedure Laterality Date   ABDOMINAL HYSTERECTOMY     CHOLECYSTECTOMY     COLONOSCOPY WITH PROPOFOL N/A 06/16/2017   Procedure: COLONOSCOPY WITH PROPOFOL;  Surgeon: Toney Reil, MD;  Location: [facility] ENDOSCOPY;  Service: Gastroenterology;  Laterality: N/A;   COLONOSCOPY WITH PROPOFOL N/A 11/10/2020   Procedure: COLONOSCOPY WITH PROPOFOL;  Surgeon: Toney Reil, MD;  Location: [facility] ENDOSCOPY;  Service: Gastroenterology;  Laterality: N/A;   HEEL SPUR EXCISION     HERNIA REPAIR     KELOID EXCISION      Social History   Socioeconomic History   Marital status: Single    Spouse name: Not on file   Number of children: Not on file   Years of education: Not on file   Highest education level: Not on file  Occupational History   Not on file  Tobacco Use   Smoking status: Never   Smokeless tobacco: Never  Vaping Use   Vaping status: Never Used  Substance and Sexual Activity   Alcohol use: No    Alcohol/week: 0.0 standard drinks of alcohol   Drug use: No   Sexual activity: Never  Other Topics Concern   Not on file  Social History Narrative   Not on file   Social Determinants of Health   Financial Resource Strain: Low  Risk  (08/10/2023)   Received from [facility]   Overall Financial Resource Strain (CARDIA)    Difficulty of Paying Living Expenses: Not very hard  Food Insecurity: Food Insecurity Present (08/10/2023)   Received from [facility]   Hunger Vital Sign    Worried About Running Out of Food in the Last Year: Sometimes true    Ran Out of Food in the Last Year: Sometimes true  Transportation Needs: No Transportation Needs (08/10/2023)   Received from [facility] - Transportation    In the past 12 months, has lack of transportation kept you from medical appointments or from getting medications?: No    Lack of Transportation (Non-Medical): No  Physical Activity: Not on file  Stress: Not on file  Social Connections: Not on file  Intimate Partner Violence: Not on file    Family History  Problem Relation Age of Onset   Breast cancer Sister    Heart disease Mother    Congestive Heart Failure Sister     Allergies  Allergen Reactions   Penicillins Hives and Rash    Has patient had a PCN reaction causing immediate rash, facial/tongue/throat swelling, SOB or lightheadedness with hypotension: Yes  Has patient had a PCN reaction causing severe rash involving mucus membranes or skin necrosis: No Has patient had a PCN reaction that required hospitalization No Has patient had a PCN reaction occurring within the last 10 years: No If all of the above answers are "NO", then may proceed with Cephalosporin use.   Metronidazole Rash    Other reaction(s): Skin Rashes, Hives    Outpatient Medications Prior to Visit  Medication Sig   albuterol (VENTOLIN HFA) 108 (90 Base) MCG/ACT inhaler INHALE 1 TO 2 PUFFS BY MOUTH EVERY 6 HOURS AS NEEDED FOR SHORTNESS OF BREATH OR WHEEZING (RESCUE INHALER)   aspirin 81 MG chewable tablet Chew 81 mg by mouth daily.   atorvastatin (LIPITOR) 20 MG tablet Take 1 tablet (20 mg total) by mouth every evening.    cetirizine (ZYRTEC) 10 MG tablet Take 1 tablet (10 mg total) by mouth daily.   clopidogrel (PLAVIX) 75 MG tablet Take 1 tablet (75 mg total) by mouth daily.   diclofenac Sodium (VOLTAREN) 1 % GEL APPLY 2 GRAMS TO BILATERAL KNEES EVERY 8HOURS AS NEEDED FOR ARTHRITIS   escitalopram (LEXAPRO) 10 MG tablet Take 1 tablet (10 mg total) by mouth daily.   gabapentin (NEURONTIN) 300 MG capsule Take 900 mg by mouth 2 (two) times daily.   hydrOXYzine (ATARAX/VISTARIL) 50 MG tablet Take 50 mg by mouth at bedtime.   LATUDA 60 MG TABS Take 1 tablet by mouth at bedtime.   losartan (COZAAR) 100 MG tablet Take 1 tablet (100 mg total) by mouth daily.   montelukast (SINGULAIR) 10 MG tablet Take 1 tablet (10 mg total) by mouth daily.   naproxen (NAPROSYN) 500 MG tablet Take 1 tablet (500 mg total) by mouth 2 (two) times daily with a meal.   pantoprazole (PROTONIX) 40 MG tablet Take 1 tablet (40 mg total) by mouth daily.   REXULTI 2 MG TABS tablet Take 2 mg by mouth daily.   spironolactone (ALDACTONE) 25 MG tablet TAKE 1 TABLET BY MOUTH DAILY   zolpidem (AMBIEN CR) 6.25 MG CR tablet TAKE 1 TABLET BY MOUTH AT BEDTIME AS NEEDED FOR INSOMNIA   [DISCONTINUED] Semaglutide-Weight Management (WEGOVY) 0.25 MG/0.5ML SOAJ Inject 0.25 mg into the skin once a week. E66.01   [DISCONTINUED] benzonatate (TESSALON PERLES) 100 MG capsule Take 1 capsule (100 mg total) by mouth 3 (three) times daily as needed for cough. (Patient not taking: Reported on 08/16/2023)   [DISCONTINUED] fluticasone (FLONASE) 50 MCG/ACT nasal spray Place 1 spray into both nostrils daily. (Patient not taking: Reported on 08/16/2023)   [DISCONTINUED] guaiFENesin (MUCINEX) 600 MG 12 hr tablet Take 1 tablet (600 mg total) by mouth 2 (two) times daily. (Patient not taking: Reported on 08/16/2023)   No facility-administered medications prior to visit.    Review of Systems  Constitutional: Negative.   HENT: Negative.    Eyes: Negative.   Respiratory: Negative.   Negative for shortness of breath.   Cardiovascular: Negative.  Negative for chest pain.  Gastrointestinal: Negative.  Negative for abdominal pain, constipation and diarrhea.  Genitourinary: Negative.   Musculoskeletal:  Negative for joint pain and myalgias.  Skin: Negative.   Neurological: Negative.  Negative for dizziness and headaches.  Endo/Heme/Allergies: Negative.   All other systems reviewed and are negative.      Objective:   BP 108/76   Ht 5\' 1"  (1.549 m)   Wt (!) 316 lb 9.6 oz (143.6 kg)   BMI 59.82 kg/m   Vitals:   08/16/23 0911  BP:  108/76  Height: 5\' 1"  (1.549 m)  Weight: (!) 316 lb 9.6 oz (143.6 kg)  BMI (Calculated): 59.85    Physical Exam Vitals and nursing note reviewed.  Constitutional:      Appearance: Normal appearance. She is normal weight.  HENT:     Head: Normocephalic and atraumatic.     Nose: Nose normal.     Mouth/Throat:     Mouth: Mucous membranes are moist.  Eyes:     Extraocular Movements: Extraocular movements intact.     Conjunctiva/sclera: Conjunctivae normal.     Pupils: Pupils are equal, round, and reactive to light.  Cardiovascular:     Rate and Rhythm: Normal rate and regular rhythm.     Pulses: Normal pulses.     Heart sounds: Normal heart sounds.  Pulmonary:     Effort: Pulmonary effort is normal.     Breath sounds: Normal breath sounds.  Abdominal:     General: Abdomen is flat. Bowel sounds are normal.     Palpations: Abdomen is soft.  Musculoskeletal:        General: Normal range of motion.     Cervical back: Normal range of motion.  Skin:    General: Skin is warm and dry.  Neurological:     General: No focal deficit present.     Mental Status: She is alert and oriented to person, place, and time.  Psychiatric:        Mood and Affect: Mood normal.        Behavior: Behavior normal.        Thought Content: Thought content normal.        Judgment: Judgment normal.      No results found for any visits on  08/16/23.  Recent Results (from the past 2160 hour(s))  Lipid panel     Status: None   Collection Time: 08/12/23 10:57 AM  Result Value Ref Range   Cholesterol, Total 165 100 - 199 mg/dL   Triglycerides 72 0 - 149 mg/dL   HDL 67 >16 mg/dL   VLDL Cholesterol Cal 14 5 - 40 mg/dL   LDL Chol Calc (NIH) 84 0 - 99 mg/dL   Chol/HDL Ratio 2.5 0.0 - 4.4 ratio    Comment:                                   T. Chol/HDL Ratio                                             Men  Women                               1/2 Avg.Risk  3.4    3.3                                   Avg.Risk  5.0    4.4                                2X Avg.Risk  9.6    7.1  3X Avg.Risk 23.4   11.0   CMP14+EGFR     Status: Abnormal   Collection Time: 08/12/23 10:57 AM  Result Value Ref Range   Glucose 101 (H) 70 - 99 mg/dL   BUN 13 8 - 27 mg/dL   Creatinine, Ser 1.61 (H) 0.57 - 1.00 mg/dL   eGFR 49 (L) >09 UE/AVW/0.98   BUN/Creatinine Ratio 10 (L) 12 - 28   Sodium 143 134 - 144 mmol/L   Potassium 4.4 3.5 - 5.2 mmol/L   Chloride 104 96 - 106 mmol/L   CO2 25 20 - 29 mmol/L   Calcium 9.5 8.7 - 10.3 mg/dL   Total Protein 6.9 6.0 - 8.5 g/dL   Albumin 4.4 3.8 - 4.9 g/dL   Globulin, Total 2.5 1.5 - 4.5 g/dL   Bilirubin Total 0.5 0.0 - 1.2 mg/dL   Alkaline Phosphatase 64 44 - 121 IU/L   AST 12 0 - 40 IU/L   ALT 21 0 - 32 IU/L  TSH     Status: None   Collection Time: 08/12/23 10:57 AM  Result Value Ref Range   TSH 1.740 0.450 - 4.500 uIU/mL  Hemoglobin A1c     Status: Abnormal   Collection Time: 08/12/23 10:57 AM  Result Value Ref Range   Hgb A1c MFr Bld 6.0 (H) 4.8 - 5.6 %    Comment:          Prediabetes: 5.7 - 6.4          Diabetes: >6.4          Glycemic control for adults with diabetes: <7.0    Est. average glucose Bld gHb Est-mCnc 126 mg/dL      Assessment & Plan:  Increase Wegovy to 0.5 mg.  Incontinence supplies.   Problem List Items Addressed This Visit       Other    Morbid obesity (HCC) - Primary   Relevant Medications   Semaglutide-Weight Management (WEGOVY) 0.5 MG/0.5ML SOAJ   Pre-diabetes   BMI 50.0-59.9, adult (HCC)   Relevant Medications   Semaglutide-Weight Management (WEGOVY) 0.5 MG/0.5ML SOAJ    Return in about 2 months (around 10/17/2023).   Total time spent: 25 minutes  [employer], NP  08/16/2023   This document may have been prepared by Dragon Voice Recognition software and as such may include unintentional dictation errors.

## 2023-08-18 ENCOUNTER — Other Ambulatory Visit: Payer: Self-pay | Admitting: Family

## 2023-08-18 ENCOUNTER — Other Ambulatory Visit: Payer: Self-pay | Admitting: Cardiovascular Disease

## 2023-08-24 ENCOUNTER — Encounter: Payer: Self-pay | Admitting: Podiatry

## 2023-08-24 ENCOUNTER — Telehealth: Payer: Self-pay | Admitting: Cardiology

## 2023-08-24 NOTE — Telephone Encounter (Signed)
Patient left VM wanting to know if we've sent the papers to [employer] that we needed to send. We are awaiting the doctors signature so we can send off the papers they faxed [place] today.

## 2023-09-01 DIAGNOSIS — R32 Unspecified urinary incontinence: Secondary | ICD-10-CM | POA: Diagnosis not present

## 2023-09-02 ENCOUNTER — Ambulatory Visit
Admission: RE | Admit: 2023-09-02 | Discharge: 2023-09-02 | Disposition: A | Discharge status: Home / Self Care | Payer: Medicare HMO | Source: Ambulatory Visit | Attending: Podiatry

## 2023-09-02 DIAGNOSIS — M7732 Calcaneal spur, left foot: Secondary | ICD-10-CM

## 2023-09-02 DIAGNOSIS — M67874 Other specified disorders of tendon, left ankle and foot: Secondary | ICD-10-CM | POA: Diagnosis not present

## 2023-09-02 DIAGNOSIS — M216X1 Other acquired deformities of right foot: Secondary | ICD-10-CM

## 2023-09-02 DIAGNOSIS — M7662 Achilles tendinitis, left leg: Secondary | ICD-10-CM

## 2023-09-02 DIAGNOSIS — M79672 Pain in left foot: Secondary | ICD-10-CM

## 2023-09-02 DIAGNOSIS — M19072 Primary osteoarthritis, left ankle and foot: Secondary | ICD-10-CM | POA: Diagnosis not present

## 2023-09-16 ENCOUNTER — Ambulatory Visit (INDEPENDENT_AMBULATORY_CARE_PROVIDER_SITE_OTHER): Payer: 59 | Admitting: Cardiovascular Disease

## 2023-09-16 ENCOUNTER — Encounter: Payer: Self-pay | Admitting: Cardiovascular Disease

## 2023-09-16 ENCOUNTER — Other Ambulatory Visit: Payer: Self-pay | Admitting: Internal Medicine

## 2023-09-16 VITALS — BP 136/81 | HR 82 | Ht 61.0 in | Wt 320.4 lb

## 2023-09-16 DIAGNOSIS — G479 Sleep disorder, unspecified: Secondary | ICD-10-CM

## 2023-09-16 DIAGNOSIS — E782 Mixed hyperlipidemia: Secondary | ICD-10-CM | POA: Diagnosis not present

## 2023-09-16 DIAGNOSIS — R0602 Shortness of breath: Secondary | ICD-10-CM | POA: Diagnosis not present

## 2023-09-16 DIAGNOSIS — I10 Essential (primary) hypertension: Secondary | ICD-10-CM | POA: Diagnosis not present

## 2023-09-16 NOTE — Progress Notes (Signed)
 Cardiology Office Note   Date:  09/16/2023   ID:  Lindsay Cooke, Lindsay Cooke Jul 19, 1961, MRN [id number]  PCP:  Carin Gauze, NP  Cardiologist:  Denyse Bathe, MD      History of Present Illness: Lindsay Cooke is a 63 y.o. female who presents for No chief complaint on file.   Gets DOE  Shortness of Breath This is a recurrent problem. The current episode started 1 to 4 weeks ago. The problem has been unchanged. Pertinent negatives include no chest pain. The symptoms are aggravated by any activity.      Past Medical History:  Diagnosis Date   Allergy    Anemia    Asthma    Benign essential hypertension    GERD (gastroesophageal reflux disease)    Hyperlipidemia    Lumbar spondylosis    Obesity    Reflux    Right leg DVT (HCC)    Stroke [facility])      Past Surgical History:  Procedure Laterality Date   ABDOMINAL HYSTERECTOMY     CHOLECYSTECTOMY     COLONOSCOPY WITH PROPOFOL  N/A 06/16/2017   Procedure: COLONOSCOPY WITH PROPOFOL ;  Surgeon: Unk Corinn Skiff, MD;  Location: [facility] ENDOSCOPY;  Service: Gastroenterology;  Laterality: N/A;   COLONOSCOPY WITH PROPOFOL  N/A 11/10/2020   Procedure: COLONOSCOPY WITH PROPOFOL ;  Surgeon: Unk Corinn Skiff, MD;  Location: [facility] ENDOSCOPY;  Service: Gastroenterology;  Laterality: N/A;   HEEL SPUR EXCISION     HERNIA REPAIR     KELOID EXCISION       Current Outpatient Medications  Medication Sig Dispense Refill   albuterol  (VENTOLIN  HFA) 108 (90 Base) MCG/ACT inhaler INHALE 1 TO 2 PUFFS BY MOUTH EVERY 6 HOURS AS NEEDED FOR SHORTNESS OF BREATH OR WHEEZING (RESCUE INHALER) 8.5 g 1   aspirin  81 MG chewable tablet Chew 81 mg by mouth daily.     atorvastatin  (LIPITOR) 20 MG tablet Take 1 tablet (20 mg total) by mouth every evening. 90 tablet 3   cetirizine  (ZYRTEC ) 10 MG tablet Take 1 tablet (10 mg total) by mouth daily. 90 tablet 3   clopidogrel  (PLAVIX ) 75 MG tablet Take 1 tablet (75 mg total) by mouth daily. 90 tablet 3   diclofenac  Sodium (VOLTAREN) 1 % GEL APPLY 2 GRAMS TO BILATERAL KNEES EVERY 8HOURS AS NEEDED FOR ARTHRITIS 100 g 1   escitalopram  (LEXAPRO ) 10 MG tablet Take 1 tablet (10 mg total) by mouth daily. 90 tablet 3   gabapentin (NEURONTIN) 300 MG capsule Take 900 mg by mouth 2 (two) times daily.     hydrOXYzine  (ATARAX /VISTARIL ) 50 MG tablet Take 50 mg by mouth at bedtime.     LATUDA 60 MG TABS Take 1 tablet by mouth at bedtime.     losartan  (COZAAR ) 100 MG tablet Take 1 tablet (100 mg total) by mouth daily. 90 tablet 3   montelukast  (SINGULAIR ) 10 MG tablet TAKE 1 TABLET BY MOUTH DAILY 90 tablet 1   naproxen  (NAPROSYN ) 500 MG tablet Take 1 tablet (500 mg total) by mouth 2 (two) times daily with a meal. 20 tablet 2   pantoprazole  (PROTONIX ) 40 MG tablet Take 1 tablet (40 mg total) by mouth daily. 90 tablet 1   potassium chloride  SA (KLOR-CON  M) 20 MEQ tablet TAKE 1 TABLET BY MOUTH DAILY 90 tablet 1   REXULTI 2 MG TABS tablet Take 2 mg by mouth daily.     Semaglutide -Weight Management (WEGOVY ) 0.5 MG/0.5ML SOAJ Inject 0.5 mg into the skin once  a week. 2 mL 4   spironolactone (ALDACTONE) 25 MG tablet TAKE 1 TABLET BY MOUTH DAILY 90 tablet 0   zolpidem  (AMBIEN  CR) 6.25 MG CR tablet TAKE 1 TABLET BY MOUTH AT BEDTIME AS NEEDED FOR INSOMNIA 30 tablet 2   No current facility-administered medications for this visit.    Allergies:   Penicillins and Metronidazole    Social History:   reports that she has never smoked. She has never used smokeless tobacco. She reports that she does not drink alcohol and does not use drugs.   Family History:  family history includes Breast cancer in her sister; Congestive Heart Failure in her sister; Heart disease in her mother.    ROS:     Review of Systems  Constitutional: Negative.   HENT: Negative.    Eyes: Negative.   Respiratory:  Positive for shortness of breath.   Cardiovascular:  Negative for chest pain.  Gastrointestinal: Negative.   Genitourinary: Negative.    Musculoskeletal: Negative.   Skin: Negative.   Neurological: Negative.   Endo/Heme/Allergies: Negative.   Psychiatric/Behavioral: Negative.    All other systems reviewed and are negative.     All other systems are reviewed and negative.    PHYSICAL EXAM: VS:  BP 136/81   Pulse 82   Ht 5' 1 (1.549 m)   Wt (!) 320 lb 6.4 oz (145.3 kg)   SpO2 97%   BMI 60.54 kg/m  , BMI Body mass index is 60.54 kg/m. Last weight:  Wt Readings from Last 3 Encounters:  09/16/23 (!) 320 lb 6.4 oz (145.3 kg)  08/16/23 (!) 316 lb 9.6 oz (143.6 kg)  07/04/23 (!) 321 lb 6.4 oz (145.8 kg)     Physical Exam Constitutional:      Appearance: Normal appearance.  Cardiovascular:     Rate and Rhythm: Normal rate and regular rhythm.     Heart sounds: Normal heart sounds.  Pulmonary:     Effort: Pulmonary effort is normal.     Breath sounds: Normal breath sounds.  Musculoskeletal:     Right lower leg: No edema.     Left lower leg: No edema.  Neurological:     Mental Status: She is alert.       EKG:   Recent Labs: 08/12/2023: ALT 21; BUN 13; Creatinine, Ser 1.26; Potassium 4.4; Sodium 143; TSH 1.740    Lipid Panel    Component Value Date/Time   CHOL 165 08/12/2023 1057   TRIG 72 08/12/2023 1057   HDL 67 08/12/2023 1057   CHOLHDL 2.5 08/12/2023 1057   CHOLHDL 2.8 11/25/2015 0502   VLDL 10 11/25/2015 0502   LDLCALC 84 08/12/2023 1057      Other studies Reviewed: Additional studies/ records that were reviewed today include:  Review of the above records demonstrates:       No data to display            ASSESSMENT AND PLAN:    ICD-10-CM   1. Primary hypertension  I10    Repeat blood pressure is actually on the low side 100/60.  Patient however does not feel any dizziness.    2. Mixed hyperlipidemia  E78.2     3. Shortness of breath  R06.02    Patient has history of HFpEF.  She is noncompliant with sleep apnea treatment and is not on CPAP.  Needs to be set up for repeat  sleep study.    4. Sleep disorder  G47.9    not wearing  cpap.  Shortness of breath may be related to lack of getting adequate sleep and apneas at night due to noncompliance with sleep apnea treatment.       Problem List Items Addressed This Visit       Cardiovascular and Mediastinum   Hypertension - Primary     Other   Hyperlipidemia   Sleep disorder   Shortness of breath       Disposition:   Return in about 3 months (around 12/15/2023).    Total time spent: 30 minutes  Signed,  Denyse Bathe, MD  09/16/2023 11:14 AM    [facility]

## 2023-09-26 ENCOUNTER — Telehealth: Payer: Self-pay

## 2023-09-26 NOTE — Telephone Encounter (Signed)
Need to call patient and let her know that medicare will not cover her weight loss injections its excluded from all medicare plans without a cardiovascular event or stroke

## 2023-09-26 NOTE — Telephone Encounter (Signed)
Patient informed. 

## 2023-10-05 DIAGNOSIS — M216X2 Other acquired deformities of left foot: Secondary | ICD-10-CM | POA: Diagnosis not present

## 2023-10-05 DIAGNOSIS — M216X1 Other acquired deformities of right foot: Secondary | ICD-10-CM | POA: Diagnosis not present

## 2023-10-05 DIAGNOSIS — M7662 Achilles tendinitis, left leg: Secondary | ICD-10-CM | POA: Diagnosis not present

## 2023-10-05 DIAGNOSIS — M7732 Calcaneal spur, left foot: Secondary | ICD-10-CM | POA: Diagnosis not present

## 2023-10-05 DIAGNOSIS — M79672 Pain in left foot: Secondary | ICD-10-CM | POA: Diagnosis not present

## 2023-10-05 DIAGNOSIS — Z86718 Personal history of other venous thrombosis and embolism: Secondary | ICD-10-CM | POA: Diagnosis not present

## 2023-10-06 ENCOUNTER — Telehealth: Payer: Self-pay | Admitting: Cardiology

## 2023-10-06 DIAGNOSIS — G4733 Obstructive sleep apnea (adult) (pediatric): Secondary | ICD-10-CM | POA: Diagnosis not present

## 2023-10-06 NOTE — Telephone Encounter (Signed)
Called to get patient's most recent A1c and GFR. Information given.

## 2023-10-14 DIAGNOSIS — I10 Essential (primary) hypertension: Secondary | ICD-10-CM | POA: Diagnosis not present

## 2023-10-14 DIAGNOSIS — E119 Type 2 diabetes mellitus without complications: Secondary | ICD-10-CM | POA: Diagnosis not present

## 2023-10-14 DIAGNOSIS — H2513 Age-related nuclear cataract, bilateral: Secondary | ICD-10-CM | POA: Diagnosis not present

## 2023-10-17 ENCOUNTER — Other Ambulatory Visit: Payer: Self-pay

## 2023-10-20 ENCOUNTER — Ambulatory Visit: Payer: 59 | Admitting: Cardiology

## 2023-10-20 MED ORDER — ZOLPIDEM TARTRATE ER 6.25 MG PO TBCR: EXTENDED_RELEASE_TABLET | ORAL | 2 refills | Status: DC

## 2023-11-03 DIAGNOSIS — H2512 Age-related nuclear cataract, left eye: Secondary | ICD-10-CM | POA: Diagnosis not present

## 2023-11-04 DIAGNOSIS — H25041 Posterior subcapsular polar age-related cataract, right eye: Secondary | ICD-10-CM | POA: Diagnosis not present

## 2023-11-04 DIAGNOSIS — H2511 Age-related nuclear cataract, right eye: Secondary | ICD-10-CM | POA: Diagnosis not present

## 2023-11-05 DIAGNOSIS — G4733 Obstructive sleep apnea (adult) (pediatric): Secondary | ICD-10-CM | POA: Diagnosis not present

## 2023-11-09 ENCOUNTER — Other Ambulatory Visit: Payer: Self-pay | Admitting: Cardiovascular Disease

## 2023-11-10 DIAGNOSIS — H2512 Age-related nuclear cataract, left eye: Secondary | ICD-10-CM | POA: Diagnosis not present

## 2023-11-15 ENCOUNTER — Other Ambulatory Visit: Payer: Self-pay | Admitting: Family

## 2023-11-21 DIAGNOSIS — M7732 Calcaneal spur, left foot: Secondary | ICD-10-CM | POA: Diagnosis not present

## 2023-11-21 DIAGNOSIS — M216X1 Other acquired deformities of right foot: Secondary | ICD-10-CM | POA: Diagnosis not present

## 2023-11-21 DIAGNOSIS — M79672 Pain in left foot: Secondary | ICD-10-CM | POA: Diagnosis not present

## 2023-11-21 DIAGNOSIS — M7662 Achilles tendinitis, left leg: Secondary | ICD-10-CM | POA: Diagnosis not present

## 2023-11-21 DIAGNOSIS — M216X2 Other acquired deformities of left foot: Secondary | ICD-10-CM | POA: Diagnosis not present

## 2023-11-21 DIAGNOSIS — Z86718 Personal history of other venous thrombosis and embolism: Secondary | ICD-10-CM | POA: Diagnosis not present

## 2023-11-22 ENCOUNTER — Encounter: Payer: Self-pay | Admitting: Cardiology

## 2023-11-22 ENCOUNTER — Other Ambulatory Visit: Payer: Self-pay | Admitting: Cardiology

## 2023-11-22 DIAGNOSIS — Z1231 Encounter for screening mammogram for malignant neoplasm of breast: Secondary | ICD-10-CM

## 2023-11-24 DIAGNOSIS — H25041 Posterior subcapsular polar age-related cataract, right eye: Secondary | ICD-10-CM | POA: Diagnosis not present

## 2023-11-24 DIAGNOSIS — H2511 Age-related nuclear cataract, right eye: Secondary | ICD-10-CM | POA: Diagnosis not present

## 2023-11-25 ENCOUNTER — Other Ambulatory Visit: Payer: Self-pay | Admitting: Podiatry

## 2023-12-02 DIAGNOSIS — H2511 Age-related nuclear cataract, right eye: Secondary | ICD-10-CM | POA: Diagnosis not present

## 2023-12-07 ENCOUNTER — Other Ambulatory Visit: Payer: Self-pay

## 2023-12-07 ENCOUNTER — Other Ambulatory Visit: Payer: Self-pay | Admitting: Cardiovascular Disease

## 2023-12-07 ENCOUNTER — Encounter
Admission: RE | Admit: 2023-12-07 | Discharge: 2023-12-07 | Disposition: A | Discharge status: Home / Self Care | Source: Ambulatory Visit | Attending: Podiatry | Admitting: Podiatry

## 2023-12-07 DIAGNOSIS — I10 Essential (primary) hypertension: Secondary | ICD-10-CM

## 2023-12-07 DIAGNOSIS — D649 Anemia, unspecified: Secondary | ICD-10-CM

## 2023-12-07 HISTORY — DX: Prediabetes: R73.03

## 2023-12-07 HISTORY — DX: Other acquired deformities of right foot: M21.6X1

## 2023-12-07 HISTORY — DX: Pneumonia, unspecified organism: J18.9

## 2023-12-07 HISTORY — DX: Cardiac arrhythmia, unspecified: I49.9

## 2023-12-07 HISTORY — DX: Dyspnea, unspecified: R06.00

## 2023-12-07 HISTORY — DX: Body Mass Index (BMI) 40.0 and over, adult: Z684

## 2023-12-07 HISTORY — DX: Pain in left foot: M79.672

## 2023-12-07 HISTORY — DX: Achilles tendinitis, left leg: M76.62

## 2023-12-07 HISTORY — DX: Anxiety disorder, unspecified: F41.9

## 2023-12-07 HISTORY — DX: Obesity, class 3: E66.813

## 2023-12-07 HISTORY — DX: Depression, unspecified: F32.A

## 2023-12-07 HISTORY — DX: Body mass index (BMI) 60.0-69.9, adult: E66.01

## 2023-12-07 HISTORY — DX: Unspecified osteoarthritis, unspecified site: M19.90

## 2023-12-07 HISTORY — DX: Sleep apnea, unspecified: G47.30

## 2023-12-07 HISTORY — DX: Calcaneal spur, left foot: M77.32

## 2023-12-07 NOTE — Patient Instructions (Addendum)
 Your procedure is scheduled on: 12/15/23 - Thursday Report to the Registration Desk on the 1st floor of the [facility]. To find out your arrival time, please call [phone] between 1PM - 3PM on: 12/14/23 - Wednesday If your arrival time is 6:00 am, do not arrive before that time as the [facility] entrance doors do not open until 6:00 am.  REMEMBER: Instructions that are not followed completely may result in serious medical risk, up to and including death; or upon the discretion of your surgeon and anesthesiologist your surgery may need to be rescheduled.  Do not eat food after midnight the night before surgery.  No gum chewing or hard candies.  You may however, drink CLEAR liquids up to 2 hours before you are scheduled to arrive for your surgery. Do not drink anything within 2 hours of your scheduled arrival time.  Clear liquids include: - water  - apple juice without pulp - gatorade (not RED colors) - black coffee or tea (Do NOT add milk or creamers to the coffee or tea) Do NOT drink anything that is not on this list.  In addition, your doctor has ordered for you to drink the provided:  Ensure Pre-Surgery Clear Carbohydrate Drink  Drinking this carbohydrate drink up to two hours before surgery helps to reduce insulin resistance and improve patient outcomes. Please complete drinking 2 hours before scheduled arrival time.  One week prior to surgery: Stop taking beginning 12/08/23,  Anti-inflammatories (NSAIDS) such as VOLTAREN ,Advil, Aleve, Ibuprofen, Motrin, Naproxen, Naprosyn and Aspirin based products such as Excedrin, Goody's Powder, BC Powder.   Stop ANY OVER THE COUNTER supplements until after surgery.  HOLD losartan (COZAAR) ,spironolactone (ALDACTONE) , Aspirin on the morning or surgery.   clopidogrel (PLAVIX) - hold beginning 12/10/23.   If you normally take these medications in the mornings,  TAKE THESE MEDICATIONS WITH SIPS OF WATER ON THE DAY OF  SURGERY:  escitalopram (LEXAPRO)  gabapentin (NEURONTIN)  montelukast (SINGULAIR)  pantoprazole (PROTONIX)  potassium chloride SA  REXULTI   Use inhalers on the day of surgery and bring to the hospital : albuterol (VENTOLIN HFA)   No Alcohol for 24 hours before or after surgery.  No Smoking including e-cigarettes for 24 hours before surgery.  No chewable tobacco products for at least 6 hours before surgery.  No nicotine patches on the day of surgery.  Do not use any "recreational" drugs for at least a week (preferably 2 weeks) before your surgery.  Please be advised that the combination of cocaine and anesthesia may have negative outcomes, up to and including death. If you test positive for cocaine, your surgery will be cancelled.  On the morning of surgery brush your teeth with toothpaste and water, you may rinse your mouth with mouthwash if you wish. Do not swallow any toothpaste or mouthwash.  Use CHG Soap or wipes as directed on instruction sheet.  Do not wear jewelry, make-up, hairpins, clips or nail polish.  For welded (permanent) jewelry: bracelets, anklets, waist bands, etc.  Please have this removed prior to surgery.  If it is not removed, there is a chance that hospital personnel will need to cut it off on the day of surgery.  Do not wear lotions, powders, or perfumes.   Do not shave body hair from the neck down 48 hours before surgery.  Contact lenses, hearing aids and dentures may not be worn into surgery.  Do not bring valuables to the hospital. [facility] is not responsible  for any missing/lost belongings or valuables.   Notify your doctor if there is any change in your medical condition (cold, fever, infection).  Wear comfortable clothing (specific to your surgery type) to the hospital.  After surgery, you can help prevent lung complications by doing breathing exercises.  Take deep breaths and cough every 1-2 hours. Your doctor may order a device called an  Incentive Spirometer to help you take deep breaths. When coughing or sneezing, hold a pillow firmly against your incision with both hands. This is called "splinting." Doing this helps protect your incision. It also decreases belly discomfort.  If you are being admitted to the hospital overnight, leave your suitcase in the car. After surgery it may be brought to your room.  In case of increased patient census, it may be necessary for you, the patient, to continue your postoperative care in the Same Day Surgery department.  If you are being discharged the day of surgery, you will not be allowed to drive home. You will need a responsible individual to drive you home and stay with you for 24 hours after surgery.   If you are taking public transportation, you will need to have a responsible individual with you.  Please call the Pre-admissions Testing Dept. at [phone] if you have any questions about these instructions.  Surgery Visitation Policy:  Patients having surgery or a procedure may have two visitors.  Children under the age of 35 must have an adult with them who is not the patient.  Inpatient Visitation:    Visiting hours are 7 a.m. to 8 p.m. Up to four visitors are allowed at one time in a patient room. The visitors may rotate out with other people during the day.  One visitor age 55 or older may stay with the patient overnight and must be in the room by 8 p.m.     Preparing for Surgery with CHLORHEXIDINE GLUCONATE (CHG) Soap  Chlorhexidine Gluconate (CHG) Soap  o An antiseptic cleaner that kills germs and bonds with the skin to continue killing germs even after washing  o Used for showering the night before surgery and morning of surgery  Before surgery, you can play an important role by reducing the number of germs on your skin.  CHG (Chlorhexidine gluconate) soap is an antiseptic cleanser which kills germs and bonds with the skin to continue killing germs even after  washing.  Please do not use if you have an allergy to CHG or antibacterial soaps. If your skin becomes reddened/irritated stop using the CHG.  1. Shower the NIGHT BEFORE SURGERY and the MORNING OF SURGERY with CHG soap.  2. If you choose to wash your hair, wash your hair first as usual with your normal shampoo.  3. After shampooing, rinse your hair and body thoroughly to remove the shampoo.  4. Use CHG as you would any other liquid soap. You can apply CHG directly to the skin and wash gently with a scrungie or a clean washcloth.  5. Apply the CHG soap to your body only from the neck down. Do not use on open wounds or open sores. Avoid contact with your eyes, ears, mouth, and genitals (private parts). Wash face and genitals (private parts) with your normal soap.  6. Wash thoroughly, paying special attention to the area where your surgery will be performed.  7. Thoroughly rinse your body with warm water.  8. Do not shower/wash with your normal soap after using and rinsing off the CHG soap.  9. Pat yourself dry with a clean towel.  10. Wear clean pajamas to bed the night before surgery.  12. Place clean sheets on your bed the night of your first shower and do not sleep with pets.  13. Shower again with the CHG soap on the day of surgery prior to arriving at the hospital.  14. Do not apply any deodorants/lotions/powders.  15. Please wear clean clothes to the hospital.

## 2023-12-12 ENCOUNTER — Encounter
Admission: RE | Admit: 2023-12-12 | Discharge: 2023-12-12 | Disposition: A | Discharge status: Home / Self Care | Source: Ambulatory Visit | Attending: Podiatry | Admitting: Podiatry

## 2023-12-12 DIAGNOSIS — Z01810 Encounter for preprocedural cardiovascular examination: Secondary | ICD-10-CM | POA: Diagnosis not present

## 2023-12-12 DIAGNOSIS — D649 Anemia, unspecified: Secondary | ICD-10-CM | POA: Insufficient documentation

## 2023-12-12 DIAGNOSIS — I10 Essential (primary) hypertension: Secondary | ICD-10-CM | POA: Diagnosis not present

## 2023-12-12 DIAGNOSIS — Z01818 Encounter for other preprocedural examination: Secondary | ICD-10-CM | POA: Diagnosis not present

## 2023-12-12 LAB — BASIC METABOLIC PANEL WITH GFR
Anion gap: 8 (ref 5–15)
BUN: 10 mg/dL (ref 6–20)
CO2: 28 mmol/L (ref 22–32)
Calcium: 9.4 mg/dL (ref 8.9–10.3)
Chloride: 105 mmol/L (ref 98–111)
Creatinine, Ser: 1.17 mg/dL — ABNORMAL HIGH (ref 0.44–1.00)
GFR, Estimated: 53 mL/min — ABNORMAL LOW (ref >60)
Glucose, Bld: 93 mg/dL (ref 70–99)
Potassium: 4.3 mmol/L (ref 3.5–5.1)
Sodium: 141 mmol/L (ref 135–145)

## 2023-12-12 LAB — CBC
HCT: 38.1 % (ref 36.0–46.0)
Hemoglobin: 12.9 g/dL (ref 12.0–15.0)
MCH: 28.8 pg (ref 26.0–34.0)
MCHC: 33.9 g/dL (ref 30.0–36.0)
MCV: 85 fL (ref 80.0–100.0)
Platelets: 224 10*3/uL (ref 150–400)
RBC: 4.48 MIL/uL (ref 3.87–5.11)
RDW: 15.2 % (ref 11.5–15.5)
WBC: 4.2 10*3/uL (ref 4.0–10.5)
nRBC: 0 % (ref 0.0–0.2)

## 2023-12-13 ENCOUNTER — Ambulatory Visit (INDEPENDENT_AMBULATORY_CARE_PROVIDER_SITE_OTHER)

## 2023-12-13 ENCOUNTER — Ambulatory Visit (INDEPENDENT_AMBULATORY_CARE_PROVIDER_SITE_OTHER): Admitting: Cardiovascular Disease

## 2023-12-13 ENCOUNTER — Encounter: Payer: Self-pay | Admitting: Cardiovascular Disease

## 2023-12-13 VITALS — BP 122/81 | HR 80 | Ht 61.0 in | Wt 317.0 lb

## 2023-12-13 DIAGNOSIS — M25511 Pain in right shoulder: Secondary | ICD-10-CM

## 2023-12-13 DIAGNOSIS — I10 Essential (primary) hypertension: Secondary | ICD-10-CM

## 2023-12-13 DIAGNOSIS — R0602 Shortness of breath: Secondary | ICD-10-CM

## 2023-12-13 DIAGNOSIS — G479 Sleep disorder, unspecified: Secondary | ICD-10-CM

## 2023-12-13 DIAGNOSIS — E782 Mixed hyperlipidemia: Secondary | ICD-10-CM

## 2023-12-13 NOTE — Progress Notes (Signed)
 Cardiology Office Note   Date:  12/13/2023   ID:  Lindsay Cooke, DOB 1961-06-26, MRN [id number]  PCP:  Lindsay Ivan, NP  Cardiologist:  Adrian Blackwater, MD      History of Present Illness: Lindsay Cooke is a 63 y.o. female who presents for  Chief Complaint  Patient presents with   Follow-up    3 month follow up     Has no chest pain or SOB. She fell 2 weeks ago, and hurts in right shoulder.      Past Medical History:  Diagnosis Date   Allergy    Anemia    Anxiety    Arthritis    Asthma    Benign essential hypertension    Calcaneal spur, left    Class 3 severe obesity with serious comorbidity and body mass index (BMI) of 60.0 to 69.9 in adult, unspecified obesity type (HCC)    Depression    Dyspnea    Dysrhythmia    Equinus deformity of both feet    GERD (gastroesophageal reflux disease)    Hyperlipidemia    Left foot pain    Lumbar spondylosis    Obesity    Pneumonia    Pre-diabetes    Reflux    Right leg DVT (HCC)    Sleep apnea    no cpap   Stroke (HCC)    Tendonitis, Achilles, left      Past Surgical History:  Procedure Laterality Date   ABDOMINAL HYSTERECTOMY     CHOLECYSTECTOMY     COLONOSCOPY WITH PROPOFOL N/A 06/16/2017   Procedure: COLONOSCOPY WITH PROPOFOL;  Surgeon: Toney Reil, MD;  Location: [facility] ENDOSCOPY;  Service: Gastroenterology;  Laterality: N/A;   COLONOSCOPY WITH PROPOFOL N/A 11/10/2020   Procedure: COLONOSCOPY WITH PROPOFOL;  Surgeon: Toney Reil, MD;  Location: [facility] ENDOSCOPY;  Service: Gastroenterology;  Laterality: N/A;   HEEL SPUR EXCISION     HERNIA REPAIR     KELOID EXCISION       Current Outpatient Medications  Medication Sig Dispense Refill   acetaminophen (TYLENOL) 500 MG tablet Take 1,000 mg by mouth every 6 (six) hours as needed.     albuterol (VENTOLIN HFA) 108 (90 Base) MCG/ACT inhaler INHALE 1 TO 2 PUFFS BY MOUTH EVERY 6 HOURS AS NEEDED FOR SHORTNESS OF BREATH OR WHEEZING (RESCUE INHALER) 8.5  g 1   aspirin 81 MG chewable tablet Chew 81 mg by mouth daily.     atorvastatin (LIPITOR) 20 MG tablet TAKE 1 TABLET BY MOUTH EVERY EVENING 90 tablet 3   atorvastatin (LIPITOR) 20 MG tablet Take 20 mg by mouth every evening.     cetirizine (ZYRTEC) 10 MG tablet TAKE 1 TABLET BY MOUTH NIGHTLY AT BEDTIME FOR SEASONAL ALLERGIES 30 tablet 3   clopidogrel (PLAVIX) 75 MG tablet Take 1 tablet (75 mg total) by mouth daily. 90 tablet 3   diclofenac Sodium (VOLTAREN) 1 % GEL APPLY 2 GRAMS TO BILATERAL KNEES EVERY 8HOURS AS NEEDED FOR ARTHRITIS 100 g 1   escitalopram (LEXAPRO) 10 MG tablet Take 1 tablet (10 mg total) by mouth daily. 90 tablet 3   gabapentin (NEURONTIN) 300 MG capsule Take 900 mg by mouth 2 (two) times daily.     hydrOXYzine (ATARAX/VISTARIL) 50 MG tablet Take 50 mg by mouth at bedtime.     LATUDA 60 MG TABS Take 1 tablet by mouth at bedtime.     losartan (COZAAR) 100 MG tablet Take 1 tablet (100 mg total)  by mouth daily. 90 tablet 3   montelukast (SINGULAIR) 10 MG tablet TAKE 1 TABLET BY MOUTH DAILY 90 tablet 1   naproxen (NAPROSYN) 500 MG tablet Take 1 tablet (500 mg total) by mouth 2 (two) times daily with a meal. 20 tablet 2   pantoprazole (PROTONIX) 40 MG tablet Take 1 tablet (40 mg total) by mouth daily. 90 tablet 1   potassium chloride SA (KLOR-CON M) 20 MEQ tablet TAKE 1 TABLET BY MOUTH DAILY 90 tablet 1   REXULTI 2 MG TABS tablet Take 2 mg by mouth daily.     spironolactone (ALDACTONE) 25 MG tablet TAKE 1 TABLET BY MOUTH DAILY 90 tablet 0   zolpidem (AMBIEN CR) 6.25 MG CR tablet TAKE 1 TABLET BY MOUTH AT BEDTIME AS NEEDED FOR INSOMNIA 30 tablet 2   No current facility-administered medications for this visit.    Allergies:   Penicillins and Metronidazole    Social History:   reports that she has never smoked. She has never used smokeless tobacco. She reports that she does not drink alcohol and does not use drugs.   Family History:  family history includes Breast cancer in her  sister; Congestive Heart Failure in her sister; Heart disease in her mother.    ROS:     Review of Systems  Constitutional: Negative.   HENT: Negative.    Eyes: Negative.   Respiratory: Negative.    Gastrointestinal: Negative.   Genitourinary: Negative.   Musculoskeletal: Negative.   Skin: Negative.   Neurological: Negative.   Endo/Heme/Allergies: Negative.   Psychiatric/Behavioral: Negative.    All other systems reviewed and are negative.     All other systems are reviewed and negative.    PHYSICAL EXAM: VS:  BP 122/81   Pulse 80   Ht 5\' 1"  (1.549 m)   Wt (!) 317 lb (143.8 kg)   SpO2 96%   BMI 59.90 kg/m  , BMI Body mass index is 59.9 kg/m. Last weight:  Wt Readings from Last 3 Encounters:  12/13/23 (!) 317 lb (143.8 kg)  09/16/23 (!) 320 lb 6.4 oz (145.3 kg)  08/16/23 (!) 316 lb 9.6 oz (143.6 kg)     Physical Exam Constitutional:      Appearance: Normal appearance.  Cardiovascular:     Rate and Rhythm: Normal rate and regular rhythm.     Heart sounds: Normal heart sounds.  Pulmonary:     Effort: Pulmonary effort is normal.     Breath sounds: Normal breath sounds.  Musculoskeletal:     Right lower leg: No edema.     Left lower leg: No edema.  Neurological:     Mental Status: She is alert.       EKG:   Recent Labs: 08/12/2023: ALT 21; TSH 1.740 12/12/2023: BUN 10; Creatinine, Ser 1.17; Hemoglobin 12.9; Platelets 224; Potassium 4.3; Sodium 141    Lipid Panel    Component Value Date/Time   CHOL 165 08/12/2023 1057   TRIG 72 08/12/2023 1057   HDL 67 08/12/2023 1057   CHOLHDL 2.5 08/12/2023 1057   CHOLHDL 2.8 11/25/2015 0502   VLDL 10 11/25/2015 0502   LDLCALC 84 08/12/2023 1057      Other studies Reviewed: Additional studies/ records that were reviewed today include:  Review of the above records demonstrates:       No data to display            ASSESSMENT AND PLAN:    ICD-10-CM   1. Acute pain of  right shoulder  M25.511 DG  Shoulder Right    PCV ECHOCARDIOGRAM COMPLETE   Larey Seat and has right shoulder pains, will get cxr    2. Primary hypertension  I10 DG Shoulder Right    PCV ECHOCARDIOGRAM COMPLETE    3. Mixed hyperlipidemia  E78.2 DG Shoulder Right    PCV ECHOCARDIOGRAM COMPLETE    4. Shortness of breath  R06.02 DG Shoulder Right    PCV ECHOCARDIOGRAM COMPLETE   SOB, will get echo    5. Sleep disorder  G47.9 DG Shoulder Right    PCV ECHOCARDIOGRAM COMPLETE    6. Morbid obesity (HCC)  E66.01 DG Shoulder Right    PCV ECHOCARDIOGRAM COMPLETE       Problem List Items Addressed This Visit       Cardiovascular and Mediastinum   Hypertension   Relevant Orders   DG Shoulder Right   PCV ECHOCARDIOGRAM COMPLETE     Other   Morbid obesity (HCC)   Relevant Orders   DG Shoulder Right   PCV ECHOCARDIOGRAM COMPLETE   Hyperlipidemia   Relevant Orders   DG Shoulder Right   PCV ECHOCARDIOGRAM COMPLETE   Sleep disorder   Relevant Orders   DG Shoulder Right   PCV ECHOCARDIOGRAM COMPLETE   Shortness of breath   Relevant Orders   DG Shoulder Right   PCV ECHOCARDIOGRAM COMPLETE   Other Visit Diagnoses       Acute pain of right shoulder    -  Primary   Fell and has right shoulder pains, will get cxr   Relevant Orders   DG Shoulder Right   PCV ECHOCARDIOGRAM COMPLETE          Disposition:   Return in about 4 weeks (around 01/10/2024) for XRAY today right shoulder with Dondra Spry and echo later and f/u.    Total time spent: 40 minutes  Signed,  Adrian Blackwater, MD  12/13/2023 9:43 AM    [facility]

## 2023-12-14 MED ORDER — CHLORHEXIDINE GLUCONATE 0.12 % MT SOLN
15.0000 mL | Freq: Once | OROMUCOSAL | Status: AC
  Administered 2023-12-15: 15 mL via OROMUCOSAL

## 2023-12-14 MED ORDER — CEFAZOLIN SODIUM-DEXTROSE 3-4 GM/150ML-% IV SOLN
3.0000 g | INTRAVENOUS | Status: DC
  Filled 2023-12-14: qty 150

## 2023-12-14 MED ORDER — ORAL CARE MOUTH RINSE: 15.0000 mL | Freq: Once | OROMUCOSAL | Status: AC

## 2023-12-14 MED ORDER — LACTATED RINGERS IV SOLN: INTRAVENOUS | Status: DC

## 2023-12-14 NOTE — OR Nursing (Signed)
 Attempted to reach patient for arrival time for surgery on 12/15/23. Pt did not answered phone calls.

## 2023-12-15 ENCOUNTER — Other Ambulatory Visit: Payer: Self-pay

## 2023-12-15 ENCOUNTER — Encounter: Payer: Self-pay | Admitting: Podiatry

## 2023-12-15 ENCOUNTER — Ambulatory Visit: Admitting: Certified Registered"

## 2023-12-15 ENCOUNTER — Ambulatory Visit

## 2023-12-15 ENCOUNTER — Ambulatory Visit: Payer: 59 | Admitting: Cardiovascular Disease

## 2023-12-15 ENCOUNTER — Ambulatory Visit
Admission: RE | Admit: 2023-12-15 | Discharge: 2023-12-15 | Disposition: A | Discharge status: Home / Self Care | Attending: Podiatry | Admitting: Podiatry

## 2023-12-15 ENCOUNTER — Encounter
Admission: RE | Disposition: A | Discharge status: Home / Self Care | Payer: Self-pay | Source: Home / Self Care | Attending: Podiatry

## 2023-12-15 DIAGNOSIS — I2510 Atherosclerotic heart disease of native coronary artery without angina pectoris: Secondary | ICD-10-CM | POA: Insufficient documentation

## 2023-12-15 DIAGNOSIS — G4730 Sleep apnea, unspecified: Secondary | ICD-10-CM | POA: Diagnosis not present

## 2023-12-15 DIAGNOSIS — I10 Essential (primary) hypertension: Secondary | ICD-10-CM | POA: Insufficient documentation

## 2023-12-15 DIAGNOSIS — M216X2 Other acquired deformities of left foot: Secondary | ICD-10-CM | POA: Insufficient documentation

## 2023-12-15 DIAGNOSIS — M6528 Calcific tendinitis, other site: Secondary | ICD-10-CM | POA: Diagnosis not present

## 2023-12-15 DIAGNOSIS — M25775 Osteophyte, left foot: Secondary | ICD-10-CM | POA: Diagnosis not present

## 2023-12-15 DIAGNOSIS — Z6841 Body Mass Index (BMI) 40.0 and over, adult: Secondary | ICD-10-CM | POA: Diagnosis not present

## 2023-12-15 DIAGNOSIS — J45909 Unspecified asthma, uncomplicated: Secondary | ICD-10-CM | POA: Insufficient documentation

## 2023-12-15 DIAGNOSIS — F418 Other specified anxiety disorders: Secondary | ICD-10-CM | POA: Insufficient documentation

## 2023-12-15 DIAGNOSIS — M7732 Calcaneal spur, left foot: Secondary | ICD-10-CM | POA: Diagnosis not present

## 2023-12-15 DIAGNOSIS — Z86718 Personal history of other venous thrombosis and embolism: Secondary | ICD-10-CM | POA: Diagnosis not present

## 2023-12-15 DIAGNOSIS — E6689 Other obesity not elsewhere classified: Secondary | ICD-10-CM | POA: Diagnosis not present

## 2023-12-15 DIAGNOSIS — Z79899 Other long term (current) drug therapy: Secondary | ICD-10-CM | POA: Insufficient documentation

## 2023-12-15 DIAGNOSIS — M7662 Achilles tendinitis, left leg: Secondary | ICD-10-CM | POA: Insufficient documentation

## 2023-12-15 DIAGNOSIS — G4733 Obstructive sleep apnea (adult) (pediatric): Secondary | ICD-10-CM | POA: Diagnosis not present

## 2023-12-15 DIAGNOSIS — G8918 Other acute postprocedural pain: Secondary | ICD-10-CM | POA: Diagnosis not present

## 2023-12-15 DIAGNOSIS — R7303 Prediabetes: Secondary | ICD-10-CM | POA: Insufficient documentation

## 2023-12-15 DIAGNOSIS — Z8673 Personal history of transient ischemic attack (TIA), and cerebral infarction without residual deficits: Secondary | ICD-10-CM | POA: Diagnosis not present

## 2023-12-15 DIAGNOSIS — K219 Gastro-esophageal reflux disease without esophagitis: Secondary | ICD-10-CM | POA: Diagnosis not present

## 2023-12-15 DIAGNOSIS — M659 Unspecified synovitis and tenosynovitis, unspecified site: Secondary | ICD-10-CM | POA: Diagnosis not present

## 2023-12-15 DIAGNOSIS — G8929 Other chronic pain: Secondary | ICD-10-CM | POA: Diagnosis not present

## 2023-12-15 DIAGNOSIS — M65972 Unspecified synovitis and tenosynovitis, left ankle and foot: Secondary | ICD-10-CM | POA: Diagnosis not present

## 2023-12-15 HISTORY — PX: OSTECTOMY: SHX6439

## 2023-12-15 HISTORY — PX: GASTROC RECESSION EXTREMITY: SHX6262

## 2023-12-15 HISTORY — PX: ACHILLES TENDON SURGERY: SHX542

## 2023-12-15 HISTORY — PX: TENDON TRANSFER: SHX6109

## 2023-12-15 SURGERY — RECESSION, TENDON, GASTROCNEMIUS
Anesthesia: General | Site: Foot | Laterality: Left

## 2023-12-15 MED ORDER — LIDOCAINE HCL (PF) 1 % IJ SOLN
INTRAMUSCULAR | Status: AC
  Filled 2023-12-15: qty 5

## 2023-12-15 MED ORDER — FENTANYL CITRATE PF 50 MCG/ML IJ SOSY
PREFILLED_SYRINGE | INTRAMUSCULAR | Status: AC
  Filled 2023-12-15: qty 1

## 2023-12-15 MED ORDER — IPRATROPIUM-ALBUTEROL 0.5-2.5 (3) MG/3ML IN SOLN
RESPIRATORY_TRACT | Status: AC
  Filled 2023-12-15: qty 3

## 2023-12-15 MED ORDER — ONDANSETRON HCL 4 MG/2ML IJ SOLN
INTRAMUSCULAR | Status: DC | PRN
  Administered 2023-12-15 (×2): 4 mg via INTRAVENOUS

## 2023-12-15 MED ORDER — SUCCINYLCHOLINE CHLORIDE 200 MG/10ML IV SOSY
PREFILLED_SYRINGE | INTRAVENOUS | Status: DC | PRN
Start: 2023-12-15 — End: 2023-12-15
  Administered 2023-12-15: 120 mg via INTRAVENOUS

## 2023-12-15 MED ORDER — IPRATROPIUM-ALBUTEROL 0.5-2.5 (3) MG/3ML IN SOLN
3.0000 mL | Freq: Once | RESPIRATORY_TRACT | Status: AC
  Administered 2023-12-15: 3 mL via RESPIRATORY_TRACT

## 2023-12-15 MED ORDER — MIDAZOLAM HCL 2 MG/2ML IJ SOLN
INTRAMUSCULAR | Status: AC
  Filled 2023-12-15: qty 2

## 2023-12-15 MED ORDER — BUPIVACAINE HCL (PF) 0.25 % IJ SOLN
INTRAMUSCULAR | Status: AC
  Filled 2023-12-15: qty 30

## 2023-12-15 MED ORDER — BUPIVACAINE HCL (PF) 0.5 % IJ SOLN
INTRAMUSCULAR | Status: DC | PRN
  Administered 2023-12-15: 10 mL via PERINEURAL

## 2023-12-15 MED ORDER — BUPIVACAINE LIPOSOME 1.3 % IJ SUSP
INTRAMUSCULAR | Status: AC
  Filled 2023-12-15: qty 10

## 2023-12-15 MED ORDER — FENTANYL CITRATE PF 50 MCG/ML IJ SOSY
50.0000 ug | PREFILLED_SYRINGE | Freq: Once | INTRAMUSCULAR | Status: AC
  Administered 2023-12-15: 50 ug via INTRAVENOUS

## 2023-12-15 MED ORDER — OXYCODONE-ACETAMINOPHEN 5-325 MG PO TABS: 1.0000 | ORAL_TABLET | Freq: Four times a day (QID) | ORAL | 0 refills | Status: AC | PRN

## 2023-12-15 MED ORDER — PROPOFOL 10 MG/ML IV BOLUS
INTRAVENOUS | Status: DC | PRN
  Administered 2023-12-15: 200 mg via INTRAVENOUS

## 2023-12-15 MED ORDER — 0.9 % SODIUM CHLORIDE (POUR BTL) OPTIME
TOPICAL | Status: DC | PRN
  Administered 2023-12-15: 500 mL

## 2023-12-15 MED ORDER — FENTANYL CITRATE (PF) 100 MCG/2ML IJ SOLN: 25.0000 ug | INTRAMUSCULAR | Status: DC | PRN

## 2023-12-15 MED ORDER — LIDOCAINE HCL (PF) 1 % IJ SOLN
INTRAMUSCULAR | Status: DC | PRN
  Administered 2023-12-15: 5 mL via SUBCUTANEOUS

## 2023-12-15 MED ORDER — IPRATROPIUM-ALBUTEROL 0.5-2.5 (3) MG/3ML IN SOLN
3.0000 mL | RESPIRATORY_TRACT | Status: DC
  Administered 2023-12-15: 3 mL via RESPIRATORY_TRACT

## 2023-12-15 MED ORDER — DEXAMETHASONE SODIUM PHOSPHATE 10 MG/ML IJ SOLN
INTRAMUSCULAR | Status: DC | PRN
  Administered 2023-12-15: 10 mg via INTRAVENOUS

## 2023-12-15 MED ORDER — SUGAMMADEX SODIUM 200 MG/2ML IV SOLN
INTRAVENOUS | Status: DC | PRN
  Administered 2023-12-15: 400 mg via INTRAVENOUS

## 2023-12-15 MED ORDER — ROCURONIUM BROMIDE 100 MG/10ML IV SOLN
INTRAVENOUS | Status: DC | PRN
  Administered 2023-12-15: 10 mg via INTRAVENOUS
  Administered 2023-12-15: 60 mg via INTRAVENOUS

## 2023-12-15 MED ORDER — BUPIVACAINE LIPOSOME 1.3 % IJ SUSP
INTRAMUSCULAR | Status: DC | PRN
  Administered 2023-12-15: 10 mL via PERINEURAL

## 2023-12-15 MED ORDER — BUPIVACAINE LIPOSOME 1.3 % IJ SUSP
INTRAMUSCULAR | Status: DC | PRN
  Administered 2023-12-15: 10 mL

## 2023-12-15 MED ORDER — VANCOMYCIN HCL IN DEXTROSE 1-5 GM/200ML-% IV SOLN
INTRAVENOUS | Status: AC
  Filled 2023-12-15: qty 200

## 2023-12-15 MED ORDER — DOXYCYCLINE HYCLATE 100 MG PO TABS: 100.0000 mg | ORAL_TABLET | Freq: Two times a day (BID) | ORAL | 0 refills | Status: DC

## 2023-12-15 MED ORDER — BUPIVACAINE HCL (PF) 0.5 % IJ SOLN
INTRAMUSCULAR | Status: AC
  Filled 2023-12-15: qty 10

## 2023-12-15 MED ORDER — VANCOMYCIN HCL IN DEXTROSE 1-5 GM/200ML-% IV SOLN
1000.0000 mg | Freq: Once | INTRAVENOUS | Status: AC
  Administered 2023-12-15: 1000 mg via INTRAVENOUS

## 2023-12-15 MED ORDER — ACETAMINOPHEN 10 MG/ML IV SOLN
INTRAVENOUS | Status: AC
  Filled 2023-12-15: qty 100

## 2023-12-15 MED ORDER — FENTANYL CITRATE (PF) 100 MCG/2ML IJ SOLN
INTRAMUSCULAR | Status: AC
  Filled 2023-12-15: qty 2

## 2023-12-15 MED ORDER — ONDANSETRON HCL 4 MG PO TABS: 4.0000 mg | ORAL_TABLET | Freq: Three times a day (TID) | ORAL | 0 refills | Status: DC | PRN

## 2023-12-15 MED ORDER — LIDOCAINE HCL (CARDIAC) PF 100 MG/5ML IV SOSY
PREFILLED_SYRINGE | INTRAVENOUS | Status: DC | PRN
  Administered 2023-12-15: 40 mg via INTRAVENOUS

## 2023-12-15 MED ORDER — DROPERIDOL 2.5 MG/ML IJ SOLN: 0.6250 mg | Freq: Once | INTRAMUSCULAR | Status: DC | PRN

## 2023-12-15 MED ORDER — MIDAZOLAM HCL 2 MG/2ML IJ SOLN
1.0000 mg | INTRAMUSCULAR | Status: DC | PRN
Start: 2023-12-15 — End: 2023-12-15
  Administered 2023-12-15: 1 mg via INTRAVENOUS

## 2023-12-15 MED ORDER — ACETAMINOPHEN 10 MG/ML IV SOLN
INTRAVENOUS | Status: DC | PRN
  Administered 2023-12-15: 1000 mg via INTRAVENOUS

## 2023-12-15 MED ORDER — GLYCOPYRROLATE 0.2 MG/ML IJ SOLN
INTRAMUSCULAR | Status: DC | PRN
  Administered 2023-12-15: .2 mg via INTRAVENOUS

## 2023-12-15 MED ORDER — FENTANYL CITRATE (PF) 100 MCG/2ML IJ SOLN
INTRAMUSCULAR | Status: DC | PRN
  Administered 2023-12-15: 50 ug via INTRAVENOUS

## 2023-12-15 MED ORDER — CHLORHEXIDINE GLUCONATE 0.12 % MT SOLN
OROMUCOSAL | Status: AC
  Filled 2023-12-15: qty 15

## 2023-12-15 SURGICAL SUPPLY — 35 items
ANCHOR SPDBRG KL ACHILLES 3.9 (Anchor) IMPLANT
BLADE SURG 15 STRL LF DISP TIS (BLADE) IMPLANT
BLADE SW THK.38XMED LNG THN (BLADE) IMPLANT
BNDG ELASTIC 4X5.8 VLCR NS LF (GAUZE/BANDAGES/DRESSINGS) IMPLANT
BNDG ELASTIC 6X5.8 VLCR NS LF (GAUZE/BANDAGES/DRESSINGS) IMPLANT
BNDG ESMARCH 4X12 STRL LF (GAUZE/BANDAGES/DRESSINGS) ×1 IMPLANT
BNDG GAUZE DERMACEA FLUFF 4 (GAUZE/BANDAGES/DRESSINGS) IMPLANT
COVER LIGHT HANDLE STERIS (MISCELLANEOUS) ×2 IMPLANT
CUFF TRNQT CYL 34X4.125X (TOURNIQUET CUFF) IMPLANT
DRAPE FLUOR MINI C-ARM 54X84 (DRAPES) ×1 IMPLANT
DRSG XEROFORM 1X8 (GAUZE/BANDAGES/DRESSINGS) IMPLANT
DURAPREP 26ML APPLICATOR (WOUND CARE) ×1 IMPLANT
ELECT REM PT RETURN 9FT ADLT (ELECTROSURGICAL) ×1 IMPLANT
ELECTRODE REM PT RTRN 9FT ADLT (ELECTROSURGICAL) ×1 IMPLANT
GAUZE SPONGE 4X4 12PLY STRL (GAUZE/BANDAGES/DRESSINGS) IMPLANT
GLOVE BIO SURGEON STRL SZ7 (GLOVE) ×1 IMPLANT
GLOVE BIOGEL PI IND STRL 7.5 (GLOVE) ×1 IMPLANT
GOWN STRL REUS W/ TWL LRG LVL3 (GOWN DISPOSABLE) ×2 IMPLANT
KIT TURNOVER KIT A (KITS) ×1 IMPLANT
MANIFOLD NEPTUNE II (INSTRUMENTS) ×1 IMPLANT
NS IRRIG 500ML POUR BTL (IV SOLUTION) ×1 IMPLANT
PACK EXTREMITY ARMC (MISCELLANEOUS) ×1 IMPLANT
PAD ABD DERMACEA PRESS 5X9 (GAUZE/BANDAGES/DRESSINGS) IMPLANT
PAD CAST CTTN 4X4 STRL (SOFTGOODS) IMPLANT
PENCIL SMOKE EVACUATOR (MISCELLANEOUS) ×1 IMPLANT
RASP SM TEAR CROSS CUT (RASP) IMPLANT
SPLINT CAST 1 STEP 4X30 (MISCELLANEOUS) IMPLANT
SPLINT CAST 1 STEP 5X30 WHT (MISCELLANEOUS) IMPLANT
STOCKINETTE IMPERVIOUS 9X36 MD (GAUZE/BANDAGES/DRESSINGS) ×1 IMPLANT
SUT ETHILON 3-0 FS-10 30 BLK (SUTURE) ×1 IMPLANT
SUT VIC AB 3-0 SH 27X BRD (SUTURE) IMPLANT
SUTURE EHLN 3-0 FS-10 30 BLK (SUTURE) IMPLANT
TRAP FLUID SMOKE EVACUATOR (MISCELLANEOUS) ×1 IMPLANT
WAND TOPAZ MICRO DEBRIDER (MISCELLANEOUS) IMPLANT
WATER STERILE IRR 500ML POUR (IV SOLUTION) ×1 IMPLANT

## 2023-12-15 NOTE — Anesthesia Postprocedure Evaluation (Signed)
 Anesthesia Post Note  Patient: RAYLYNN HERSH  Procedure(s) Performed: RECESSION, TENDON, GASTROCNEMIUS (Left: Foot) TRANSFER, TENDON (Left: Foot) REPAIR, TENDON, ACHILLES (Left: Foot) OSTECTOMY (Left: Foot)  Patient location during evaluation: PACU Anesthesia Type: General Level of consciousness: awake and alert Pain management: pain level controlled Vital Signs Assessment: post-procedure vital signs reviewed and stable Respiratory status: spontaneous breathing, nonlabored ventilation, respiratory function stable and patient connected to nasal cannula oxygen Cardiovascular status: blood pressure returned to baseline and stable Postop Assessment: no apparent nausea or vomiting Anesthetic complications: no   No notable events documented.   Last Vitals:  Vitals:   12/15/23 1545 12/15/23 1600  BP: (!) 161/94 (!) 152/83  Pulse: 80 81  Resp: 18 15  Temp:    SpO2: 99% 91%    Last Pain:  Vitals:   12/15/23 1535  TempSrc:   PainSc: 0-No pain                 Louie Boston

## 2023-12-15 NOTE — Anesthesia Preprocedure Evaluation (Signed)
 Anesthesia Evaluation  Patient identified by MRN, date of birth, ID band Patient awake    Reviewed: Allergy & Precautions, H&P , NPO status , Patient's Chart, lab work & pertinent test results  History of Anesthesia Complications Negative for: history of anesthetic complications  Airway Mallampati: III  TM Distance: >3 FB     Dental  (+) Dental Advidsory Given, Teeth Intact Bridge on the top left:   Pulmonary shortness of breath and with exertion, asthma , sleep apnea and Continuous Positive Airway Pressure Ventilation , neg COPD, neg recent URI   breath sounds clear to auscultation       Cardiovascular Exercise Tolerance: Good hypertension, (-) angina + CAD  (-) Past MI and (-) Cardiac Stents (-) dysrhythmias (-) Valvular Problems/Murmurs Rhythm:regular Rate:Normal     Neuro/Psych  Headaches, neg Seizures PSYCHIATRIC DISORDERS Anxiety Depression  Schizophrenia  Chronic pain TIA   GI/Hepatic Neg liver ROS,GERD  Controlled,,  Endo/Other  diabetes (borderline)  Class 4 obesity  Renal/GU negative Renal ROS  negative genitourinary   Musculoskeletal   Abdominal   Peds  Hematology negative hematology ROS (+)   Anesthesia Other Findings Past Medical History: No date: Allergy No date: Anemia No date: Asthma No date: Benign essential hypertension No date: Hyperlipidemia No date: Lumbar spondylosis No date: Obesity No date: Reflux No date: Right leg DVT (HCC)  Past Surgical History: No date: ABDOMINAL HYSTERECTOMY No date: CHOLECYSTECTOMY 06/16/2017: COLONOSCOPY WITH PROPOFOL; N/A     Comment:  Procedure: COLONOSCOPY WITH PROPOFOL;  Surgeon: Toney Reil, MD;  Location: [facility] ENDOSCOPY;  Service:               Gastroenterology;  Laterality: N/A; No date: HEEL SPUR EXCISION No date: HERNIA REPAIR No date: KELOID EXCISION     Reproductive/Obstetrics negative OB ROS                              Anesthesia Physical Anesthesia Plan  ASA: 3  Anesthesia Plan: General   Post-op Pain Management: Regional block*   Induction: Intravenous  PONV Risk Score and Plan: 3 and Ondansetron, Dexamethasone and Treatment may vary due to age or medical condition  Airway Management Planned: Oral ETT  Additional Equipment:   Intra-op Plan:   Post-operative Plan: Extubation in OR  Informed Consent: I have reviewed the patients History and Physical, chart, labs and discussed the procedure including the risks, benefits and alternatives for the proposed anesthesia with the patient or authorized representative who has indicated his/her understanding and acceptance.     Dental Advisory Given  Plan Discussed with: Anesthesiologist, CRNA and Surgeon  Anesthesia Plan Comments:         Anesthesia Quick Evaluation

## 2023-12-15 NOTE — H&P (Signed)
 HISTORY AND PHYSICAL INTERVAL NOTE:  12/15/2023  10:00 AM  Lindsay Cooke  has presented today for surgery, with the diagnosis of Pes equinus, acquired, left M21.6X2 Osteophyte of left foot M25.775 Calcific tendinitis, other site M65.28 Synovitis and tenosynovitis M65.90 Tendonitis, Achilles, left M76.62 Calcaneal spur of left foot M77.32.  The various methods of treatment have been discussed with the patient.  No guarantees were given.  After consideration of risks, benefits and other options for treatment, the patient has consented to surgery.  I have reviewed the patients' chart and labs.    PROCEDURE: LEFT POSTERIOR HEEL SPUR RESECTION WITH PARTIAL DETACHMENT AND REATTACHMENT LEFT ACHILLES TENDON DEBRIDEMENT LEFT POSSIBLE FHL TENDON TRANSFER LEFT POSSIBLE GASTROC RECESSION   A history and physical examination was performed in my office.  The patient was reexamined.  There have been no changes to this history and physical examination.  Rosetta Posner, DPM

## 2023-12-15 NOTE — Transfer of Care (Signed)
 Immediate Anesthesia Transfer of Care Note  Patient: Lindsay Cooke  Procedure(s) Performed: RECESSION, TENDON, GASTROCNEMIUS (Left: Foot) TRANSFER, TENDON (Left: Foot) REPAIR, TENDON, ACHILLES (Left: Foot) OSTECTOMY (Left: Foot)  Patient Location: PACU  Anesthesia Type:General  Level of Consciousness: awake, drowsy, and patient cooperative  Airway & Oxygen Therapy: Patient Spontanous Breathing and Patient connected to face mask oxygen  Post-op Assessment: Report given to RN and Post -op Vital signs reviewed and stable  Post vital signs: Reviewed and stable  Last Vitals:  Vitals Value Taken Time  BP 149/86 12/15/23 1535  Temp 36.5 C 12/15/23 1535  Pulse 80 12/15/23 1545  Resp 18 12/15/23 1545  SpO2 99 % 12/15/23 1545  Vitals shown include unfiled device data.  Last Pain:  Vitals:   12/15/23 0951  TempSrc: Temporal  PainSc: 0-No pain         Complications: No notable events documented.

## 2023-12-15 NOTE — Anesthesia Procedure Notes (Signed)
 Procedure Name: Intubation Date/Time: 12/15/2023 1:32 PM  Performed by: Mohammed Kindle, CRNAPre-anesthesia Checklist: Patient identified, Emergency Drugs available, Suction available and Patient being monitored Patient Re-evaluated:Patient Re-evaluated prior to induction Oxygen Delivery Method: Circle system utilized Preoxygenation: Pre-oxygenation with 100% oxygen Induction Type: IV induction Ventilation: Mask ventilation without difficulty Laryngoscope Size: McGrath and 3 Grade View: Grade I Tube type: Oral Tube size: 7.5 mm Number of attempts: 1 Airway Equipment and Method: Stylet Placement Confirmation: ETT inserted through vocal cords under direct vision, positive ETCO2 and breath sounds checked- equal and bilateral Secured at: 21 cm Tube secured with: Tape Dental Injury: Teeth and Oropharynx as per pre-operative assessment

## 2023-12-15 NOTE — Discharge Instructions (Signed)
  [facility]  POST OPERATIVE INSTRUCTIONS FOR DR. Ether Griffins AND DR. Anye Brose [facility] CLINIC PODIATRY DEPARTMENT   Take your medication as prescribed.  Pain medication should be taken only as needed.  May additionally take Tylenol or ibuprofen as needed for pain relief.  If still having significant pain recommend taking 2 tablets of pain medicine every 6 hours.  If still having pain that is significant and severe take 2 tablets of pain medication every 4 hours.  Maximum dose of Tylenol per day is 4000 mg, maximum dose of ibuprofen is 3200 mg a day.  Keep the dressing clean, dry and intact.  Remain nonweightbearing at all times left lower extremity.  Keep your foot elevated above the heart level for the first 48 hours.  Continue elevation thereafter to improve swelling.  May apply ice to the back of the left knee for maximum 10 minutes out of every 1 hour as needed for pain relief and swelling relief.  Walking to the bathroom and brief periods of walking are acceptable, unless we have instructed you to be non-weight bearing.  Always wear your post-op shoe when walking.  Always use your crutches or knee scooter if you are to be non-weight bearing.  Do not take a shower. Baths are permissible as long as the foot is kept out of the water.   Every hour you are awake:  Bend your knee 15 times. Massage calf 15 times  Call [facility] [phone]) if any of the following problems occur: You develop a temperature or fever. The bandage becomes saturated with blood. Medication does not stop your pain. Injury of the foot occurs. Any symptoms of infection including redness, odor, or red streaks running from wound.  Restart blood thinner medications 24 hours after procedure is complete.  Take antibiotics as prescribed until gone.

## 2023-12-15 NOTE — Anesthesia Procedure Notes (Signed)
 Anesthesia Regional Block: Popliteal block   Pre-Anesthetic Checklist: , timeout performed,  Correct Patient, Correct Site, Correct Laterality,  Correct Procedure, Correct Position, site marked,  Risks and benefits discussed,  Surgical consent,  Pre-op evaluation,  At surgeon's request and post-op pain management  Laterality: Lower and Left  Prep: chloraprep       Needles:  Injection technique: Single-shot  Needle Type: Echogenic Needle     Needle Length: 9cm  Needle Gauge: 21     Additional Needles:   Procedures:,,,, ultrasound used (permanent image in chart),,    Narrative:  Start time: 12/15/2023 12:17 PM End time: 12/15/2023 12:18 PM Injection made incrementally with aspirations every 5 mL.  Performed by: Personally  Anesthesiologist: Lenard Simmer, MD  Additional Notes: Patient consented for risk and benefits of nerve block including but not limited to nerve damage, failed block, bleeding and infection.  Patient voiced understanding.  Functioning IV was confirmed and monitors were applied.  Timeout done prior to procedure and prior to any sedation being given to the patient.  Patient confirmed procedure site prior to any sedation given to the patient.  A 50mm 22ga Stimuplex needle was used. Sterile prep,hand hygiene and sterile gloves were used.  Minimal sedation used for procedure.  No paresthesia endorsed by patient during the procedure.  Negative aspiration and negative test dose prior to incremental administration of local anesthetic. The patient tolerated the procedure well with no immediate complications.

## 2023-12-15 NOTE — Op Note (Signed)
 PODIATRY / FOOT AND ANKLE SURGERY OPERATIVE REPORT    SURGEON: Rosetta Posner, DPM  PRE-OPERATIVE DIAGNOSIS:  1.  Left insertional Achilles tendinitis 2.  Left calcaneal heel spur, Haglund's deformity  POST-OPERATIVE DIAGNOSIS: Same  PROCEDURE(S): Left Achilles tendon debridement with partial detachment and reattachment Removal of left calcaneal heel spur and Haglund's deformity  HEMOSTASIS: Left thigh tourniquet  ANESTHESIA: general  ESTIMATED BLOOD LOSS: 5 cc  FINDING(S): 1.  Large posterior heel spur and Haglund's deformity at the insertion point of the Achilles tendon 2.  Increased thickness of the Achilles tendon at the insertion point, less than 25% disease  PATHOLOGY/SPECIMEN(S): None  INDICATIONS:   RENEISHA STILLEY is a 63 y.o. female who presents with chronic left posterior heel pain.  She had similar issues on the right side and has performed conservative measures and underwent a right Achilles debridement with partial detachment and reattachment with removal of heel spur and did well with this.  Patient presents today for this procedure as all conservative measures have been exhausted and patient continues to have pain.  All treatment options were discussed with the patient both conservative and surgical attempts at correction include potential risks and complications at this time patient is elected for surgical invention today consisting of left posterior heel spur resection with Achilles tendon debridement with partial detachment and reattachment.  No guarantees given.  Consent obtained prior to procedure.  Patient was additionally consented for gastroc recession if needed as well as FHL tendon transfer.  DESCRIPTION: After obtaining full informed written consent, the patient was brought back to the operating room and placed supine upon the operating table.  The patient received IV antibiotics prior to induction.  After obtaining adequate anesthesia, the patient was prepped  and draped in the standard fashion.  The left lower extremity was exsanguinated and pneumatic thigh tourniquet was inflated.  Preoperatively anesthesia performed popliteal nerve block.  Attention was directed the posterior aspect the left heel where an incision was made through the midline the Achilles tendon and midline of the posterior heel.  The incision was deepened to the subcutaneous tissues utilizing sharp blunt dissection care was taken to identify and retract all vital neural and vascular structures and all venous contributories were cauterized as necessary.  At this time a midline incision was made through the Achilles tendon and was teed at the insertion point on the posterior heel.  The medial and lateral tendon slips were elevated off the calcaneus to expose the calcaneus at the heel spur as well as Haglund's deformity area.  The retrocalcaneal bursa was resected and passed off the operative site.  A large posterior heel spur was resected with a sagittal bone saw and passed off the operative site.  This was contoured to a normal surface by combination of rongeur and power rasp.  The Haglund's deformity was then also resected with a sagittal bone saw.  It was then contoured to a more normal rounded surface with the rongeur and power rasp as well.  C-arm imaging was utilized to verify resection of heel spur being adequate as well as hallux limitus deformity.  Overall appeared to be excellent and in good position compared to preop.  Attention was directed to the Achilles tendon where the tendon was debrided removing any thick fibrous tendon present at the insertion.  A little bit less than 25% of the total tendon was debulked at this area to healthy normal fibers.  The Topaz micro debridement wand was then used to microdebrider  the Achilles tendon at further at the insertion point.  At this time attention was directed to the posterior calcaneus area where the marking pen was used to mark out spots to  place the anchors.  FHL tendon transfer was not needed as less than 50% of the tendon was debulked overall.  At this time the distal holes were then made for the suture anchor from [employer].  The tap was then used to complete the preparation.  The all suture proximal rows were then placed and the anchors appeared to sit excellently.  The suture was then taken and passed through its corresponding slips at the medial and lateral Achilles tendon slips/segments with suture from the medial and lateral slips.  Once the suture was and tacked in place through the ripstop Arthrex suture was then applied placing the Achilles tendon in position to where it was now anchored to the calcaneus through the anchor system.  This appeared to recede the Achilles tendon very well.  1 suture was then taken from each suture anchor and crossed and placed into the more distal hole with the appropriate tension.  The same thing was then performed with the other limbs of the suture completing the construct.  2 anchors were placed distally and 2 were placed proximally the 2 proximal row just made out of suture.  All products were from [employer] today.  The tendon appeared to sit well overall.  The patient's equinus appeared to be improved as the Achilles tendon was advanced from its insertion point, it was decided to not perform a gastroc recession either at this time.  The surgical site was flushed with copious amounts normal sterile saline.  The Achilles tendon was then reapproximated with 3-0 Vicryl in a running interconnected stitch.  This appeared to approximate the tendon very well.  The peritenon was then reapproximated well coapted with 3-0 Vicryl as well as the subcutaneous tissue.  The skin was then reapproximated well coapted with 3-0 nylon horizontal mattress type stitching.  10 cc of Exparel was injected about the operative area.  The pneumatic thigh tourniquet was deflated and a prompt hyperemic response was noted all digits left  foot.  A postoperative dressing is applied consisting of Xeroform followed by 4 x 4 gauze, gauze roll, Webril, posterior splint and U sugar-tong splint, and Ace wrap.  The patient tolerated the procedure and anesthesia well and was transferred to the recovery in vital signs stable vascular status intact all toes left foot.  Following period of postoperative monitoring the patient be discharged home with the appropriate orders, instructions, medications.  Patient is to remain nonweightbearing to left lower extremity at all times and restart blood thinner medications 24 hours after procedure.  Patient to follow-up in outpatient clinic in 1 week from discharge date.  COMPLICATIONS: None  CONDITION: Good, stable  Rosetta Posner, DPM

## 2023-12-16 ENCOUNTER — Encounter: Payer: Self-pay | Admitting: Podiatry

## 2023-12-21 ENCOUNTER — Encounter

## 2023-12-28 ENCOUNTER — Other Ambulatory Visit

## 2023-12-30 DIAGNOSIS — M7662 Achilles tendinitis, left leg: Secondary | ICD-10-CM | POA: Diagnosis not present

## 2023-12-30 DIAGNOSIS — M7732 Calcaneal spur, left foot: Secondary | ICD-10-CM | POA: Diagnosis not present

## 2023-12-30 DIAGNOSIS — M79672 Pain in left foot: Secondary | ICD-10-CM | POA: Diagnosis not present

## 2024-01-04 ENCOUNTER — Other Ambulatory Visit: Payer: Self-pay | Admitting: Internal Medicine

## 2024-01-04 DIAGNOSIS — G4733 Obstructive sleep apnea (adult) (pediatric): Secondary | ICD-10-CM | POA: Diagnosis not present

## 2024-01-04 DIAGNOSIS — K219 Gastro-esophageal reflux disease without esophagitis: Secondary | ICD-10-CM

## 2024-01-05 ENCOUNTER — Other Ambulatory Visit: Payer: Self-pay

## 2024-01-05 DIAGNOSIS — G4733 Obstructive sleep apnea (adult) (pediatric): Secondary | ICD-10-CM | POA: Diagnosis not present

## 2024-01-05 DIAGNOSIS — K219 Gastro-esophageal reflux disease without esophagitis: Secondary | ICD-10-CM

## 2024-01-05 MED ORDER — LOSARTAN POTASSIUM 100 MG PO TABS: 100.0000 mg | ORAL_TABLET | Freq: Every day | ORAL | 3 refills | Status: AC

## 2024-01-05 MED ORDER — PANTOPRAZOLE SODIUM 40 MG PO TBEC: 40.0000 mg | DELAYED_RELEASE_TABLET | Freq: Every day | ORAL | 1 refills | Status: DC

## 2024-01-06 DIAGNOSIS — M7662 Achilles tendinitis, left leg: Secondary | ICD-10-CM | POA: Diagnosis not present

## 2024-01-10 ENCOUNTER — Ambulatory Visit: Admitting: Cardiovascular Disease

## 2024-01-11 ENCOUNTER — Encounter (HOSPITAL_COMMUNITY): Payer: Self-pay

## 2024-01-12 ENCOUNTER — Ambulatory Visit: Admitting: Cardiology

## 2024-01-18 ENCOUNTER — Ambulatory Visit
Admission: RE | Admit: 2024-01-18 | Discharge: 2024-01-18 | Disposition: A | Discharge status: Home / Self Care | Payer: Self-pay | Source: Ambulatory Visit | Attending: Cardiology | Admitting: Cardiology

## 2024-01-18 DIAGNOSIS — Z1231 Encounter for screening mammogram for malignant neoplasm of breast: Secondary | ICD-10-CM | POA: Diagnosis not present

## 2024-01-23 ENCOUNTER — Ambulatory Visit: Payer: Self-pay | Admitting: Cardiology

## 2024-01-27 DIAGNOSIS — M79672 Pain in left foot: Secondary | ICD-10-CM | POA: Diagnosis not present

## 2024-01-27 DIAGNOSIS — M7732 Calcaneal spur, left foot: Secondary | ICD-10-CM | POA: Diagnosis not present

## 2024-01-27 DIAGNOSIS — M7662 Achilles tendinitis, left leg: Secondary | ICD-10-CM | POA: Diagnosis not present

## 2024-02-01 ENCOUNTER — Other Ambulatory Visit: Payer: Self-pay | Admitting: Cardiovascular Disease

## 2024-02-01 ENCOUNTER — Other Ambulatory Visit: Payer: Self-pay | Admitting: Family

## 2024-02-01 ENCOUNTER — Other Ambulatory Visit: Payer: Self-pay | Admitting: Internal Medicine

## 2024-02-02 DIAGNOSIS — M6281 Muscle weakness (generalized): Secondary | ICD-10-CM | POA: Diagnosis not present

## 2024-02-02 DIAGNOSIS — M25572 Pain in left ankle and joints of left foot: Secondary | ICD-10-CM | POA: Diagnosis not present

## 2024-02-02 DIAGNOSIS — M25672 Stiffness of left ankle, not elsewhere classified: Secondary | ICD-10-CM | POA: Diagnosis not present

## 2024-02-03 ENCOUNTER — Other Ambulatory Visit

## 2024-02-03 ENCOUNTER — Ambulatory Visit: Admitting: Cardiology

## 2024-02-03 DIAGNOSIS — I10 Essential (primary) hypertension: Secondary | ICD-10-CM | POA: Diagnosis not present

## 2024-02-03 DIAGNOSIS — R7303 Prediabetes: Secondary | ICD-10-CM | POA: Diagnosis not present

## 2024-02-03 DIAGNOSIS — E7849 Other hyperlipidemia: Secondary | ICD-10-CM

## 2024-02-04 LAB — CMP14+EGFR
ALT: 12 IU/L (ref 0–32)
AST: 14 IU/L (ref 0–40)
Albumin: 4.4 g/dL (ref 3.8–4.9)
Alkaline Phosphatase: 61 IU/L (ref 44–121)
BUN/Creatinine Ratio: 9 — ABNORMAL LOW (ref 12–28)
BUN: 12 mg/dL (ref 8–27)
Bilirubin Total: 0.7 mg/dL (ref 0.0–1.2)
CO2: 23 mmol/L (ref 20–29)
Calcium: 9.4 mg/dL (ref 8.7–10.3)
Chloride: 105 mmol/L (ref 96–106)
Creatinine, Ser: 1.3 mg/dL — ABNORMAL HIGH (ref 0.57–1.00)
Globulin, Total: 2 g/dL (ref 1.5–4.5)
Glucose: 122 mg/dL — ABNORMAL HIGH (ref 70–99)
Potassium: 4.1 mmol/L (ref 3.5–5.2)
Sodium: 144 mmol/L (ref 134–144)
Total Protein: 6.4 g/dL (ref 6.0–8.5)
eGFR: 47 mL/min/{1.73_m2} — ABNORMAL LOW (ref >59)

## 2024-02-04 LAB — LIPID PANEL
Chol/HDL Ratio: 2.2 ratio (ref 0.0–4.4)
Cholesterol, Total: 146 mg/dL (ref 100–199)
HDL: 65 mg/dL (ref >39)
LDL Chol Calc (NIH): 66 mg/dL (ref 0–99)
Triglycerides: 76 mg/dL (ref 0–149)
VLDL Cholesterol Cal: 15 mg/dL (ref 5–40)

## 2024-02-04 LAB — HEMOGLOBIN A1C
Est. average glucose Bld gHb Est-mCnc: 114 mg/dL
Hgb A1c MFr Bld: 5.6 % (ref 4.8–5.6)

## 2024-02-04 LAB — TSH: TSH: 2.21 u[IU]/mL (ref 0.450–4.500)

## 2024-02-05 DIAGNOSIS — G4733 Obstructive sleep apnea (adult) (pediatric): Secondary | ICD-10-CM | POA: Diagnosis not present

## 2024-02-06 ENCOUNTER — Ambulatory Visit: Payer: Self-pay | Admitting: Cardiology

## 2024-02-07 ENCOUNTER — Encounter: Payer: Self-pay | Admitting: Cardiology

## 2024-02-07 ENCOUNTER — Ambulatory Visit (INDEPENDENT_AMBULATORY_CARE_PROVIDER_SITE_OTHER): Admitting: Cardiology

## 2024-02-07 VITALS — BP 102/62 | HR 88 | Ht 61.0 in | Wt 313.4 lb

## 2024-02-07 DIAGNOSIS — Z713 Dietary counseling and surveillance: Secondary | ICD-10-CM | POA: Diagnosis not present

## 2024-02-07 DIAGNOSIS — E785 Hyperlipidemia, unspecified: Secondary | ICD-10-CM

## 2024-02-07 DIAGNOSIS — I10 Essential (primary) hypertension: Secondary | ICD-10-CM

## 2024-02-07 DIAGNOSIS — Z6841 Body Mass Index (BMI) 40.0 and over, adult: Secondary | ICD-10-CM

## 2024-02-07 DIAGNOSIS — G4733 Obstructive sleep apnea (adult) (pediatric): Secondary | ICD-10-CM

## 2024-02-07 DIAGNOSIS — L910 Hypertrophic scar: Secondary | ICD-10-CM | POA: Diagnosis not present

## 2024-02-07 DIAGNOSIS — M25511 Pain in right shoulder: Secondary | ICD-10-CM

## 2024-02-07 DIAGNOSIS — E782 Mixed hyperlipidemia: Secondary | ICD-10-CM | POA: Diagnosis not present

## 2024-02-07 DIAGNOSIS — N1831 Chronic kidney disease, stage 3a: Secondary | ICD-10-CM

## 2024-02-07 MED ORDER — ZEPBOUND 2.5 MG/0.5ML ~~LOC~~ SOLN: 2.5000 mg | SUBCUTANEOUS | 4 refills | Status: DC

## 2024-02-07 NOTE — Progress Notes (Signed)
 Established Patient Office Visit  Subjective:  Patient ID: Lindsay Cooke, female    DOB: December 10, 1960  Age: 63 y.o. MRN: [id number]  Chief Complaint  Patient presents with   Follow-up    Pain in right arm/3 month lab results    Patient in office for 3 month follow up, discuss recent lab results.  Patient complaining of right shoulder pain. Patient fell on her shoulder in April 2025, xray ordered at that time by her cardiologist showed mild arthropathy. Will refer to orthopaedics.  Discussed recent lab work. Hgb A1c normal, no longer pre diabetic. LDL at goal. Worsening kidney function, will refer to nephrology.  Patient had shingles in the recent past. Patient unable to remember exactly when. Developed a painful, mobile, keloid near her right shoulder. Will send to general surgery for possible removal.  Pap smear 09/2021 negative.  Patient requesting assistance with weight loss. Was on Wegovy  with success, stopped due to insurance. Patient has a history of sleep apnea, will send in Zepbound .  Patient reports CPAP machine not working properly. Will send an order for a new one.     No other concerns at this time.   Past Medical History:  Diagnosis Date   Allergy    Anemia    Anxiety    Arthritis    Asthma    Benign essential hypertension    Calcaneal spur, left    Class 3 severe obesity with serious comorbidity and body mass index (BMI) of 60.0 to 69.9 in adult, unspecified obesity type    Depression    Dyspnea    Dysrhythmia    Equinus deformity of both feet    GERD (gastroesophageal reflux disease)    Hyperlipidemia    Left foot pain    Lumbar spondylosis    Obesity    Pneumonia    Pre-diabetes    Reflux    Right leg DVT (HCC)    Sleep apnea    no cpap   Stroke (HCC)    Tendonitis, Achilles, left     Past Surgical History:  Procedure Laterality Date   ABDOMINAL HYSTERECTOMY     ACHILLES TENDON SURGERY Left 12/15/2023   Procedure: REPAIR, TENDON, ACHILLES;   Surgeon: Pink Bridges, DPM;  Location: [facility] ORS;  Service: Orthopedics/Podiatry;  Laterality: Left;   CHOLECYSTECTOMY     COLONOSCOPY WITH PROPOFOL  N/A 06/16/2017   Procedure: COLONOSCOPY WITH PROPOFOL ;  Surgeon: Selena Daily, MD;  Location: [facility] ENDOSCOPY;  Service: Gastroenterology;  Laterality: N/A;   COLONOSCOPY WITH PROPOFOL  N/A 11/10/2020   Procedure: COLONOSCOPY WITH PROPOFOL ;  Surgeon: Selena Daily, MD;  Location: [facility] ENDOSCOPY;  Service: Gastroenterology;  Laterality: N/A;   GASTROC RECESSION EXTREMITY Left 12/15/2023   Procedure: RECESSION, TENDON, GASTROCNEMIUS;  Surgeon: Pink Bridges, DPM;  Location: [facility] ORS;  Service: Orthopedics/Podiatry;  Laterality: Left;   HEEL SPUR EXCISION     HERNIA REPAIR     KELOID EXCISION     OSTECTOMY Left 12/15/2023   Procedure: OSTECTOMY;  Surgeon: Pink Bridges, DPM;  Location: [facility] ORS;  Service: Orthopedics/Podiatry;  Laterality: Left;   TENDON TRANSFER Left 12/15/2023   Procedure: TRANSFER, TENDON;  Surgeon: Pink Bridges, DPM;  Location: [facility] ORS;  Service: Orthopedics/Podiatry;  Laterality: Left;    Social History   Socioeconomic History   Marital status: Single    Spouse name: Not on file   Number of children: Not on file   Years of education: Not on file   Highest education level: Not on  file  Occupational History   Not on file  Tobacco Use   Smoking status: Never   Smokeless tobacco: Never  Vaping Use   Vaping status: Never Used  Substance and Sexual Activity   Alcohol use: No    Alcohol/week: 0.0 standard drinks of alcohol   Drug use: No   Sexual activity: Never  Other Topics Concern   Not on file  Social History Narrative   Lives with daughter   Social Drivers of Health   Financial Resource Strain: Low Risk  (02/02/2024)   Received from [facility]   Overall Financial Resource Strain (CARDIA)    Difficulty of Paying Living Expenses: Not hard at all  Food Insecurity: Food Insecurity  Present (02/02/2024)   Received from [facility]   Hunger Vital Sign    Worried About Running Out of Food in the Last Year: Often true    Ran Out of Food in the Last Year: Never true  Transportation Needs: No Transportation Needs (02/02/2024)   Received from [facility] - Transportation    In the past 12 months, has lack of transportation kept you from medical appointments or from getting medications?: No    Lack of Transportation (Non-Medical): No  Physical Activity: Not on file  Stress: Not on file  Social Connections: Not on file  Intimate Partner Violence: Not on file    Family History  Problem Relation Age of Onset   Breast cancer Sister    Heart disease Mother    Congestive Heart Failure Sister     Allergies  Allergen Reactions   Penicillins Hives and Rash    Has patient had a PCN reaction causing immediate rash, facial/tongue/throat swelling, SOB or lightheadedness with hypotension: Yes Has patient had a PCN reaction causing severe rash involving mucus membranes or skin necrosis: No Has patient had a PCN reaction that required hospitalization No Has patient had a PCN reaction occurring within the last 10 years: No If all of the above answers are "NO", then may proceed with Cephalosporin use.   Metronidazole Rash    Other reaction(s): Skin Rashes, Hives    Outpatient Medications Prior to Visit  Medication Sig   acetaminophen  (TYLENOL ) 500 MG tablet Take 1,000 mg by mouth every 6 (six) hours as needed.   albuterol  (VENTOLIN  HFA) 108 (90 Base) MCG/ACT inhaler INHALE 1 TO 2 PUFFS BY MOUTH EVERY 6 HOURS AS NEEDED FOR SHORTNESS OF BREATH OR WHEEZING (RESCUE INHALER)   aspirin  81 MG chewable tablet Chew 81 mg by mouth daily.   atorvastatin  (LIPITOR) 20 MG tablet TAKE 1 TABLET BY MOUTH EVERY EVENING   cetirizine  (ZYRTEC ) 10 MG tablet TAKE 1 TABLET BY MOUTH NIGHTLY AT BEDTIME FOR SEASONAL ALLERGIES   clopidogrel  (PLAVIX ) 75 MG tablet  Take 1 tablet (75 mg total) by mouth daily.   diclofenac Sodium (VOLTAREN) 1 % GEL APPLY 2 GRAMS TO BILATERAL KNEES EVERY 8HOURS AS NEEDED FOR ARTHRITIS   doxycycline  (VIBRA -TABS) 100 MG tablet Take 1 tablet (100 mg total) by mouth 2 (two) times daily.   escitalopram  (LEXAPRO ) 10 MG tablet TAKE 1 TABLET BY MOUTH DAILY   gabapentin (NEURONTIN) 300 MG capsule Take 900 mg by mouth 2 (two) times daily.   hydrOXYzine  (ATARAX /VISTARIL ) 50 MG tablet Take 50 mg by mouth at bedtime.   LATUDA 60 MG TABS Take 1 tablet by mouth at bedtime.   losartan  (COZAAR ) 100 MG tablet Take 1 tablet (100 mg total)  by mouth daily.   montelukast  (SINGULAIR ) 10 MG tablet TAKE 1 TABLET BY MOUTH DAILY   naproxen  (NAPROSYN ) 500 MG tablet Take 1 tablet (500 mg total) by mouth 2 (two) times daily with a meal.   ondansetron  (ZOFRAN ) 4 MG tablet Take 1 tablet (4 mg total) by mouth every 8 (eight) hours as needed for nausea or vomiting.   pantoprazole  (PROTONIX ) 40 MG tablet TAKE 1 TABLET BY MOUTH DAILY   potassium chloride  SA (KLOR-CON  M) 20 MEQ tablet TAKE 1 TABLET BY MOUTH DAILY   REXULTI 2 MG TABS tablet Take 2 mg by mouth daily.   spironolactone (ALDACTONE) 25 MG tablet TAKE 1 TABLET BY MOUTH DAILY   zolpidem  (AMBIEN  CR) 6.25 MG CR tablet TAKE 1 TABLET BY MOUTH AT BEDTIME AS NEEDED FOR INSOMNIA   [DISCONTINUED] atorvastatin  (LIPITOR) 20 MG tablet Take 20 mg by mouth every evening. (Patient not taking: Reported on 02/07/2024)   [DISCONTINUED] pantoprazole  (PROTONIX ) 40 MG tablet Take 1 tablet (40 mg total) by mouth daily. (Patient not taking: Reported on 02/07/2024)   No facility-administered medications prior to visit.    Review of Systems  Constitutional: Negative.   HENT: Negative.    Eyes: Negative.   Respiratory: Negative.    Cardiovascular: Negative.   Gastrointestinal: Negative.   Genitourinary: Negative.   Musculoskeletal: Negative.   Skin: Negative.   Neurological: Negative.   Endo/Heme/Allergies: Negative.    Psychiatric/Behavioral: Negative.    All other systems reviewed and are negative.      Objective:   BP 102/62   Pulse 88   Ht 5\' 1"  (1.549 m)   Wt (!) 313 lb 6.4 oz (142.2 kg)   SpO2 100%   BMI 59.22 kg/m   Vitals:   02/07/24 1010  BP: 102/62  Pulse: 88  Height: 5\' 1"  (1.549 m)  Weight: (!) 313 lb 6.4 oz (142.2 kg)  SpO2: 100%  BMI (Calculated): 59.25    Physical Exam Vitals and nursing note reviewed.  Constitutional:      Appearance: Normal appearance. She is normal weight.  HENT:     Head: Normocephalic and atraumatic.     Nose: Nose normal.     Mouth/Throat:     Mouth: Mucous membranes are moist.  Eyes:     Extraocular Movements: Extraocular movements intact.     Conjunctiva/sclera: Conjunctivae normal.     Pupils: Pupils are equal, round, and reactive to light.  Cardiovascular:     Rate and Rhythm: Normal rate and regular rhythm.     Pulses: Normal pulses.     Heart sounds: Normal heart sounds.  Pulmonary:     Effort: Pulmonary effort is normal.     Breath sounds: Normal breath sounds.  Abdominal:     General: Abdomen is flat. Bowel sounds are normal.     Palpations: Abdomen is soft.  Musculoskeletal:        General: Normal range of motion.     Cervical back: Normal range of motion.  Skin:    General: Skin is warm and dry.  Neurological:     General: No focal deficit present.     Mental Status: She is alert and oriented to person, place, and time.  Psychiatric:        Mood and Affect: Mood normal.        Behavior: Behavior normal.        Thought Content: Thought content normal.        Judgment: Judgment normal.  No results found for any visits on 02/07/24.  Recent Results (from the past 2160 hours)  CBC     Status: None   Collection Time: 12/12/23 11:06 AM  Result Value Ref Range   WBC 4.2 4.0 - 10.5 K/uL   RBC 4.48 3.87 - 5.11 MIL/uL   Hemoglobin 12.9 12.0 - 15.0 g/dL   HCT 04.5 40.9 - 81.1 %   MCV 85.0 80.0 - 100.0 fL   MCH 28.8  26.0 - 34.0 pg   MCHC 33.9 30.0 - 36.0 g/dL   RDW 91.4 78.2 - 95.6 %   Platelets 224 150 - 400 K/uL   nRBC 0.0 0.0 - 0.2 %    Comment: Performed at [facility], [address], [place], [place] [address]  Basic metabolic panel     Status: Abnormal   Collection Time: 12/12/23 11:06 AM  Result Value Ref Range   Sodium 141 135 - 145 mmol/L   Potassium 4.3 3.5 - 5.1 mmol/L   Chloride 105 98 - 111 mmol/L   CO2 28 22 - 32 mmol/L   Glucose, Bld 93 70 - 99 mg/dL    Comment: Glucose reference range applies only to samples taken after fasting for at least 8 hours.   BUN 10 6 - 20 mg/dL   Creatinine, Ser 6.57 (H) 0.44 - 1.00 mg/dL   Calcium  9.4 8.9 - 10.3 mg/dL   GFR, Estimated 53 (L) >60 mL/min    Comment: (NOTE) Calculated using the CKD-EPI Creatinine Equation (2021)    Anion gap 8 5 - 15    Comment: Performed at [facility], [address] [place], [place] [address]  Hemoglobin A1c     Status: None   Collection Time: 02/03/24  9:24 AM  Result Value Ref Range   Hgb A1c MFr Bld 5.6 4.8 - 5.6 %    Comment:          Prediabetes: 5.7 - 6.4          Diabetes: >6.4          Glycemic control for adults with diabetes: <7.0    Est. average glucose Bld gHb Est-mCnc 114 mg/dL  EXB28+UXLK     Status: Abnormal   Collection Time: 02/03/24  9:24 AM  Result Value Ref Range   Glucose 122 (H) 70 - 99 mg/dL   BUN 12 8 - 27 mg/dL   Creatinine, Ser 4.40 (H) 0.57 - 1.00 mg/dL   eGFR 47 (L) >10 UV/OZD/6.64   BUN/Creatinine Ratio 9 (L) 12 - 28   Sodium 144 134 - 144 mmol/L   Potassium 4.1 3.5 - 5.2 mmol/L   Chloride 105 96 - 106 mmol/L   CO2 23 20 - 29 mmol/L   Calcium  9.4 8.7 - 10.3 mg/dL   Total Protein 6.4 6.0 - 8.5 g/dL   Albumin 4.4 3.8 - 4.9 g/dL   Globulin, Total 2.0 1.5 - 4.5 g/dL   Bilirubin Total 0.7 0.0 - 1.2 mg/dL   Alkaline Phosphatase 61 44 - 121 IU/L   AST 14 0 - 40 IU/L   ALT 12 0 - 32 IU/L  TSH     Status: None   Collection Time: 02/03/24  9:24 AM  Result  Value Ref Range   TSH 2.210 0.450 - 4.500 uIU/mL  Lipid panel     Status: None   Collection Time: 02/03/24  9:24 AM  Result Value Ref Range   Cholesterol, Total 146 100 - 199 mg/dL  Triglycerides 76 0 - 149 mg/dL   HDL 65 >86 mg/dL   VLDL Cholesterol Cal 15 5 - 40 mg/dL   LDL Chol Calc (NIH) 66 0 - 99 mg/dL   Chol/HDL Ratio 2.2 0.0 - 4.4 ratio    Comment:                                   T. Chol/HDL Ratio                                             Men  Women                               1/2 Avg.Risk  3.4    3.3                                   Avg.Risk  5.0    4.4                                2X Avg.Risk  9.6    7.1                                3X Avg.Risk 23.4   11.0       Assessment & Plan:  Referral sent to orthopaedics. Referral sent to nephrology. Referral sent to general surgery. Zepbound  sent to pharmacy.   Problem List Items Addressed This Visit       Cardiovascular and Mediastinum   Hypertension - Primary     Respiratory   OSA on CPAP   Relevant Medications   tirzepatide  (ZEPBOUND ) 2.5 MG/0.5ML injection vial     Musculoskeletal and Integument   Keloid scar   Relevant Orders   Ambulatory referral to General Surgery     Genitourinary   Stage 3a chronic kidney disease (HCC)   Relevant Orders   Ambulatory referral to Nephrology     Other   Dyslipidemia   Hyperlipidemia   Acute pain of right shoulder   Relevant Orders   Ambulatory referral to Orthopedic Surgery   BMI 50.0-59.9, adult (HCC)   Relevant Medications   tirzepatide  (ZEPBOUND ) 2.5 MG/0.5ML injection vial   Weight loss counseling, encounter for    Return in about 4 months (around 06/08/2024) for fasting labs prior.   Total time spent: 25 minutes  [employer], NP  02/07/2024   This document may have been prepared by Dragon Voice Recognition software and as such may include unintentional dictation errors.

## 2024-02-08 ENCOUNTER — Other Ambulatory Visit: Payer: Self-pay | Admitting: Family

## 2024-02-08 DIAGNOSIS — M25561 Pain in right knee: Secondary | ICD-10-CM | POA: Diagnosis not present

## 2024-02-08 DIAGNOSIS — Z8673 Personal history of transient ischemic attack (TIA), and cerebral infarction without residual deficits: Secondary | ICD-10-CM | POA: Diagnosis not present

## 2024-02-08 DIAGNOSIS — G8929 Other chronic pain: Secondary | ICD-10-CM | POA: Diagnosis not present

## 2024-02-08 DIAGNOSIS — G479 Sleep disorder, unspecified: Secondary | ICD-10-CM | POA: Diagnosis not present

## 2024-02-08 DIAGNOSIS — G4733 Obstructive sleep apnea (adult) (pediatric): Secondary | ICD-10-CM | POA: Diagnosis not present

## 2024-02-09 ENCOUNTER — Telehealth: Payer: Self-pay

## 2024-02-09 NOTE — Telephone Encounter (Signed)
 Pt LM asking for call back but did not state what it was for, could be the CAP papers she left or the PA I got this AM

## 2024-02-10 ENCOUNTER — Ambulatory Visit

## 2024-02-10 ENCOUNTER — Other Ambulatory Visit: Payer: Self-pay | Admitting: Cardiology

## 2024-02-10 DIAGNOSIS — I340 Nonrheumatic mitral (valve) insufficiency: Secondary | ICD-10-CM

## 2024-02-10 DIAGNOSIS — M25511 Pain in right shoulder: Secondary | ICD-10-CM

## 2024-02-10 DIAGNOSIS — I10 Essential (primary) hypertension: Secondary | ICD-10-CM

## 2024-02-10 DIAGNOSIS — E782 Mixed hyperlipidemia: Secondary | ICD-10-CM

## 2024-02-10 DIAGNOSIS — G479 Sleep disorder, unspecified: Secondary | ICD-10-CM

## 2024-02-10 DIAGNOSIS — I361 Nonrheumatic tricuspid (valve) insufficiency: Secondary | ICD-10-CM | POA: Diagnosis not present

## 2024-02-10 DIAGNOSIS — I371 Nonrheumatic pulmonary valve insufficiency: Secondary | ICD-10-CM

## 2024-02-10 DIAGNOSIS — R0602 Shortness of breath: Secondary | ICD-10-CM

## 2024-02-10 MED ORDER — OXYBUTYNIN CHLORIDE ER 10 MG PO TB24: 10.0000 mg | ORAL_TABLET | Freq: Every day | ORAL | 2 refills | Status: DC

## 2024-02-10 NOTE — Telephone Encounter (Signed)
 Patient informed.

## 2024-02-13 DIAGNOSIS — M25672 Stiffness of left ankle, not elsewhere classified: Secondary | ICD-10-CM | POA: Diagnosis not present

## 2024-02-13 DIAGNOSIS — M6281 Muscle weakness (generalized): Secondary | ICD-10-CM | POA: Diagnosis not present

## 2024-02-13 DIAGNOSIS — M25572 Pain in left ankle and joints of left foot: Secondary | ICD-10-CM | POA: Diagnosis not present

## 2024-02-14 ENCOUNTER — Ambulatory Visit (INDEPENDENT_AMBULATORY_CARE_PROVIDER_SITE_OTHER): Admitting: Cardiovascular Disease

## 2024-02-14 ENCOUNTER — Encounter: Payer: Self-pay | Admitting: Cardiovascular Disease

## 2024-02-14 VITALS — BP 136/68 | HR 50 | Ht 61.0 in | Wt 312.0 lb

## 2024-02-14 DIAGNOSIS — G4733 Obstructive sleep apnea (adult) (pediatric): Secondary | ICD-10-CM

## 2024-02-14 DIAGNOSIS — I10 Essential (primary) hypertension: Secondary | ICD-10-CM

## 2024-02-14 DIAGNOSIS — R0602 Shortness of breath: Secondary | ICD-10-CM | POA: Diagnosis not present

## 2024-02-14 DIAGNOSIS — E782 Mixed hyperlipidemia: Secondary | ICD-10-CM

## 2024-02-14 DIAGNOSIS — I5033 Acute on chronic diastolic (congestive) heart failure: Secondary | ICD-10-CM | POA: Diagnosis not present

## 2024-02-14 MED ORDER — DAPAGLIFLOZIN PROPANEDIOL 10 MG PO TABS: 10.0000 mg | ORAL_TABLET | Freq: Every day | ORAL | 3 refills | Status: DC

## 2024-02-14 NOTE — Progress Notes (Signed)
 Cardiology Office Note   Date:  02/14/2024   ID:  NAJAE FILSAIME, DOB 05-Nov-1960, MRN [id number]  PCP:  Alica Antu, NP  Cardiologist:  Debborah Fairly, MD      History of Present Illness: Lindsay Cooke is a 64 y.o. female who presents for  Chief Complaint  Patient presents with   Follow-up    ECHO Results    Has SOB      Past Medical History:  Diagnosis Date   Allergy    Anemia    Anxiety    Arthritis    Asthma    Benign essential hypertension    Calcaneal spur, left    Class 3 severe obesity with serious comorbidity and body mass index (BMI) of 60.0 to 69.9 in adult, unspecified obesity type    Depression    Dyspnea    Dysrhythmia    Equinus deformity of both feet    GERD (gastroesophageal reflux disease)    Hyperlipidemia    Left foot pain    Lumbar spondylosis    Obesity    Pneumonia    Pre-diabetes    Reflux    Right leg DVT (HCC)    Sleep apnea    no cpap   Stroke (HCC)    Tendonitis, Achilles, left      Past Surgical History:  Procedure Laterality Date   ABDOMINAL HYSTERECTOMY     ACHILLES TENDON SURGERY Left 12/15/2023   Procedure: REPAIR, TENDON, ACHILLES;  Surgeon: Pink Bridges, DPM;  Location: [facility]S;  Service: Orthopedics/Podiatry;  Laterality: Left;   CHOLECYSTECTOMY     COLONOSCOPY WITH PROPOFOL  N/A 06/16/2017   Procedure: COLONOSCOPY WITH PROPOFOL ;  Surgeon: Selena Daily, MD;  Location: [facility] ENDOSCOPY;  Service: Gastroenterology;  Laterality: N/A;   COLONOSCOPY WITH PROPOFOL  N/A 11/10/2020   Procedure: COLONOSCOPY WITH PROPOFOL ;  Surgeon: Selena Daily, MD;  Location: [facility] ENDOSCOPY;  Service: Gastroenterology;  Laterality: N/A;   GASTROC RECESSION EXTREMITY Left 12/15/2023   Procedure: RECESSION, TENDON, GASTROCNEMIUS;  Surgeon: Pink Bridges, DPM;  Location: [facility];  Service: Orthopedics/Podiatry;  Laterality: Left;   HEEL SPUR EXCISION     HERNIA REPAIR     KELOID EXCISION     OSTECTOMY Left 12/15/2023    Procedure: OSTECTOMY;  Surgeon: Pink Bridges, DPM;  Location: [facility];  Service: Orthopedics/Podiatry;  Laterality: Left;   TENDON TRANSFER Left 12/15/2023   Procedure: TRANSFER, TENDON;  Surgeon: Pink Bridges, DPM;  Location: [facility] ORS;  Service: Orthopedics/Podiatry;  Laterality: Left;     Current Outpatient Medications  Medication Sig Dispense Refill   acetaminophen  (TYLENOL ) 500 MG tablet Take 1,000 mg by mouth every 6 (six) hours as needed.     albuterol  (VENTOLIN  HFA) 108 (90 Base) MCG/ACT inhaler INHALE 1 TO 2 PUFFS BY MOUTH EVERY 6 HOURS AS NEEDED FOR SHORTNESS OF BREATH OR WHEEZING (RESCUE INHALER) 8.5 g 1   aspirin  81 MG chewable tablet Chew 81 mg by mouth daily.     atorvastatin  (LIPITOR) 20 MG tablet TAKE 1 TABLET BY MOUTH EVERY EVENING 90 tablet 3   cetirizine  (ZYRTEC ) 10 MG tablet TAKE 1 TABLET BY MOUTH NIGHTLY AT BEDTIME FOR SEASONAL ALLERGIES 30 tablet 3   clopidogrel  (PLAVIX ) 75 MG tablet Take 1 tablet (75 mg total) by mouth daily. 90 tablet 3   dapagliflozin propanediol (FARXIGA) 10 MG TABS tablet Take 1 tablet (10 mg total) by mouth daily before breakfast. 30 tablet 3   diclofenac Sodium (VOLTAREN) 1 % GEL APPLY  2 GRAMS TO BILATERAL KNEES EVERY 8HOURS AS NEEDED FOR ARTHRITIS 100 g 1   doxycycline  (VIBRA -TABS) 100 MG tablet Take 1 tablet (100 mg total) by mouth 2 (two) times daily. 14 tablet 0   escitalopram  (LEXAPRO ) 10 MG tablet TAKE 1 TABLET BY MOUTH DAILY 90 tablet 3   gabapentin (NEURONTIN) 300 MG capsule Take 900 mg by mouth 2 (two) times daily.     hydrOXYzine  (ATARAX /VISTARIL ) 50 MG tablet Take 50 mg by mouth at bedtime.     LATUDA 60 MG TABS Take 1 tablet by mouth at bedtime.     losartan  (COZAAR ) 100 MG tablet Take 1 tablet (100 mg total) by mouth daily. 90 tablet 3   montelukast  (SINGULAIR ) 10 MG tablet TAKE 1 TABLET BY MOUTH DAILY 90 tablet 1   naproxen  (NAPROSYN ) 500 MG tablet Take 1 tablet (500 mg total) by mouth 2 (two) times daily with a meal. 20 tablet 2    ondansetron  (ZOFRAN ) 4 MG tablet Take 1 tablet (4 mg total) by mouth every 8 (eight) hours as needed for nausea or vomiting. 20 tablet 0   oxybutynin  (DITROPAN  XL) 10 MG 24 hr tablet Take 1 tablet (10 mg total) by mouth daily. 30 tablet 2   pantoprazole  (PROTONIX ) 40 MG tablet TAKE 1 TABLET BY MOUTH DAILY 90 tablet 1   potassium chloride  SA (KLOR-CON  M) 20 MEQ tablet TAKE 1 TABLET BY MOUTH DAILY 90 tablet 1   REXULTI 2 MG TABS tablet Take 2 mg by mouth daily.     spironolactone (ALDACTONE) 25 MG tablet TAKE 1 TABLET BY MOUTH DAILY 90 tablet 0   tirzepatide  (ZEPBOUND ) 2.5 MG/0.5ML injection vial Inject 2.5 mg into the skin once a week. 2 mL 4   zolpidem  (AMBIEN  CR) 6.25 MG CR tablet TAKE 1 TABLET BY MOUTH AT BEDTIME AS NEEDED FOR INSOMNIA 30 tablet 0   No current facility-administered medications for this visit.    Allergies:   Penicillins and Metronidazole    Social History:   reports that she has never smoked. She has never used smokeless tobacco. She reports that she does not drink alcohol and does not use drugs.   Family History:  family history includes Breast cancer in her sister; Congestive Heart Failure in her sister; Heart disease in her mother.    ROS:     Review of Systems  Constitutional: Negative.   HENT: Negative.    Eyes: Negative.   Respiratory: Negative.    Gastrointestinal: Negative.   Genitourinary: Negative.   Musculoskeletal: Negative.   Skin: Negative.   Neurological: Negative.   Endo/Heme/Allergies: Negative.   Psychiatric/Behavioral: Negative.    All other systems reviewed and are negative.     All other systems are reviewed and negative.    PHYSICAL EXAM: VS:  BP 136/68   Pulse (!) 50   Ht 5\' 1"  (1.549 m)   Wt (!) 312 lb (141.5 kg)   SpO2 97%   BMI 58.95 kg/m  , BMI Body mass index is 58.95 kg/m. Last weight:  Wt Readings from Last 3 Encounters:  02/14/24 (!) 312 lb (141.5 kg)  02/07/24 (!) 313 lb 6.4 oz (142.2 kg)  12/13/23 (!) 317 lb  (143.8 kg)     Physical Exam Constitutional:      Appearance: Normal appearance.  Cardiovascular:     Rate and Rhythm: Normal rate and regular rhythm.     Heart sounds: Normal heart sounds.  Pulmonary:     Effort: Pulmonary effort is  normal.     Breath sounds: Normal breath sounds.  Musculoskeletal:     Right lower leg: No edema.     Left lower leg: No edema.  Neurological:     Mental Status: She is alert.       EKG:   Recent Labs: 12/12/2023: Hemoglobin 12.9; Platelets 224 02/03/2024: ALT 12; BUN 12; Creatinine, Ser 1.30; Potassium 4.1; Sodium 144; TSH 2.210    Lipid Panel    Component Value Date/Time   CHOL 146 02/03/2024 0924   TRIG 76 02/03/2024 0924   HDL 65 02/03/2024 0924   CHOLHDL 2.2 02/03/2024 0924   CHOLHDL 2.8 11/25/2015 0502   VLDL 10 11/25/2015 0502   LDLCALC 66 02/03/2024 0924      Other studies Reviewed: Additional studies/ records that were reviewed today include:  Review of the above records demonstrates:       No data to display            ASSESSMENT AND PLAN:    ICD-10-CM   1. Primary hypertension  I10 dapagliflozin propanediol (FARXIGA) 10 MG TABS tablet    2. Mixed hyperlipidemia  E78.2 dapagliflozin propanediol (FARXIGA) 10 MG TABS tablet    3. Shortness of breath  R06.02 dapagliflozin propanediol (FARXIGA) 10 MG TABS tablet   Has on echo diastolic dysfunction, trace MR/TR/PR, advise farxiga    4. Morbid obesity (HCC)  E66.01 dapagliflozin propanediol (FARXIGA) 10 MG TABS tablet    5. OSA on CPAP  G47.33 dapagliflozin propanediol (FARXIGA) 10 MG TABS tablet   has to have cpap checked and not wearing it.    6. CHF (congestive heart failure), NYHA class III, acute on chronic, diastolic (HCC)  I50.33    start farxiga       Problem List Items Addressed This Visit       Cardiovascular and Mediastinum   Hypertension - Primary   Relevant Medications   dapagliflozin propanediol (FARXIGA) 10 MG TABS tablet     Respiratory    OSA on CPAP   Relevant Medications   dapagliflozin propanediol (FARXIGA) 10 MG TABS tablet     Other   Morbid obesity (HCC)   Relevant Medications   dapagliflozin propanediol (FARXIGA) 10 MG TABS tablet   Hyperlipidemia   Relevant Medications   dapagliflozin propanediol (FARXIGA) 10 MG TABS tablet   Shortness of breath   Relevant Medications   dapagliflozin propanediol (FARXIGA) 10 MG TABS tablet   Other Visit Diagnoses       CHF (congestive heart failure), NYHA class III, acute on chronic, diastolic (HCC)       start farxiga          Disposition:   Return in about 5 weeks (around 03/20/2024).    Total time spent: 30 minutes  Signed,  Debborah Fairly, MD  02/14/2024 10:18 AM    [facility]

## 2024-02-15 ENCOUNTER — Encounter: Payer: Self-pay | Admitting: Surgery

## 2024-02-15 ENCOUNTER — Ambulatory Visit: Admitting: Surgery

## 2024-02-15 VITALS — BP 135/69 | HR 60 | Temp 97.6°F | Ht 61.0 in | Wt 312.0 lb

## 2024-02-15 DIAGNOSIS — L910 Hypertrophic scar: Secondary | ICD-10-CM | POA: Diagnosis not present

## 2024-02-15 NOTE — Patient Instructions (Signed)
Please call the office if you have any questions or concerns. 

## 2024-02-15 NOTE — Progress Notes (Signed)
 Patient ID: Lindsay Cooke, female   DOB: [birth year], 63 y.o.   MRN: [id number]  HPI Lindsay Cooke is a 63 y.o. female significant history of super morbid obesity, heart failure, history of stroke, anticoagulation.  She now presents with a keloid on the right arm.  She describes that she experiences intermittent pain that is sharp mild to moderate in intensity.  No specific alleviating or aggravating factors.  She is now recovering from a recent left foot procedure.  He has had multiple abdominal surgeries including hernia repair, cholecystectomy CBC nml  HPI  Past Medical History:  Diagnosis Date   Allergy    Anemia    Anxiety    Arthritis    Asthma    Benign essential hypertension    Calcaneal spur, left    Class 3 severe obesity with serious comorbidity and body mass index (BMI) of 60.0 to 69.9 in adult, unspecified obesity type    Depression    Dyspnea    Dysrhythmia    Equinus deformity of both feet    GERD (gastroesophageal reflux disease)    Hyperlipidemia    Left foot pain    Lumbar spondylosis    Obesity    Pneumonia    Pre-diabetes    Reflux    Right leg DVT (HCC)    Sleep apnea    no cpap   Stroke (HCC)    Tendonitis, Achilles, left     Past Surgical History:  Procedure Laterality Date   ABDOMINAL HYSTERECTOMY     ACHILLES TENDON SURGERY Left 12/15/2023   Procedure: REPAIR, TENDON, ACHILLES;  Surgeon: Pink Bridges, DPM;  Location: [facility] ORS;  Service: Orthopedics/Podiatry;  Laterality: Left;   CHOLECYSTECTOMY     COLONOSCOPY WITH PROPOFOL  N/A 06/16/2017   Procedure: COLONOSCOPY WITH PROPOFOL ;  Surgeon: Selena Daily, MD;  Location: [facility] ENDOSCOPY;  Service: Gastroenterology;  Laterality: N/A;   COLONOSCOPY WITH PROPOFOL  N/A 11/10/2020   Procedure: COLONOSCOPY WITH PROPOFOL ;  Surgeon: Selena Daily, MD;  Location: [facility] ENDOSCOPY;  Service: Gastroenterology;  Laterality: N/A;   GASTROC RECESSION EXTREMITY Left 12/15/2023   Procedure: RECESSION, TENDON,  GASTROCNEMIUS;  Surgeon: Pink Bridges, DPM;  Location: [facility] ORS;  Service: Orthopedics/Podiatry;  Laterality: Left;   HEEL SPUR EXCISION     HERNIA REPAIR     KELOID EXCISION     OSTECTOMY Left 12/15/2023   Procedure: OSTECTOMY;  Surgeon: Pink Bridges, DPM;  Location: [facility] ORS;  Service: Orthopedics/Podiatry;  Laterality: Left;   TENDON TRANSFER Left 12/15/2023   Procedure: TRANSFER, TENDON;  Surgeon: Pink Bridges, DPM;  Location: [facility] ORS;  Service: Orthopedics/Podiatry;  Laterality: Left;    Family History  Problem Relation Age of Onset   Breast cancer Sister    Heart disease Mother    Congestive Heart Failure Sister     Social History Social History   Tobacco Use   Smoking status: Never   Smokeless tobacco: Never  Vaping Use   Vaping status: Never Used  Substance Use Topics   Alcohol use: No    Alcohol/week: 0.0 standard drinks of alcohol   Drug use: No    Allergies  Allergen Reactions   Penicillins Hives and Rash    Has patient had a PCN reaction causing immediate rash, facial/tongue/throat swelling, SOB or lightheadedness with hypotension: Yes Has patient had a PCN reaction causing severe rash involving mucus membranes or skin necrosis: No Has patient had a PCN reaction that required hospitalization No Has patient had a PCN reaction occurring  within the last 10 years: No If all of the above answers are NO, then may proceed with Cephalosporin use.   Metronidazole Rash    Other reaction(s): Skin Rashes, Hives    Current Outpatient Medications  Medication Sig Dispense Refill   acetaminophen  (TYLENOL ) 500 MG tablet Take 1,000 mg by mouth every 6 (six) hours as needed.     albuterol  (VENTOLIN  HFA) 108 (90 Base) MCG/ACT inhaler INHALE 1 TO 2 PUFFS BY MOUTH EVERY 6 HOURS AS NEEDED FOR SHORTNESS OF BREATH OR WHEEZING (RESCUE INHALER) 8.5 g 1   aspirin  81 MG chewable tablet Chew 81 mg by mouth daily.     atorvastatin  (LIPITOR) 20 MG tablet TAKE 1 TABLET BY MOUTH EVERY  EVENING 90 tablet 3   cetirizine  (ZYRTEC ) 10 MG tablet TAKE 1 TABLET BY MOUTH NIGHTLY AT BEDTIME FOR SEASONAL ALLERGIES 30 tablet 3   clopidogrel  (PLAVIX ) 75 MG tablet Take 1 tablet (75 mg total) by mouth daily. 90 tablet 3   dapagliflozin propanediol (FARXIGA) 10 MG TABS tablet Take 1 tablet (10 mg total) by mouth daily before breakfast. 30 tablet 3   diclofenac Sodium (VOLTAREN) 1 % GEL APPLY 2 GRAMS TO BILATERAL KNEES EVERY 8HOURS AS NEEDED FOR ARTHRITIS 100 g 1   doxycycline  (VIBRA -TABS) 100 MG tablet Take 1 tablet (100 mg total) by mouth 2 (two) times daily. 14 tablet 0   escitalopram  (LEXAPRO ) 10 MG tablet TAKE 1 TABLET BY MOUTH DAILY 90 tablet 3   gabapentin (NEURONTIN) 300 MG capsule Take 900 mg by mouth 2 (two) times daily.     hydrOXYzine  (ATARAX /VISTARIL ) 50 MG tablet Take 50 mg by mouth at bedtime.     LATUDA 60 MG TABS Take 1 tablet by mouth at bedtime.     losartan  (COZAAR ) 100 MG tablet Take 1 tablet (100 mg total) by mouth daily. 90 tablet 3   montelukast  (SINGULAIR ) 10 MG tablet TAKE 1 TABLET BY MOUTH DAILY 90 tablet 1   naproxen  (NAPROSYN ) 500 MG tablet Take 1 tablet (500 mg total) by mouth 2 (two) times daily with a meal. 20 tablet 2   ondansetron  (ZOFRAN ) 4 MG tablet Take 1 tablet (4 mg total) by mouth every 8 (eight) hours as needed for nausea or vomiting. 20 tablet 0   oxybutynin  (DITROPAN  XL) 10 MG 24 hr tablet Take 1 tablet (10 mg total) by mouth daily. 30 tablet 2   pantoprazole  (PROTONIX ) 40 MG tablet TAKE 1 TABLET BY MOUTH DAILY 90 tablet 1   potassium chloride  SA (KLOR-CON  M) 20 MEQ tablet TAKE 1 TABLET BY MOUTH DAILY 90 tablet 1   REXULTI 2 MG TABS tablet Take 2 mg by mouth daily.     spironolactone (ALDACTONE) 25 MG tablet TAKE 1 TABLET BY MOUTH DAILY 90 tablet 0   tirzepatide  (ZEPBOUND ) 2.5 MG/0.5ML injection vial Inject 2.5 mg into the skin once a week. 2 mL 4   zolpidem  (AMBIEN  CR) 6.25 MG CR tablet TAKE 1 TABLET BY MOUTH AT BEDTIME AS NEEDED FOR INSOMNIA 30  tablet 0   No current facility-administered medications for this visit.     Review of Systems Full ROS  was asked and was negative except for the information on the HPI  Physical Exam Blood pressure 135/69, pulse 60, temperature 97.6 F (36.4 C), temperature source Oral, height 5' 1 (1.549 m), weight (!) 312 lb (141.5 kg), SpO2 94%. CONSTITUTIONAL: No acute distress BMI of 59. EYES: Pupils are equal, round,, Sclera are non-icteric. EARS, NOSE, MOUTH  AND THROAT: The oral mucosa is pink and moist. Hearing is intact to voice. LYMPH NODES:  Lymph nodes in the neck are normal. RESPIRATORY:  Lungs are clear. There is normal respiratory effort, with equal breath sounds bilaterally, and without pathologic use of accessory muscles. CARDIOVASCULAR: Heart is regular without murmurs, gallops, or rubs. GI: The abdomen is  soft, nontender, and nondistended. There are no palpable masses. There is no hepatosplenomegaly. There are normal bowel sounds in all quadrants. GU: Rectal deferred.   MUSCULOSKELETAL: She does have left boot (podiatry ) covering her left lower extremity  SKIN: There is evidence of a raised keloid measuring 3-1/2 cm on the lateral aspect of upper arm on the right side.  There are no alarming characteristics of cancer.  No evidence of infection . She Does have additional keloids on her right face NEUROLOGIC: Motor and sensation is grossly normal. Cranial nerves are grossly intact. PSYCH:  Oriented to person, place and time. Affect is normal.  Data Reviewed  I have personally reviewed the patient's imaging, laboratory findings and medical records.    Assessment/Plan 63 year old female with multiple medical comorbidities presents with a symptomatic keloid on her right upper arm.  I had an good discussion with the patient regarding her disease process.  She does have other keloids in other parts of her body in his skin that were related to prior scars.  Given this I anticipate that  if we excised the keloid she will reform again.  I had an extensive discussion with her about this realistic risk. After a good discussion with the patient the patient does not wish to have this excised.  I agree with her that this is probably not the best course of action  Please note that I spent 30 minutes in this encounter including personally reviewing medical records, personally reviewing imaging studies, coordinating her care, placing orders and performing documentation    Evelia Hipp, MD FACS General Surgeon 02/15/2024, 11:01 AM

## 2024-02-16 ENCOUNTER — Encounter: Payer: Self-pay | Admitting: Surgery

## 2024-02-22 ENCOUNTER — Telehealth: Payer: Self-pay | Admitting: Cardiology

## 2024-02-22 NOTE — Telephone Encounter (Signed)
 Patient left VM asking if her papers have been sent back to cab(?).  Do you know what she is talking about?

## 2024-03-06 DIAGNOSIS — G4733 Obstructive sleep apnea (adult) (pediatric): Secondary | ICD-10-CM | POA: Diagnosis not present

## 2024-03-07 ENCOUNTER — Other Ambulatory Visit: Payer: Self-pay | Admitting: Internal Medicine

## 2024-03-12 ENCOUNTER — Ambulatory Visit: Attending: Physician Assistant

## 2024-03-12 DIAGNOSIS — G4733 Obstructive sleep apnea (adult) (pediatric): Secondary | ICD-10-CM | POA: Diagnosis not present

## 2024-03-12 DIAGNOSIS — R0681 Apnea, not elsewhere classified: Secondary | ICD-10-CM | POA: Diagnosis present

## 2024-03-17 DIAGNOSIS — G4733 Obstructive sleep apnea (adult) (pediatric): Secondary | ICD-10-CM | POA: Diagnosis not present

## 2024-03-19 ENCOUNTER — Other Ambulatory Visit: Payer: Self-pay | Admitting: Cardiology

## 2024-03-20 ENCOUNTER — Ambulatory Visit (INDEPENDENT_AMBULATORY_CARE_PROVIDER_SITE_OTHER): Admitting: Cardiovascular Disease

## 2024-03-20 ENCOUNTER — Encounter: Payer: Self-pay | Admitting: Cardiovascular Disease

## 2024-03-20 VITALS — BP 102/78 | Ht 61.0 in | Wt 312.4 lb

## 2024-03-20 DIAGNOSIS — G459 Transient cerebral ischemic attack, unspecified: Secondary | ICD-10-CM

## 2024-03-20 DIAGNOSIS — I824Y1 Acute embolism and thrombosis of unspecified deep veins of right proximal lower extremity: Secondary | ICD-10-CM

## 2024-03-20 DIAGNOSIS — I5033 Acute on chronic diastolic (congestive) heart failure: Secondary | ICD-10-CM

## 2024-03-20 DIAGNOSIS — I10 Essential (primary) hypertension: Secondary | ICD-10-CM

## 2024-03-20 DIAGNOSIS — I2510 Atherosclerotic heart disease of native coronary artery without angina pectoris: Secondary | ICD-10-CM

## 2024-03-20 DIAGNOSIS — G4733 Obstructive sleep apnea (adult) (pediatric): Secondary | ICD-10-CM | POA: Diagnosis not present

## 2024-03-20 NOTE — Progress Notes (Signed)
 Cardiology Office Note   Date:  03/20/2024   ID:  ATIYA YERA, DOB 05/18/1961, MRN [id number]  PCP:  Carin Gauze, NP  Cardiologist:  Denyse Bathe, MD      History of Present Illness: Lindsay Cooke is a 63 y.o. female who presents for  Chief Complaint  Patient presents with   Follow-up    5 week follow up    Has SOB while walking but not at rest.      Past Medical History:  Diagnosis Date   Allergy    Anemia    Anxiety    Arthritis    Asthma    Benign essential hypertension    Calcaneal spur, left    Class 3 severe obesity with serious comorbidity and body mass index (BMI) of 60.0 to 69.9 in adult, unspecified obesity type    Depression    Dyspnea    Dysrhythmia    Equinus deformity of both feet    GERD (gastroesophageal reflux disease)    Hyperlipidemia    Left foot pain    Lumbar spondylosis    Obesity    Pneumonia    Pre-diabetes    Reflux    Right leg DVT (HCC)    Sleep apnea    no cpap   Stroke (HCC)    Tendonitis, Achilles, left      Past Surgical History:  Procedure Laterality Date   ABDOMINAL HYSTERECTOMY     ACHILLES TENDON SURGERY Left 12/15/2023   Procedure: REPAIR, TENDON, ACHILLES;  Surgeon: Lennie Barter, DPM;  Location: [facility];  Service: Orthopedics/Podiatry;  Laterality: Left;   CHOLECYSTECTOMY     COLONOSCOPY WITH PROPOFOL  N/A 06/16/2017   Procedure: COLONOSCOPY WITH PROPOFOL ;  Surgeon: Unk Corinn Skiff, MD;  Location: [facility];  Service: Gastroenterology;  Laterality: N/A;   COLONOSCOPY WITH PROPOFOL  N/A 11/10/2020   Procedure: COLONOSCOPY WITH PROPOFOL ;  Surgeon: Unk Corinn Skiff, MD;  Location: [facility] ENDOSCOPY;  Service: Gastroenterology;  Laterality: N/A;   GASTROC RECESSION EXTREMITY Left 12/15/2023   Procedure: RECESSION, TENDON, GASTROCNEMIUS;  Surgeon: Lennie Barter, DPM;  Location: [facility];  Service: Orthopedics/Podiatry;  Laterality: Left;   HEEL SPUR EXCISION     HERNIA REPAIR     KELOID EXCISION      OSTECTOMY Left 12/15/2023   Procedure: OSTECTOMY;  Surgeon: Lennie Barter, DPM;  Location: [facility];  Service: Orthopedics/Podiatry;  Laterality: Left;   TENDON TRANSFER Left 12/15/2023   Procedure: TRANSFER, TENDON;  Surgeon: Lennie Barter, DPM;  Location: [facility];  Service: Orthopedics/Podiatry;  Laterality: Left;     Current Outpatient Medications  Medication Sig Dispense Refill   acetaminophen  (TYLENOL ) 500 MG tablet Take 1,000 mg by mouth every 6 (six) hours as needed.     albuterol  (VENTOLIN  HFA) 108 (90 Base) MCG/ACT inhaler INHALE 1 TO 2 PUFFS BY MOUTH EVERY 6 HOURS AS NEEDED FOR SHORTNESS OF BREATH OR WHEEZING (RESCUE INHALER) 8.5 g 1   aspirin  81 MG chewable tablet Chew 81 mg by mouth daily.     atorvastatin  (LIPITOR) 20 MG tablet TAKE 1 TABLET BY MOUTH EVERY EVENING 90 tablet 3   cetirizine  (ZYRTEC ) 10 MG tablet TAKE 1 TABLET BY MOUTH NIGHTLY AT BEDTIME FOR SEASONAL ALLERGIES 30 tablet 3   clopidogrel  (PLAVIX ) 75 MG tablet Take 1 tablet (75 mg total) by mouth daily. 90 tablet 3   dapagliflozin  propanediol (FARXIGA ) 10 MG TABS tablet Take 1 tablet (10 mg total) by mouth daily before breakfast. 30 tablet 3  diclofenac Sodium (VOLTAREN) 1 % GEL APPLY 2 GRAMS TO BILATERAL KNEES EVERY 8HOURS AS NEEDED FOR ARTHRITIS 100 g 1   doxycycline  (VIBRA -TABS) 100 MG tablet Take 1 tablet (100 mg total) by mouth 2 (two) times daily. 14 tablet 0   escitalopram  (LEXAPRO ) 10 MG tablet TAKE 1 TABLET BY MOUTH DAILY 90 tablet 3   gabapentin (NEURONTIN) 300 MG capsule Take 900 mg by mouth 2 (two) times daily.     hydrOXYzine  (ATARAX /VISTARIL ) 50 MG tablet Take 50 mg by mouth at bedtime.     LATUDA 60 MG TABS Take 1 tablet by mouth at bedtime.     losartan  (COZAAR ) 100 MG tablet Take 1 tablet (100 mg total) by mouth daily. 90 tablet 3   montelukast  (SINGULAIR ) 10 MG tablet TAKE 1 TABLET BY MOUTH DAILY 90 tablet 1   naproxen  (NAPROSYN ) 500 MG tablet Take 1 tablet (500 mg total) by mouth 2 (two) times  daily with a meal. 20 tablet 2   ondansetron  (ZOFRAN ) 4 MG tablet Take 1 tablet (4 mg total) by mouth every 8 (eight) hours as needed for nausea or vomiting. 20 tablet 0   oxybutynin  (DITROPAN  XL) 10 MG 24 hr tablet Take 1 tablet (10 mg total) by mouth daily. 30 tablet 2   pantoprazole  (PROTONIX ) 40 MG tablet TAKE 1 TABLET BY MOUTH DAILY 90 tablet 1   potassium chloride  SA (KLOR-CON  M) 20 MEQ tablet TAKE 1 TABLET BY MOUTH DAILY 90 tablet 1   REXULTI 2 MG TABS tablet Take 2 mg by mouth daily.     spironolactone (ALDACTONE) 25 MG tablet TAKE 1 TABLET BY MOUTH DAILY 90 tablet 0   tirzepatide  (ZEPBOUND ) 2.5 MG/0.5ML injection vial Inject 2.5 mg into the skin once a week. 2 mL 4   zolpidem  (AMBIEN  CR) 6.25 MG CR tablet TAKE 1 TABLET BY MOUTH AT BEDTIME AS NEEDED FOR INSOMNIA 30 tablet 0   No current facility-administered medications for this visit.    Allergies:   Penicillins and Metronidazole    Social History:   reports that she has never smoked. She has never used smokeless tobacco. She reports that she does not drink alcohol and does not use drugs.   Family History:  family history includes Breast cancer in her sister; Congestive Heart Failure in her sister; Heart disease in her mother.    ROS:     Review of Systems  Constitutional: Negative.   HENT: Negative.    Eyes: Negative.   Respiratory: Negative.    Gastrointestinal: Negative.   Genitourinary: Negative.   Musculoskeletal: Negative.   Skin: Negative.   Neurological: Negative.   Endo/Heme/Allergies: Negative.   Psychiatric/Behavioral: Negative.    All other systems reviewed and are negative.     All other systems are reviewed and negative.    PHYSICAL EXAM: VS:  BP 102/78   Ht 5' 1 (1.549 m)   Wt (!) 312 lb 6.4 oz (141.7 kg)   SpO2 95%   BMI 59.03 kg/m  , BMI Body mass index is 59.03 kg/m. Last weight:  Wt Readings from Last 3 Encounters:  03/20/24 (!) 312 lb 6.4 oz (141.7 kg)  02/15/24 (!) 312 lb (141.5 kg)   02/14/24 (!) 312 lb (141.5 kg)     Physical Exam Constitutional:      Appearance: Normal appearance.  Cardiovascular:     Rate and Rhythm: Normal rate and regular rhythm.     Heart sounds: Normal heart sounds.  Pulmonary:  Effort: Pulmonary effort is normal.     Breath sounds: Normal breath sounds.  Musculoskeletal:     Right lower leg: No edema.     Left lower leg: No edema.  Neurological:     Mental Status: She is alert.       EKG:   Recent Labs: 12/12/2023: Hemoglobin 12.9; Platelets 224 02/03/2024: ALT 12; BUN 12; Creatinine, Ser 1.30; Potassium 4.1; Sodium 144; TSH 2.210    Lipid Panel    Component Value Date/Time   CHOL 146 02/03/2024 0924   TRIG 76 02/03/2024 0924   HDL 65 02/03/2024 0924   CHOLHDL 2.2 02/03/2024 0924   CHOLHDL 2.8 11/25/2015 0502   VLDL 10 11/25/2015 0502   LDLCALC 66 02/03/2024 0924      Other studies Reviewed: Additional studies/ records that were reviewed today include:  Review of the above records demonstrates:       No data to display            ASSESSMENT AND PLAN:    ICD-10-CM   1. CHF (congestive heart failure), NYHA class III, acute on chronic, diastolic (HCC)  I50.33    ECHO showed grade 1 diastolic dysfunction, on aldactone and  farxiga     2. Acute deep vein thrombosis (DVT) of proximal vein of right lower extremity (HCC)  I82.4Y1     3. Coronary artery disease involving native coronary artery of native heart without angina pectoris  I25.10     4. Primary hypertension  I10     5. TIA (transient ischemic attack)  G45.9     6. Obstructive sleep apnea syndrome  G47.33        Problem List Items Addressed This Visit       Cardiovascular and Mediastinum   Hypertension   Acute deep vein thrombosis (DVT) of proximal vein of right lower extremity (HCC)   TIA (transient ischemic attack)   Coronary artery disease     Respiratory   Sleep apnea   Other Visit Diagnoses       CHF (congestive heart failure),  NYHA class III, acute on chronic, diastolic (HCC)    -  Primary   ECHO showed grade 1 diastolic dysfunction, on aldactone and  farxiga           Disposition:   Return in about 3 months (around 06/20/2024).    Total time spent: 35 minutes  Signed,  Denyse Bathe, MD  03/20/2024 10:21 AM    [facility]

## 2024-04-05 DIAGNOSIS — G4700 Insomnia, unspecified: Secondary | ICD-10-CM | POA: Diagnosis not present

## 2024-04-05 DIAGNOSIS — G4733 Obstructive sleep apnea (adult) (pediatric): Secondary | ICD-10-CM | POA: Diagnosis not present

## 2024-04-05 DIAGNOSIS — G459 Transient cerebral ischemic attack, unspecified: Secondary | ICD-10-CM | POA: Diagnosis not present

## 2024-04-05 DIAGNOSIS — M25569 Pain in unspecified knee: Secondary | ICD-10-CM | POA: Diagnosis not present

## 2024-04-06 ENCOUNTER — Other Ambulatory Visit: Payer: Self-pay | Admitting: Student

## 2024-04-06 DIAGNOSIS — M25511 Pain in right shoulder: Secondary | ICD-10-CM | POA: Diagnosis not present

## 2024-04-06 DIAGNOSIS — M67911 Unspecified disorder of synovium and tendon, right shoulder: Secondary | ICD-10-CM

## 2024-04-06 DIAGNOSIS — S42144A Nondisplaced fracture of glenoid cavity of scapula, right shoulder, initial encounter for closed fracture: Secondary | ICD-10-CM | POA: Diagnosis not present

## 2024-04-09 ENCOUNTER — Other Ambulatory Visit: Payer: Self-pay

## 2024-04-12 ENCOUNTER — Ambulatory Visit
Admission: RE | Admit: 2024-04-12 | Discharge: 2024-04-12 | Disposition: A | Discharge status: Home / Self Care | Source: Ambulatory Visit | Attending: Student | Admitting: Student

## 2024-04-12 DIAGNOSIS — M67911 Unspecified disorder of synovium and tendon, right shoulder: Secondary | ICD-10-CM | POA: Insufficient documentation

## 2024-04-12 DIAGNOSIS — M7581 Other shoulder lesions, right shoulder: Secondary | ICD-10-CM | POA: Diagnosis not present

## 2024-04-12 DIAGNOSIS — G4733 Obstructive sleep apnea (adult) (pediatric): Secondary | ICD-10-CM | POA: Diagnosis not present

## 2024-04-12 DIAGNOSIS — M25511 Pain in right shoulder: Secondary | ICD-10-CM | POA: Diagnosis not present

## 2024-04-12 DIAGNOSIS — S42144A Nondisplaced fracture of glenoid cavity of scapula, right shoulder, initial encounter for closed fracture: Secondary | ICD-10-CM | POA: Diagnosis not present

## 2024-04-12 DIAGNOSIS — M75111 Incomplete rotator cuff tear or rupture of right shoulder, not specified as traumatic: Secondary | ICD-10-CM | POA: Diagnosis not present

## 2024-04-12 DIAGNOSIS — M19011 Primary osteoarthritis, right shoulder: Secondary | ICD-10-CM | POA: Diagnosis not present

## 2024-04-19 ENCOUNTER — Other Ambulatory Visit: Payer: Self-pay | Admitting: Internal Medicine

## 2024-04-19 DIAGNOSIS — F5101 Primary insomnia: Secondary | ICD-10-CM

## 2024-04-19 DIAGNOSIS — E785 Hyperlipidemia, unspecified: Secondary | ICD-10-CM | POA: Diagnosis not present

## 2024-04-19 DIAGNOSIS — I10 Essential (primary) hypertension: Secondary | ICD-10-CM | POA: Diagnosis not present

## 2024-04-19 DIAGNOSIS — I519 Heart disease, unspecified: Secondary | ICD-10-CM | POA: Diagnosis not present

## 2024-04-19 DIAGNOSIS — N1831 Chronic kidney disease, stage 3a: Secondary | ICD-10-CM | POA: Diagnosis not present

## 2024-04-19 DIAGNOSIS — R8290 Unspecified abnormal findings in urine: Secondary | ICD-10-CM | POA: Diagnosis not present

## 2024-04-20 ENCOUNTER — Other Ambulatory Visit: Payer: Self-pay | Admitting: Nephrology

## 2024-04-20 DIAGNOSIS — R8290 Unspecified abnormal findings in urine: Secondary | ICD-10-CM

## 2024-04-20 DIAGNOSIS — N1831 Chronic kidney disease, stage 3a: Secondary | ICD-10-CM

## 2024-04-20 DIAGNOSIS — M67911 Unspecified disorder of synovium and tendon, right shoulder: Secondary | ICD-10-CM | POA: Diagnosis not present

## 2024-04-20 DIAGNOSIS — M25511 Pain in right shoulder: Secondary | ICD-10-CM | POA: Diagnosis not present

## 2024-04-23 DIAGNOSIS — M216X2 Other acquired deformities of left foot: Secondary | ICD-10-CM | POA: Diagnosis not present

## 2024-04-23 DIAGNOSIS — M7732 Calcaneal spur, left foot: Secondary | ICD-10-CM | POA: Diagnosis not present

## 2024-04-23 DIAGNOSIS — Z86718 Personal history of other venous thrombosis and embolism: Secondary | ICD-10-CM | POA: Diagnosis not present

## 2024-04-23 DIAGNOSIS — M79672 Pain in left foot: Secondary | ICD-10-CM | POA: Diagnosis not present

## 2024-04-23 DIAGNOSIS — M216X1 Other acquired deformities of right foot: Secondary | ICD-10-CM | POA: Diagnosis not present

## 2024-04-23 DIAGNOSIS — M7662 Achilles tendinitis, left leg: Secondary | ICD-10-CM | POA: Diagnosis not present

## 2024-04-27 ENCOUNTER — Other Ambulatory Visit: Payer: Self-pay | Admitting: Cardiology

## 2024-04-27 ENCOUNTER — Other Ambulatory Visit: Payer: Self-pay | Admitting: Cardiovascular Disease

## 2024-05-01 ENCOUNTER — Telehealth: Payer: Self-pay

## 2024-05-01 NOTE — Telephone Encounter (Signed)
Pt is requesting heartburn medication

## 2024-05-01 NOTE — Telephone Encounter (Signed)
 Patient called asking for rx/ referral for a shower chair to be sent for her pls advise

## 2024-05-02 ENCOUNTER — Other Ambulatory Visit: Payer: Self-pay | Admitting: Cardiology

## 2024-05-02 MED ORDER — FAMOTIDINE 20 MG PO TABS: 20.0000 mg | ORAL_TABLET | Freq: Two times a day (BID) | ORAL | 1 refills | Status: DC

## 2024-05-02 NOTE — Telephone Encounter (Signed)
 Patient is taking the pantoprazole  one tab daily. Please advise on what else she can do/take.

## 2024-05-03 ENCOUNTER — Other Ambulatory Visit: Payer: Self-pay | Admitting: Cardiology

## 2024-05-03 DIAGNOSIS — G459 Transient cerebral ischemic attack, unspecified: Secondary | ICD-10-CM

## 2024-05-03 DIAGNOSIS — Z7409 Other reduced mobility: Secondary | ICD-10-CM

## 2024-05-03 DIAGNOSIS — I639 Cerebral infarction, unspecified: Secondary | ICD-10-CM

## 2024-05-03 DIAGNOSIS — I824Y1 Acute embolism and thrombosis of unspecified deep veins of right proximal lower extremity: Secondary | ICD-10-CM

## 2024-05-03 NOTE — Telephone Encounter (Signed)
 Called patient about her most recent lab results. After the doctor reviewed the labs, he said  Kidney function is consistent with chronic kidney disease stage IIIA. GFR of 46. No changes are needed at this time. Patient expressed that she understood the information given.

## 2024-05-04 ENCOUNTER — Ambulatory Visit
Admission: RE | Admit: 2024-05-04 | Discharge: 2024-05-04 | Disposition: A | Discharge status: Home / Self Care | Source: Ambulatory Visit | Attending: Nephrology | Admitting: Nephrology

## 2024-05-04 DIAGNOSIS — N189 Chronic kidney disease, unspecified: Secondary | ICD-10-CM | POA: Diagnosis not present

## 2024-05-04 DIAGNOSIS — R8290 Unspecified abnormal findings in urine: Secondary | ICD-10-CM | POA: Insufficient documentation

## 2024-05-04 DIAGNOSIS — N1831 Chronic kidney disease, stage 3a: Secondary | ICD-10-CM | POA: Diagnosis not present

## 2024-05-08 ENCOUNTER — Other Ambulatory Visit: Payer: Self-pay | Admitting: Cardiology

## 2024-05-08 ENCOUNTER — Other Ambulatory Visit: Payer: Self-pay

## 2024-05-08 DIAGNOSIS — Z7409 Other reduced mobility: Secondary | ICD-10-CM

## 2024-05-08 DIAGNOSIS — G459 Transient cerebral ischemic attack, unspecified: Secondary | ICD-10-CM

## 2024-05-14 NOTE — Telephone Encounter (Signed)
 Printed for signature and will get faxed

## 2024-05-16 ENCOUNTER — Other Ambulatory Visit: Payer: Self-pay | Admitting: Internal Medicine

## 2024-05-16 DIAGNOSIS — G8929 Other chronic pain: Secondary | ICD-10-CM | POA: Diagnosis not present

## 2024-05-16 DIAGNOSIS — M25511 Pain in right shoulder: Secondary | ICD-10-CM | POA: Diagnosis not present

## 2024-05-16 DIAGNOSIS — F5101 Primary insomnia: Secondary | ICD-10-CM

## 2024-05-17 ENCOUNTER — Telehealth: Payer: Self-pay

## 2024-05-17 DIAGNOSIS — E876 Hypokalemia: Secondary | ICD-10-CM | POA: Diagnosis not present

## 2024-05-17 DIAGNOSIS — N1831 Chronic kidney disease, stage 3a: Secondary | ICD-10-CM | POA: Diagnosis not present

## 2024-05-17 DIAGNOSIS — E785 Hyperlipidemia, unspecified: Secondary | ICD-10-CM | POA: Diagnosis not present

## 2024-05-17 DIAGNOSIS — I10 Essential (primary) hypertension: Secondary | ICD-10-CM | POA: Diagnosis not present

## 2024-05-17 DIAGNOSIS — I519 Heart disease, unspecified: Secondary | ICD-10-CM | POA: Diagnosis not present

## 2024-05-17 NOTE — Telephone Encounter (Signed)
 Pt LM asking for call back but did not state what she needed

## 2024-05-22 ENCOUNTER — Encounter: Payer: Self-pay | Admitting: Cardiology

## 2024-05-22 ENCOUNTER — Telehealth: Payer: Self-pay

## 2024-05-22 ENCOUNTER — Ambulatory Visit (INDEPENDENT_AMBULATORY_CARE_PROVIDER_SITE_OTHER): Admitting: Cardiology

## 2024-05-22 VITALS — BP 106/72 | HR 68 | Ht 61.0 in | Wt 298.0 lb

## 2024-05-22 DIAGNOSIS — R7303 Prediabetes: Secondary | ICD-10-CM

## 2024-05-22 DIAGNOSIS — N1831 Chronic kidney disease, stage 3a: Secondary | ICD-10-CM

## 2024-05-22 DIAGNOSIS — Z1329 Encounter for screening for other suspected endocrine disorder: Secondary | ICD-10-CM

## 2024-05-22 DIAGNOSIS — R233 Spontaneous ecchymoses: Secondary | ICD-10-CM | POA: Diagnosis not present

## 2024-05-22 DIAGNOSIS — E782 Mixed hyperlipidemia: Secondary | ICD-10-CM

## 2024-05-22 DIAGNOSIS — I10 Essential (primary) hypertension: Secondary | ICD-10-CM

## 2024-05-22 MED ORDER — ZEPBOUND 5 MG/0.5ML ~~LOC~~ SOAJ: 5.0000 mg | SUBCUTANEOUS | 4 refills | Status: DC

## 2024-05-22 NOTE — Telephone Encounter (Signed)
 Pharmacy called to let you know that Dr Lavinia called asking for the potassium to be D.C so they are d/cing it just wanted to let you know .

## 2024-05-22 NOTE — Progress Notes (Signed)
 Established Patient Office Visit  Subjective:  Patient ID: Lindsay Cooke, female    DOB: 05/06/1961  Age: 63 y.o. MRN: [id number]  Chief Complaint  Patient presents with   Follow-up    Wants to make sure I'm not getting anymore blood clots.    Patient in office wanting to verify she doesn't have blood clots. Patient complaining of bruises on her right lower leg, unsure how they happened. CBC in 04/2024 was normal. Will recheck today along with iron. Patient on clopidogrel  and aspirin .  Patient due for fasting lab work, will get done today and discuss at next visit. Continue current medications.    No other concerns at this time.   Past Medical History:  Diagnosis Date   Allergy    Anemia    Anxiety    Arthritis    Asthma    Benign essential hypertension    Calcaneal spur, left    Class 3 severe obesity with serious comorbidity and body mass index (BMI) of 60.0 to 69.9 in adult, unspecified obesity type    Depression    Dyspnea    Dysrhythmia    Equinus deformity of both feet    GERD (gastroesophageal reflux disease)    Hyperlipidemia    Left foot pain    Lumbar spondylosis    Obesity    Pneumonia    Pre-diabetes    Reflux    Right leg DVT (HCC)    Sleep apnea    no cpap   Stroke (HCC)    Tendonitis, Achilles, left     Past Surgical History:  Procedure Laterality Date   ABDOMINAL HYSTERECTOMY     ACHILLES TENDON SURGERY Left 12/15/2023   Procedure: REPAIR, TENDON, ACHILLES;  Surgeon: Lennie Barter, DPM;  Location: [facility];  Service: Orthopedics/Podiatry;  Laterality: Left;   CHOLECYSTECTOMY     COLONOSCOPY WITH PROPOFOL  N/A 06/16/2017   Procedure: COLONOSCOPY WITH PROPOFOL ;  Surgeon: Unk Corinn Skiff, MD;  Location: [facility] ENDOSCOPY;  Service: Gastroenterology;  Laterality: N/A;   COLONOSCOPY WITH PROPOFOL  N/A 11/10/2020   Procedure: COLONOSCOPY WITH PROPOFOL ;  Surgeon: Unk Corinn Skiff, MD;  Location: [facility] ENDOSCOPY;  Service: Gastroenterology;   Laterality: N/A;   GASTROC RECESSION EXTREMITY Left 12/15/2023   Procedure: RECESSION, TENDON, GASTROCNEMIUS;  Surgeon: Lennie Barter, DPM;  Location: [facility];  Service: Orthopedics/Podiatry;  Laterality: Left;   HEEL SPUR EXCISION     HERNIA REPAIR     KELOID EXCISION     OSTECTOMY Left 12/15/2023   Procedure: OSTECTOMY;  Surgeon: Lennie Barter, DPM;  Location: [facility];  Service: Orthopedics/Podiatry;  Laterality: Left;   TENDON TRANSFER Left 12/15/2023   Procedure: TRANSFER, TENDON;  Surgeon: Lennie Barter, DPM;  Location: [facility];  Service: Orthopedics/Podiatry;  Laterality: Left;    Social History   Socioeconomic History   Marital status: Single    Spouse name: Not on file   Number of children: Not on file   Years of education: Not on file   Highest education level: Not on file  Occupational History   Not on file  Tobacco Use   Smoking status: Never   Smokeless tobacco: Never  Vaping Use   Vaping status: Never Used  Substance and Sexual Activity   Alcohol use: No    Alcohol/week: 0.0 standard drinks of alcohol   Drug use: No   Sexual activity: Never  Other Topics Concern   Not on file  Social History Narrative   Lives with daughter   Social Drivers  of Health   Financial Resource Strain: Medium Risk (05/18/2024)   Received from [facility]   Overall Financial Resource Strain (CARDIA)    Difficulty of Paying Living Expenses: Somewhat hard  Food Insecurity: Unknown (05/18/2024)   [facility]   Hunger Vital Sign    Within the past 12 months, you worried that your food would run out before you got the money to buy more.: Never true    Within the past 12 months, the food you bought just didn't last and you didn't have money to get more.: Patient declined  Recent Concern: Food Insecurity - Food Insecurity Present (04/06/2024)   Received from [facility]   Hunger Vital Sign    Within the past 12 months, you  worried that your food would run out before you got the money to buy more.: Often true    Within the past 12 months, the food you bought just didn't last and you didn't have money to get more.: Often true  Transportation Needs: No Transportation Needs (05/18/2024)   Received from [facility] - Transportation    In the past 12 months, has lack of transportation kept you from medical appointments or from getting medications?: No    Lack of Transportation (Non-Medical): No  Physical Activity: Not on file  Stress: Not on file  Social Connections: Not on file  Intimate Partner Violence: Not on file    Family History  Problem Relation Age of Onset   Breast cancer Sister    Heart disease Mother    Congestive Heart Failure Sister     Allergies  Allergen Reactions   Penicillins Hives and Rash    Has patient had a PCN reaction causing immediate rash, facial/tongue/throat swelling, SOB or lightheadedness with hypotension: Yes Has patient had a PCN reaction causing severe rash involving mucus membranes or skin necrosis: No Has patient had a PCN reaction that required hospitalization No Has patient had a PCN reaction occurring within the last 10 years: No If all of the above answers are NO, then may proceed with Cephalosporin use.   Metronidazole Rash    Other reaction(s): Skin Rashes, Hives    Outpatient Medications Prior to Visit  Medication Sig   acetaminophen  (TYLENOL ) 500 MG tablet Take 1,000 mg by mouth every 6 (six) hours as needed.   albuterol  (VENTOLIN  HFA) 108 (90 Base) MCG/ACT inhaler INHALE 1 TO 2 PUFFS BY MOUTH EVERY 6 HOURS AS NEEDED FOR SHORTNESS OF BREATH OR WHEEZING (RESCUE INHALER)   aspirin  81 MG chewable tablet Chew 81 mg by mouth daily.   atorvastatin  (LIPITOR) 20 MG tablet TAKE 1 TABLET BY MOUTH EVERY EVENING   cetirizine  (ZYRTEC ) 10 MG tablet TAKE 1 TABLET BY MOUTH NIGHTLY AT BEDTIME FOR SEASONAL ALLERGIES   clopidogrel  (PLAVIX ) 75 MG  tablet Take 1 tablet (75 mg total) by mouth daily.   dapagliflozin  propanediol (FARXIGA ) 10 MG TABS tablet Take 1 tablet (10 mg total) by mouth daily before breakfast.   diclofenac Sodium (VOLTAREN) 1 % GEL APPLY 2 GRAMS TO BILATERAL KNEES EVERY 8HOURS AS NEEDED FOR ARTHRITIS   doxycycline  (VIBRA -TABS) 100 MG tablet Take 1 tablet (100 mg total) by mouth 2 (two) times daily.   escitalopram  (LEXAPRO ) 10 MG tablet TAKE 1 TABLET BY MOUTH DAILY   famotidine  (PEPCID ) 20 MG tablet Take 1 tablet (20 mg total) by mouth 2 (two) times daily.   gabapentin (NEURONTIN) 300 MG capsule  Take 900 mg by mouth 2 (two) times daily.   hydrOXYzine  (ATARAX /VISTARIL ) 50 MG tablet Take 50 mg by mouth at bedtime.   LATUDA 60 MG TABS Take 1 tablet by mouth at bedtime.   losartan  (COZAAR ) 100 MG tablet Take 1 tablet (100 mg total) by mouth daily.   montelukast  (SINGULAIR ) 10 MG tablet TAKE 1 TABLET BY MOUTH DAILY   naproxen  (NAPROSYN ) 500 MG tablet Take 1 tablet (500 mg total) by mouth 2 (two) times daily with a meal.   oxybutynin  (DITROPAN -XL) 10 MG 24 hr tablet TAKE 1 TABLET BY MOUTH DAILY   pantoprazole  (PROTONIX ) 40 MG tablet TAKE 1 TABLET BY MOUTH DAILY   REXULTI 2 MG TABS tablet Take 2 mg by mouth daily.   spironolactone (ALDACTONE) 25 MG tablet TAKE 1 TABLET BY MOUTH DAILY   zolpidem  (AMBIEN  CR) 6.25 MG CR tablet TAKE 1 TABLET BY MOUTH AT BEDTIME AS NEEDED FOR INSOMNIA   [DISCONTINUED] tirzepatide  (ZEPBOUND ) 2.5 MG/0.5ML injection vial Inject 2.5 mg into the skin once a week.   ondansetron  (ZOFRAN ) 4 MG tablet Take 1 tablet (4 mg total) by mouth every 8 (eight) hours as needed for nausea or vomiting. (Patient not taking: Reported on 05/22/2024)   No facility-administered medications prior to visit.    Review of Systems  Constitutional: Negative.   HENT: Negative.    Eyes: Negative.   Respiratory: Negative.  Negative for shortness of breath.   Cardiovascular: Negative.  Negative for chest pain.   Gastrointestinal: Negative.  Negative for abdominal pain, constipation and diarrhea.  Genitourinary: Negative.   Musculoskeletal:  Negative for joint pain and myalgias.  Skin: Negative.   Neurological: Negative.  Negative for dizziness and headaches.  Endo/Heme/Allergies:  Bruises/bleeds easily.  All other systems reviewed and are negative.      Objective:   BP 106/72   Pulse 68   Ht 5' 1 (1.549 m)   Wt 298 lb (135.2 kg)   BMI 56.31 kg/m   Vitals:   05/22/24 1034  BP: 106/72  Pulse: 68  Height: 5' 1 (1.549 m)  Weight: 298 lb (135.2 kg)  BMI (Calculated): 56.34    Physical Exam Vitals and nursing note reviewed.  Constitutional:      Appearance: Normal appearance. She is normal weight.  HENT:     Head: Normocephalic and atraumatic.     Nose: Nose normal.     Mouth/Throat:     Mouth: Mucous membranes are moist.  Eyes:     Extraocular Movements: Extraocular movements intact.     Conjunctiva/sclera: Conjunctivae normal.     Pupils: Pupils are equal, round, and reactive to light.  Cardiovascular:     Rate and Rhythm: Normal rate and regular rhythm.     Pulses: Normal pulses.     Heart sounds: Normal heart sounds.  Pulmonary:     Effort: Pulmonary effort is normal.     Breath sounds: Normal breath sounds.  Abdominal:     General: Abdomen is flat. Bowel sounds are normal.     Palpations: Abdomen is soft.  Musculoskeletal:        General: Normal range of motion.     Cervical back: Normal range of motion.  Skin:    General: Skin is warm and dry.     Findings: Bruising present.  Neurological:     General: No focal deficit present.     Mental Status: She is alert and oriented to person, place, and time.  Psychiatric:  Mood and Affect: Mood normal.        Behavior: Behavior normal.        Thought Content: Thought content normal.        Judgment: Judgment normal.      No results found for any visits on 05/22/24.  No results found for this or any  previous visit (from the past 2160 hours).    Assessment & Plan:  Lab work today Continue current medications  Problem List Items Addressed This Visit       Cardiovascular and Mediastinum   Hypertension - Primary   Relevant Orders   CMP14+EGFR     Genitourinary   Stage 3a chronic kidney disease (HCC)   Relevant Orders   CMP14+EGFR     Other   Hyperlipidemia   Relevant Orders   Lipid Profile   Pre-diabetes   Relevant Orders   Hemoglobin A1c   Other Visit Diagnoses       Easy bruising       Relevant Orders   CBC with Differential/Platelet   Iron, TIBC and Ferritin Panel     Thyroid  disorder screening       Relevant Orders   TSH       Return if symptoms worsen or fail to improve, for as scheduled.   Total time spent: 25 minutes  Google, NP  05/22/2024   This document may have been prepared by Dragon Voice Recognition software and as such may include unintentional dictation errors.

## 2024-05-23 ENCOUNTER — Ambulatory Visit: Payer: Self-pay | Admitting: Cardiology

## 2024-05-23 LAB — CMP14+EGFR
ALT: 12 IU/L (ref 0–32)
AST: 17 IU/L (ref 0–40)
Albumin: 4.4 g/dL (ref 3.8–4.9)
Alkaline Phosphatase: 53 IU/L (ref 49–135)
BUN/Creatinine Ratio: 11 — ABNORMAL LOW (ref 12–28)
BUN: 16 mg/dL (ref 8–27)
Bilirubin Total: 0.8 mg/dL (ref 0.0–1.2)
CO2: 23 mmol/L (ref 20–29)
Calcium: 9.7 mg/dL (ref 8.7–10.3)
Chloride: 104 mmol/L (ref 96–106)
Creatinine, Ser: 1.48 mg/dL — ABNORMAL HIGH (ref 0.57–1.00)
Globulin, Total: 2.2 g/dL (ref 1.5–4.5)
Glucose: 139 mg/dL — ABNORMAL HIGH (ref 70–99)
Potassium: 4.4 mmol/L (ref 3.5–5.2)
Sodium: 142 mmol/L (ref 134–144)
Total Protein: 6.6 g/dL (ref 6.0–8.5)
eGFR: 40 mL/min/1.73 — ABNORMAL LOW (ref >59)

## 2024-05-23 LAB — IRON,TIBC AND FERRITIN PANEL
Ferritin: 119 ng/mL (ref 15–150)
Iron Saturation: 25 % (ref 15–55)
Iron: 69 ug/dL (ref 27–159)
Total Iron Binding Capacity: 271 ug/dL (ref 250–450)
UIBC: 202 ug/dL (ref 131–425)

## 2024-05-23 LAB — TSH: TSH: 1.99 u[IU]/mL (ref 0.450–4.500)

## 2024-05-23 LAB — CBC WITH DIFFERENTIAL/PLATELET
Basophils Absolute: 0.1 x10E3/uL (ref 0.0–0.2)
Basos: 1 %
EOS (ABSOLUTE): 0.3 x10E3/uL (ref 0.0–0.4)
Eos: 7 %
Hematocrit: 40 % (ref 34.0–46.6)
Hemoglobin: 12.8 g/dL (ref 11.1–15.9)
Immature Grans (Abs): 0 x10E3/uL (ref 0.0–0.1)
Immature Granulocytes: 0 %
Lymphocytes Absolute: 2.1 x10E3/uL (ref 0.7–3.1)
Lymphs: 45 %
MCH: 28.1 pg (ref 26.6–33.0)
MCHC: 32 g/dL (ref 31.5–35.7)
MCV: 88 fL (ref 79–97)
Monocytes Absolute: 0.4 x10E3/uL (ref 0.1–0.9)
Monocytes: 8 %
Neutrophils Absolute: 1.8 x10E3/uL (ref 1.4–7.0)
Neutrophils: 39 %
Platelets: 230 x10E3/uL (ref 150–450)
RBC: 4.55 x10E6/uL (ref 3.77–5.28)
RDW: 15.8 % — ABNORMAL HIGH (ref 11.7–15.4)
WBC: 4.7 x10E3/uL (ref 3.4–10.8)

## 2024-05-23 LAB — LIPID PANEL
Chol/HDL Ratio: 1.9 ratio (ref 0.0–4.4)
Cholesterol, Total: 152 mg/dL (ref 100–199)
HDL: 80 mg/dL (ref >39)
LDL Chol Calc (NIH): 58 mg/dL (ref 0–99)
Triglycerides: 70 mg/dL (ref 0–149)
VLDL Cholesterol Cal: 14 mg/dL (ref 5–40)

## 2024-05-23 LAB — HEMOGLOBIN A1C
Est. average glucose Bld gHb Est-mCnc: 114 mg/dL
Hgb A1c MFr Bld: 5.6 % (ref 4.8–5.6)

## 2024-05-24 DIAGNOSIS — E119 Type 2 diabetes mellitus without complications: Secondary | ICD-10-CM | POA: Diagnosis not present

## 2024-05-24 DIAGNOSIS — H26493 Other secondary cataract, bilateral: Secondary | ICD-10-CM | POA: Diagnosis not present

## 2024-05-24 DIAGNOSIS — H40013 Open angle with borderline findings, low risk, bilateral: Secondary | ICD-10-CM | POA: Diagnosis not present

## 2024-05-24 DIAGNOSIS — I10 Essential (primary) hypertension: Secondary | ICD-10-CM | POA: Diagnosis not present

## 2024-05-24 DIAGNOSIS — H04123 Dry eye syndrome of bilateral lacrimal glands: Secondary | ICD-10-CM | POA: Diagnosis not present

## 2024-05-25 ENCOUNTER — Other Ambulatory Visit: Payer: Self-pay | Admitting: Cardiology

## 2024-05-29 ENCOUNTER — Other Ambulatory Visit: Payer: Self-pay | Admitting: Internal Medicine

## 2024-05-29 DIAGNOSIS — F5101 Primary insomnia: Secondary | ICD-10-CM

## 2024-06-04 DIAGNOSIS — G8929 Other chronic pain: Secondary | ICD-10-CM | POA: Diagnosis not present

## 2024-06-04 DIAGNOSIS — M25511 Pain in right shoulder: Secondary | ICD-10-CM | POA: Diagnosis not present

## 2024-06-07 ENCOUNTER — Encounter: Payer: Self-pay | Admitting: Cardiology

## 2024-06-07 ENCOUNTER — Ambulatory Visit (INDEPENDENT_AMBULATORY_CARE_PROVIDER_SITE_OTHER): Admitting: Cardiology

## 2024-06-07 VITALS — BP 116/70 | HR 76 | Ht 61.0 in | Wt 300.0 lb

## 2024-06-07 DIAGNOSIS — Z23 Encounter for immunization: Secondary | ICD-10-CM | POA: Diagnosis not present

## 2024-06-07 DIAGNOSIS — I639 Cerebral infarction, unspecified: Secondary | ICD-10-CM

## 2024-06-07 DIAGNOSIS — M25562 Pain in left knee: Secondary | ICD-10-CM

## 2024-06-07 DIAGNOSIS — G8929 Other chronic pain: Secondary | ICD-10-CM | POA: Diagnosis not present

## 2024-06-07 DIAGNOSIS — I10 Essential (primary) hypertension: Secondary | ICD-10-CM

## 2024-06-07 DIAGNOSIS — E785 Hyperlipidemia, unspecified: Secondary | ICD-10-CM | POA: Diagnosis not present

## 2024-06-07 DIAGNOSIS — M25561 Pain in right knee: Secondary | ICD-10-CM

## 2024-06-07 MED ORDER — ZEPBOUND 7.5 MG/0.5ML ~~LOC~~ SOAJ: 7.5000 mg | SUBCUTANEOUS | 5 refills | Status: DC

## 2024-06-07 NOTE — Progress Notes (Signed)
 Established Patient Office Visit  Subjective:  Patient ID: Lindsay Cooke, female    DOB: 12/05/1960  Age: 63 y.o. MRN: [id number]  Chief Complaint  Patient presents with   Follow-up    4 Months Follow Up    Patient in office for 4 month follow up, discuss recent lab work. Patient complains of easy bruising. CBC and iron studies unremarkable. Patient is on aspirin  and Plavix .  Discussed lab work, Hgb A1c stable. LDL improved. Normal liver enzymes. Kidney function decreased, follows with nephrology.  Patient taking and tolerating Zepbound . Will increase to 7.5 mg weekly. Patient requesting a flu vaccine today.     No other concerns at this time.   Past Medical History:  Diagnosis Date   Allergy    Anemia    Anxiety    Arthritis    Asthma    Benign essential hypertension    Calcaneal spur, left    Class 3 severe obesity with serious comorbidity and body mass index (BMI) of 60.0 to 69.9 in adult, unspecified obesity type (HCC)    Depression    Dyspnea    Dysrhythmia    Equinus deformity of both feet    GERD (gastroesophageal reflux disease)    Hyperlipidemia    Left foot pain    Lumbar spondylosis    Obesity    Pneumonia    Pre-diabetes    Reflux    Right leg DVT (HCC)    Sleep apnea    no cpap   Stroke (HCC)    Tendonitis, Achilles, left     Past Surgical History:  Procedure Laterality Date   ABDOMINAL HYSTERECTOMY     ACHILLES TENDON SURGERY Left 12/15/2023   Procedure: REPAIR, TENDON, ACHILLES;  Surgeon: Lennie Barter, DPM;  Location: [facility] ORS;  Service: Orthopedics/Podiatry;  Laterality: Left;   CHOLECYSTECTOMY     COLONOSCOPY WITH PROPOFOL  N/A 06/16/2017   Procedure: COLONOSCOPY WITH PROPOFOL ;  Surgeon: Unk Corinn Skiff, MD;  Location: [facility] ENDOSCOPY;  Service: Gastroenterology;  Laterality: N/A;   COLONOSCOPY WITH PROPOFOL  N/A 11/10/2020   Procedure: COLONOSCOPY WITH PROPOFOL ;  Surgeon: Unk Corinn Skiff, MD;  Location: [facility] ENDOSCOPY;  Service:  Gastroenterology;  Laterality: N/A;   GASTROC RECESSION EXTREMITY Left 12/15/2023   Procedure: RECESSION, TENDON, GASTROCNEMIUS;  Surgeon: Lennie Barter, DPM;  Location: [facility] ORS;  Service: Orthopedics/Podiatry;  Laterality: Left;   HEEL SPUR EXCISION     HERNIA REPAIR     KELOID EXCISION     OSTECTOMY Left 12/15/2023   Procedure: OSTECTOMY;  Surgeon: Lennie Barter, DPM;  Location: [facility] OR[facility];  Service: Orthopedics/Podiatry;  Laterality: Left;   TENDON TRANSFER Left 12/15/2023   Procedure: TRANSFER, TENDON;  Surgeon: Lennie Barter, DPM;  Location: [facility] ORS;  Service: Orthopedics/Podiatry;  Laterality: Left;    Social History   Socioeconomic History   Marital status: Single    Spouse name: Not on file   Number of children: Not on file   Years of education: Not on file   Highest education level: Not on file  Occupational History   Not on file  Tobacco Use   Smoking status: Never   Smokeless tobacco: Never  Vaping Use   Vaping status: Never Used  Substance and Sexual Activity   Alcohol use: No    Alcohol/week: 0.0 standard drinks of alcohol   Drug use: No   Sexual activity: Never  Other Topics Concern   Not on file  Social History Narrative   Lives with daughter   Social  Drivers of Health   Financial Resource Strain: Medium Risk (05/18/2024)   Received from [facility]   Overall Financial Resource Strain (CARDIA)    Difficulty of Paying Living Expenses: Somewhat hard  Food Insecurity: Unknown (05/18/2024)   Received from [facility]   Hunger Vital Sign    Within the past 12 months, you worried that your food would run out before you got the money to buy more.: Never true    Within the past 12 months, the food you bought just didn't last and you didn't have money to get more.: Patient declined  Recent Concern: Food Insecurity - Food Insecurity Present (04/06/2024)   Received from [facility]   Hunger Vital Sign    Within the  past 12 months, you worried that your food would run out before you got the money to buy more.: Often true    Within the past 12 months, the food you bought just didn't last and you didn't have money to get more.: Often true  Transportation Needs: No Transportation Needs (05/18/2024)   Received from [facility] - Transportation    In the past 12 months, has lack of transportation kept you from medical appointments or from getting medications?: No    Lack of Transportation (Non-Medical): No  Physical Activity: Not on file  Stress: Not on file  Social Connections: Not on file  Intimate Partner Violence: Not on file    Family History  Problem Relation Age of Onset   Breast cancer Sister    Heart disease Mother    Congestive Heart Failure Sister     Allergies  Allergen Reactions   Penicillins Hives and Rash    Has patient had a PCN reaction causing immediate rash, facial/tongue/throat swelling, SOB or lightheadedness with hypotension: Yes Has patient had a PCN reaction causing severe rash involving mucus membranes or skin necrosis: No Has patient had a PCN reaction that required hospitalization No Has patient had a PCN reaction occurring within the last 10 years: No If all of the above answers are NO, then may proceed with Cephalosporin use.   Metronidazole Rash    Other reaction(s): Skin Rashes, Hives    Outpatient Medications Prior to Visit  Medication Sig   acetaminophen  (TYLENOL ) 500 MG tablet Take 1,000 mg by mouth every 6 (six) hours as needed.   albuterol  (VENTOLIN  HFA) 108 (90 Base) MCG/ACT inhaler INHALE 1 TO 2 PUFFS BY MOUTH EVERY 6 HOURS AS NEEDED FOR SHORTNESS OF BREATH OR WHEEZING (RESCUE INHALER)   aspirin  81 MG chewable tablet Chew 81 mg by mouth daily.   atorvastatin  (LIPITOR) 20 MG tablet TAKE 1 TABLET BY MOUTH EVERY EVENING   cetirizine  (ZYRTEC ) 10 MG tablet TAKE 1 TABLET BY MOUTH NIGHTLY AT BEDTIME FOR SEASONAL ALLERGIES   clopidogrel   (PLAVIX ) 75 MG tablet Take 1 tablet (75 mg total) by mouth daily.   dapagliflozin  propanediol (FARXIGA ) 10 MG TABS tablet Take 1 tablet (10 mg total) by mouth daily before breakfast.   diclofenac Sodium (VOLTAREN) 1 % GEL APPLY 2 GRAMS TO BILATERAL KNEES EVERY 8HOURS AS NEEDED FOR ARTHRITIS   escitalopram  (LEXAPRO ) 10 MG tablet TAKE 1 TABLET BY MOUTH DAILY   famotidine  (PEPCID ) 20 MG tablet Take 1 tablet (20 mg total) by mouth 2 (two) times daily.   gabapentin (NEURONTIN) 300 MG capsule Take 900 mg by mouth 2 (two) times daily.   hydrOXYzine  (ATARAX /VISTARIL ) 50 MG tablet Take 50  mg by mouth at bedtime.   LATUDA 60 MG TABS Take 1 tablet by mouth at bedtime.   losartan  (COZAAR ) 100 MG tablet Take 1 tablet (100 mg total) by mouth daily.   montelukast  (SINGULAIR ) 10 MG tablet TAKE 1 TABLET BY MOUTH DAILY   naproxen  (NAPROSYN ) 500 MG tablet Take 1 tablet (500 mg total) by mouth 2 (two) times daily with a meal.   oxybutynin  (DITROPAN -XL) 10 MG 24 hr tablet TAKE 1 TABLET BY MOUTH DAILY   pantoprazole  (PROTONIX ) 40 MG tablet TAKE 1 TABLET BY MOUTH DAILY   REXULTI 2 MG TABS tablet Take 2 mg by mouth daily.   spironolactone (ALDACTONE) 25 MG tablet TAKE 1 TABLET BY MOUTH DAILY   zolpidem  (AMBIEN  CR) 6.25 MG CR tablet TAKE 1 TABLET BY MOUTH AT BEDTIME AS NEEDED FOR INSOMNIA   [DISCONTINUED] tirzepatide  (ZEPBOUND ) 5 MG/0.5ML Pen Inject 5 mg into the skin once a week.   [DISCONTINUED] doxycycline  (VIBRA -TABS) 100 MG tablet Take 1 tablet (100 mg total) by mouth 2 (two) times daily.   [DISCONTINUED] ondansetron  (ZOFRAN ) 4 MG tablet Take 1 tablet (4 mg total) by mouth every 8 (eight) hours as needed for nausea or vomiting. (Patient not taking: Reported on 05/22/2024)   No facility-administered medications prior to visit.    Review of Systems  Constitutional: Negative.   HENT: Negative.    Eyes: Negative.   Respiratory: Negative.  Negative for shortness of breath.   Cardiovascular: Negative.  Negative for  chest pain.  Gastrointestinal: Negative.  Negative for abdominal pain, constipation and diarrhea.  Genitourinary: Negative.   Musculoskeletal:  Negative for joint pain and myalgias.  Skin: Negative.   Neurological: Negative.  Negative for dizziness and headaches.  Endo/Heme/Allergies:  Bruises/bleeds easily.  All other systems reviewed and are negative.      Objective:   BP 116/70   Pulse 76   Ht 5' 1 (1.549 m)   Wt 300 lb (136.1 kg)   SpO2 93%   BMI 56.68 kg/m   Vitals:   06/07/24 1035  BP: 116/70  Pulse: 76  Height: 5' 1 (1.549 m)  Weight: 300 lb (136.1 kg)  SpO2: 93%  BMI (Calculated): 56.71    Physical Exam Vitals and nursing note reviewed.  Constitutional:      Appearance: Normal appearance. She is normal weight.  HENT:     Head: Normocephalic and atraumatic.     Nose: Nose normal.     Mouth/Throat:     Mouth: Mucous membranes are moist.  Eyes:     Extraocular Movements: Extraocular movements intact.     Conjunctiva/sclera: Conjunctivae normal.     Pupils: Pupils are equal, round, and reactive to light.  Cardiovascular:     Rate and Rhythm: Normal rate and regular rhythm.     Pulses: Normal pulses.     Heart sounds: Normal heart sounds.  Pulmonary:     Effort: Pulmonary effort is normal.     Breath sounds: Normal breath sounds.  Abdominal:     General: Abdomen is flat. Bowel sounds are normal.     Palpations: Abdomen is soft.  Musculoskeletal:        General: Normal range of motion.     Cervical back: Normal range of motion.  Skin:    General: Skin is warm and dry.  Neurological:     General: No focal deficit present.     Mental Status: She is alert and oriented to person, place, and time.  Psychiatric:  Mood and Affect: Mood normal.        Behavior: Behavior normal.        Thought Content: Thought content normal.        Judgment: Judgment normal.      No results found for any visits on 06/07/24.  Recent Results (from the past  2160 hours)  Lipid Profile     Status: None   Collection Time: 05/22/24 11:24 AM  Result Value Ref Range   Cholesterol, Total 152 100 - 199 mg/dL   Triglycerides 70 0 - 149 mg/dL   HDL 80 >60 mg/dL   VLDL Cholesterol Cal 14 5 - 40 mg/dL   LDL Chol Calc (NIH) 58 0 - 99 mg/dL   Chol/HDL Ratio 1.9 0.0 - 4.4 ratio    Comment:                                   T. Chol/HDL Ratio                                             Men  Women                               1/2 Avg.Risk  3.4    3.3                                   Avg.Risk  5.0    4.4                                2X Avg.Risk  9.6    7.1                                3X Avg.Risk 23.4   11.0   CMP14+EGFR     Status: Abnormal   Collection Time: 05/22/24 11:24 AM  Result Value Ref Range   Glucose 139 (H) 70 - 99 mg/dL   BUN 16 8 - 27 mg/dL   Creatinine, Ser 8.51 (H) 0.57 - 1.00 mg/dL   eGFR 40 (L) >40 fO/fpw/8.26   BUN/Creatinine Ratio 11 (L) 12 - 28   Sodium 142 134 - 144 mmol/L   Potassium 4.4 3.5 - 5.2 mmol/L   Chloride 104 96 - 106 mmol/L   CO2 23 20 - 29 mmol/L   Calcium  9.7 8.7 - 10.3 mg/dL   Total Protein 6.6 6.0 - 8.5 g/dL   Albumin 4.4 3.8 - 4.9 g/dL   Globulin, Total 2.2 1.5 - 4.5 g/dL   Bilirubin Total 0.8 0.0 - 1.2 mg/dL   Alkaline Phosphatase 53 49 - 135 IU/L    Comment:               **Please note reference interval change**   AST 17 0 - 40 IU/L   ALT 12 0 - 32 IU/L  TSH     Status: None   Collection Time: 05/22/24 11:24 AM  Result Value Ref Range   TSH 1.990 0.450 - 4.500 uIU/mL  Hemoglobin A1c     Status: None  Collection Time: 05/22/24 11:24 AM  Result Value Ref Range   Hgb A1c MFr Bld 5.6 4.8 - 5.6 %    Comment:          Prediabetes: 5.7 - 6.4          Diabetes: >6.4          Glycemic control for adults with diabetes: <7.0    Est. average glucose Bld gHb Est-mCnc 114 mg/dL  CBC with Differential/Platelet     Status: Abnormal   Collection Time: 05/22/24 11:24 AM  Result Value Ref Range   WBC 4.7  3.4 - 10.8 x10E3/uL   RBC 4.55 3.77 - 5.28 x10E6/uL   Hemoglobin 12.8 11.1 - 15.9 g/dL   Hematocrit 59.9 65.9 - 46.6 %   MCV 88 79 - 97 fL   MCH 28.1 26.6 - 33.0 pg   MCHC 32.0 31.5 - 35.7 g/dL   RDW 84.1 (H) 88.2 - 84.5 %   Platelets 230 150 - 450 x10E3/uL   Neutrophils 39 Not Estab. %   Lymphs 45 Not Estab. %   Monocytes 8 Not Estab. %   Eos 7 Not Estab. %   Basos 1 Not Estab. %   Neutrophils Absolute 1.8 1.4 - 7.0 x10E3/uL   Lymphocytes Absolute 2.1 0.7 - 3.1 x10E3/uL   Monocytes Absolute 0.4 0.1 - 0.9 x10E3/uL   EOS (ABSOLUTE) 0.3 0.0 - 0.4 x10E3/uL   Basophils Absolute 0.1 0.0 - 0.2 x10E3/uL   Immature Granulocytes 0 Not Estab. %   Immature Grans (Abs) 0.0 0.0 - 0.1 x10E3/uL  Iron, TIBC and Ferritin Panel     Status: None   Collection Time: 05/22/24 11:24 AM  Result Value Ref Range   Total Iron Binding Capacity 271 250 - 450 ug/dL   UIBC 797 868 - 574 ug/dL   Iron 69 27 - 840 ug/dL   Iron Saturation 25 15 - 55 %   Ferritin 119 15 - 150 ng/mL      Assessment & Plan:  Increase Zepbound  to 10 mg weekly Flu vaccine today  Problem List Items Addressed This Visit       Cardiovascular and Mediastinum   Hypertension - Primary     Other   Dyslipidemia   Other Visit Diagnoses       Flu vaccine need       Relevant Orders   Influenza, MDCK, trivalent, PF(Flucelvax egg-free) (Completed)       Return in about 4 months (around 10/08/2024) for fasting lab work prior.   Total time spent: 25 minutes  [employer], NP  06/07/2024   This document may have been prepared by Dragon Voice Recognition software and as such may include unintentional dictation errors.

## 2024-06-07 NOTE — Addendum Note (Signed)
 Addended by: CARIN GAUZE on: 06/07/2024 12:56 PM   Modules accepted: Orders

## 2024-06-11 ENCOUNTER — Telehealth: Payer: Self-pay

## 2024-06-11 NOTE — Telephone Encounter (Signed)
 Pt Lindsay Cooke asking for call back but did not state what she needed

## 2024-06-12 ENCOUNTER — Other Ambulatory Visit: Payer: Self-pay

## 2024-06-12 ENCOUNTER — Telehealth: Payer: Self-pay

## 2024-06-12 MED ORDER — ALBUTEROL SULFATE HFA 108 (90 BASE) MCG/ACT IN AERS: 1.0000 | INHALATION_SPRAY | RESPIRATORY_TRACT | 1 refills | Status: AC | PRN

## 2024-06-12 NOTE — Telephone Encounter (Signed)
 Pt LM but did not state what the call was regarding, pt asking for call back

## 2024-06-13 ENCOUNTER — Telehealth: Payer: Self-pay | Admitting: Cardiology

## 2024-06-13 NOTE — Telephone Encounter (Signed)
 Patient called in saying she doesn't have any symptoms from her respiratory infection. She says she has yellow phlegm coming up when she coughs. Admits to watery eyes. Admits to difficulty breathing. Denies sore throat, sneezing, itchy eyes. Admits to wheezing.  Requesting albuterol  for her nebulizer machine. She is also requesting some medication be sent in for her respiratory infection.

## 2024-06-14 ENCOUNTER — Other Ambulatory Visit: Payer: Self-pay | Admitting: Cardiology

## 2024-06-14 ENCOUNTER — Telehealth: Payer: Self-pay

## 2024-06-14 MED ORDER — AZITHROMYCIN 250 MG PO TABS: ORAL_TABLET | ORAL | 0 refills | Status: AC

## 2024-06-14 MED ORDER — ALBUTEROL SULFATE (2.5 MG/3ML) 0.083% IN NEBU: 2.5000 mg | INHALATION_SOLUTION | RESPIRATORY_TRACT | 2 refills | Status: AC | PRN

## 2024-06-14 NOTE — Telephone Encounter (Signed)
 See other note

## 2024-06-14 NOTE — Telephone Encounter (Signed)
 Pt left VM with no message. Call pt back.

## 2024-06-14 NOTE — Telephone Encounter (Signed)
 Pt LM asking for call back, did not state what she needed

## 2024-06-20 ENCOUNTER — Telehealth: Payer: Self-pay | Admitting: Cardiology

## 2024-06-20 NOTE — Telephone Encounter (Signed)
 Patient left VM stating she still has a cough and needs more meds. Please advise.

## 2024-06-21 ENCOUNTER — Telehealth: Payer: Self-pay

## 2024-06-21 ENCOUNTER — Ambulatory Visit: Admitting: Cardiovascular Disease

## 2024-06-21 ENCOUNTER — Other Ambulatory Visit: Payer: Self-pay | Admitting: Cardiology

## 2024-06-21 DIAGNOSIS — M1711 Unilateral primary osteoarthritis, right knee: Secondary | ICD-10-CM | POA: Diagnosis not present

## 2024-06-21 DIAGNOSIS — M25561 Pain in right knee: Secondary | ICD-10-CM | POA: Diagnosis not present

## 2024-06-21 MED ORDER — BENZONATATE 100 MG PO CAPS: 100.0000 mg | ORAL_CAPSULE | Freq: Three times a day (TID) | ORAL | 1 refills | Status: DC | PRN

## 2024-06-21 NOTE — Telephone Encounter (Signed)
 Pt LM asking for something to be called in for her cough and cold, looks like amber sent this in already so the pharmacy should have called to pick up already

## 2024-06-22 ENCOUNTER — Other Ambulatory Visit: Payer: Self-pay | Admitting: Cardiovascular Disease

## 2024-06-22 DIAGNOSIS — E782 Mixed hyperlipidemia: Secondary | ICD-10-CM

## 2024-06-22 DIAGNOSIS — R0602 Shortness of breath: Secondary | ICD-10-CM

## 2024-06-22 DIAGNOSIS — I10 Essential (primary) hypertension: Secondary | ICD-10-CM

## 2024-06-22 DIAGNOSIS — G4733 Obstructive sleep apnea (adult) (pediatric): Secondary | ICD-10-CM

## 2024-06-25 ENCOUNTER — Other Ambulatory Visit: Payer: Self-pay | Admitting: Internal Medicine

## 2024-06-25 ENCOUNTER — Other Ambulatory Visit: Payer: Self-pay | Admitting: Cardiology

## 2024-06-25 DIAGNOSIS — F5101 Primary insomnia: Secondary | ICD-10-CM

## 2024-06-26 ENCOUNTER — Encounter: Payer: Self-pay | Admitting: Cardiovascular Disease

## 2024-06-26 ENCOUNTER — Ambulatory Visit (INDEPENDENT_AMBULATORY_CARE_PROVIDER_SITE_OTHER): Admitting: Cardiovascular Disease

## 2024-06-26 VITALS — BP 104/64 | HR 82 | Ht 61.0 in | Wt 300.2 lb

## 2024-06-26 DIAGNOSIS — Z0130 Encounter for examination of blood pressure without abnormal findings: Secondary | ICD-10-CM

## 2024-06-26 DIAGNOSIS — J40 Bronchitis, not specified as acute or chronic: Secondary | ICD-10-CM

## 2024-06-26 DIAGNOSIS — G4733 Obstructive sleep apnea (adult) (pediatric): Secondary | ICD-10-CM | POA: Diagnosis not present

## 2024-06-26 DIAGNOSIS — Z131 Encounter for screening for diabetes mellitus: Secondary | ICD-10-CM

## 2024-06-26 DIAGNOSIS — I2510 Atherosclerotic heart disease of native coronary artery without angina pectoris: Secondary | ICD-10-CM

## 2024-06-26 MED ORDER — LEVOFLOXACIN 250 MG PO TABS: 250.0000 mg | ORAL_TABLET | Freq: Every day | ORAL | 0 refills | Status: AC

## 2024-06-26 NOTE — Telephone Encounter (Signed)
 Patient is taking those and has an appt with cards today will follow up with her in person to see how she's feeling

## 2024-06-26 NOTE — Progress Notes (Signed)
 Cardiology Office Note   Date:  06/26/2024   ID:  Babe, Anthis Jul 10, 1961, MRN [id number]  PCP:  Carin Gauze, NP  Cardiologist:  Denyse Bathe, MD      History of Present Illness: Lindsay Cooke is a 63 y.o. female who presents for  Chief Complaint  Patient presents with   Follow-up    3 month follow up    Having chest cold, cough.      Past Medical History:  Diagnosis Date   Allergy    Anemia    Anxiety    Arthritis    Asthma    Benign essential hypertension    Calcaneal spur, left    Class 3 severe obesity with serious comorbidity and body mass index (BMI) of 60.0 to 69.9 in adult, unspecified obesity type (HCC)    Depression    Dyspnea    Dysrhythmia    Equinus deformity of both feet    GERD (gastroesophageal reflux disease)    Hyperlipidemia    Left foot pain    Lumbar spondylosis    Obesity    Pneumonia    Pre-diabetes    Reflux    Right leg DVT (HCC)    Sleep apnea    no cpap   Stroke (HCC)    Tendonitis, Achilles, left      Past Surgical History:  Procedure Laterality Date   ABDOMINAL HYSTERECTOMY     ACHILLES TENDON SURGERY Left 12/15/2023   Procedure: REPAIR, TENDON, ACHILLES;  Surgeon: Lennie Barter, DPM;  Location: [facility] ORS;  Service: Orthopedics/Podiatry;  Laterality: Left;   CHOLECYSTECTOMY     COLONOSCOPY WITH PROPOFOL  N/A 06/16/2017   Procedure: COLONOSCOPY WITH PROPOFOL ;  Surgeon: Unk Corinn Skiff, MD;  Location: [facility] ENDOSCOPY;  Service: Gastroenterology;  Laterality: N/A;   COLONOSCOPY WITH PROPOFOL  N/A 11/10/2020   Procedure: COLONOSCOPY WITH PROPOFOL ;  Surgeon: Unk Corinn Skiff, MD;  Location: [facility] ENDOSCOPY;  Service: Gastroenterology;  Laterality: N/A;   GASTROC RECESSION EXTREMITY Left 12/15/2023   Procedure: RECESSION, TENDON, GASTROCNEMIUS;  Surgeon: Lennie Barter, DPM;  Location: [facility] ORS;  Service: Orthopedics/Podiatry;  Laterality: Left;   HEEL SPUR EXCISION     HERNIA REPAIR     KELOID EXCISION      OSTECTOMY Left 12/15/2023   Procedure: OSTECTOMY;  Surgeon: Lennie Barter, DPM;  Location: [facility] ORS;  Service: Orthopedics/Podiatry;  Laterality: Left;   TENDON TRANSFER Left 12/15/2023   Procedure: TRANSFER, TENDON;  Surgeon: Lennie Barter, DPM;  Location: [facility] ORS;  Service: Orthopedics/Podiatry;  Laterality: Left;     Current Outpatient Medications  Medication Sig Dispense Refill   acetaminophen  (TYLENOL ) 500 MG tablet Take 1,000 mg by mouth every 6 (six) hours as needed.     albuterol  (PROVENTIL ) (2.5 MG/3ML) 0.083% nebulizer solution Take 3 mLs (2.5 mg total) by nebulization every 4 (four) hours as needed for wheezing or shortness of breath. 75 mL 2   albuterol  (VENTOLIN  HFA) 108 (90 Base) MCG/ACT inhaler Inhale 1-2 puffs into the lungs every 4 (four) hours as needed for wheezing or shortness of breath. 8.5 g 1   aspirin  81 MG chewable tablet Chew 81 mg by mouth daily.     atorvastatin  (LIPITOR) 20 MG tablet TAKE 1 TABLET BY MOUTH EVERY EVENING 90 tablet 3   benzonatate  (TESSALON  PERLES) 100 MG capsule Take 1 capsule (100 mg total) by mouth 3 (three) times daily as needed for cough. 30 capsule 1   cetirizine  (ZYRTEC ) 10 MG tablet TAKE 1 TABLET  BY MOUTH NIGHTLY AT BEDTIME FOR SEASONAL ALLERGIES 30 tablet 3   clopidogrel  (PLAVIX ) 75 MG tablet Take 1 tablet (75 mg total) by mouth daily. 90 tablet 3   escitalopram  (LEXAPRO ) 10 MG tablet TAKE 1 TABLET BY MOUTH DAILY 90 tablet 3   famotidine  (PEPCID ) 20 MG tablet TAKE 1 TABLET BY MOUTH TWICE A DAY 60 tablet 1   FARXIGA  10 MG TABS tablet TAKE 1 TABLET BY MOUTH DAILY BEFORE BREAKFAST 30 tablet 3   gabapentin (NEURONTIN) 300 MG capsule Take 900 mg by mouth 2 (two) times daily.     hydrOXYzine  (ATARAX /VISTARIL ) 50 MG tablet Take 50 mg by mouth at bedtime.     LATUDA 60 MG TABS Take 1 tablet by mouth at bedtime.     levofloxacin (LEVAQUIN) 250 MG tablet Take 1 tablet (250 mg total) by mouth daily for 7 days. 7 tablet 0   losartan  (COZAAR ) 100 MG  tablet Take 1 tablet (100 mg total) by mouth daily. 90 tablet 3   montelukast  (SINGULAIR ) 10 MG tablet TAKE 1 TABLET BY MOUTH DAILY 90 tablet 1   naproxen  (NAPROSYN ) 500 MG tablet Take 1 tablet (500 mg total) by mouth 2 (two) times daily with a meal. 20 tablet 2   oxybutynin  (DITROPAN -XL) 10 MG 24 hr tablet TAKE 1 TABLET BY MOUTH DAILY 30 tablet 2   pantoprazole  (PROTONIX ) 40 MG tablet TAKE 1 TABLET BY MOUTH DAILY 90 tablet 1   REXULTI 2 MG TABS tablet Take 2 mg by mouth daily.     spironolactone (ALDACTONE) 25 MG tablet TAKE 1 TABLET BY MOUTH DAILY 90 tablet 0   tirzepatide  (ZEPBOUND ) 7.5 MG/0.5ML Pen Inject 7.5 mg into the skin once a week. 2 mL 5   zolpidem  (AMBIEN  CR) 6.25 MG CR tablet TAKE 1 TABLET BY MOUTH AT BEDTIME AS NEEDED FOR INSOMNIA 30 tablet 0   No current facility-administered medications for this visit.    Allergies:   Penicillins and Metronidazole    Social History:   reports that she has never smoked. She has never used smokeless tobacco. She reports that she does not drink alcohol and does not use drugs.   Family History:  family history includes Breast cancer in her sister; Congestive Heart Failure in her sister; Heart disease in her mother.    ROS:     Review of Systems  Constitutional: Negative.   HENT: Negative.    Eyes: Negative.   Respiratory: Negative.    Gastrointestinal: Negative.   Genitourinary: Negative.   Musculoskeletal: Negative.   Skin: Negative.   Neurological: Negative.   Endo/Heme/Allergies: Negative.   Psychiatric/Behavioral: Negative.    All other systems reviewed and are negative.     All other systems are reviewed and negative.    PHYSICAL EXAM: VS:  BP 104/64   Pulse 82   Ht 5' 1 (1.549 m)   Wt (!) 300 lb 3.2 oz (136.2 kg)   SpO2 98%   BMI 56.72 kg/m  , BMI Body mass index is 56.72 kg/m. Last weight:  Wt Readings from Last 3 Encounters:  06/26/24 (!) 300 lb 3.2 oz (136.2 kg)  06/07/24 300 lb (136.1 kg)  05/22/24 298 lb  (135.2 kg)     Physical Exam Constitutional:      Appearance: Normal appearance.  Cardiovascular:     Rate and Rhythm: Normal rate and regular rhythm.     Heart sounds: Normal heart sounds.  Pulmonary:     Effort: Pulmonary effort is normal.  Breath sounds: Normal breath sounds.  Musculoskeletal:     Right lower leg: No edema.     Left lower leg: No edema.  Neurological:     Mental Status: She is alert.       EKG:   Recent Labs: 05/22/2024: ALT 12; BUN 16; Creatinine, Ser 1.48; Hemoglobin 12.8; Platelets 230; Potassium 4.4; Sodium 142; TSH 1.990    Lipid Panel    Component Value Date/Time   CHOL 152 05/22/2024 1124   TRIG 70 05/22/2024 1124   HDL 80 05/22/2024 1124   CHOLHDL 1.9 05/22/2024 1124   CHOLHDL 2.8 11/25/2015 0502   VLDL 10 11/25/2015 0502   LDLCALC 58 05/22/2024 1124      Other studies Reviewed: Additional studies/ records that were reviewed today include:  Review of the above records demonstrates:       No data to display            ASSESSMENT AND PLAN:    ICD-10-CM   1. Bronchitis  J40 levofloxacin (LEVAQUIN) 250 MG tablet   Has cold symptoms, with cough, with yellow sputum. Advise levaquin for 7 days.    2. 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 Obstructive sleep apnea syndrome  G47.33 levofloxacin (LEVAQUIN) 250 MG tablet    4. Morbid obesity (HCC)  E66.01 levofloxacin (LEVAQUIN) 250 MG tablet       Problem List Items Addressed This Visit       Cardiovascular and Mediastinum   Coronary artery disease   Relevant Medications   levofloxacin (LEVAQUIN) 250 MG tablet     Respiratory   Sleep apnea   Relevant Medications   levofloxacin (LEVAQUIN) 250 MG tablet     Other   Morbid obesity (HCC)   Relevant Medications   levofloxacin (LEVAQUIN) 250 MG tablet   Other Visit Diagnoses       Bronchitis    -  Primary    Has cold symptoms, with cough, with yellow sputum. Advise levaquin for 7 days.   Relevant Medications   levofloxacin (LEVAQUIN) 250 MG tablet          Disposition:   Return in about 4 weeks (around 07/24/2024).    Total time spent: 30 minutes  Signed,  Denyse Bathe, MD  06/26/2024 10:39 AM    [facility]

## 2024-07-03 ENCOUNTER — Telehealth: Payer: Self-pay

## 2024-07-03 NOTE — Telephone Encounter (Signed)
 Pt LM stating that she's still bruising a lot and she still has the cough she can't get rid of please advise

## 2024-07-06 ENCOUNTER — Encounter: Payer: Self-pay | Admitting: Cardiovascular Disease

## 2024-07-06 ENCOUNTER — Ambulatory Visit (INDEPENDENT_AMBULATORY_CARE_PROVIDER_SITE_OTHER): Admitting: Cardiovascular Disease

## 2024-07-06 ENCOUNTER — Ambulatory Visit
Admission: RE | Admit: 2024-07-06 | Discharge: 2024-07-06 | Disposition: A | Discharge status: Home / Self Care | Source: Ambulatory Visit | Attending: Cardiovascular Disease | Admitting: Cardiovascular Disease

## 2024-07-06 ENCOUNTER — Ambulatory Visit
Admission: RE | Admit: 2024-07-06 | Discharge: 2024-07-06 | Disposition: A | Discharge status: Home / Self Care | Attending: Cardiovascular Disease | Admitting: Cardiovascular Disease

## 2024-07-06 VITALS — BP 108/72 | HR 87 | Ht 61.0 in | Wt 300.4 lb

## 2024-07-06 DIAGNOSIS — N1831 Chronic kidney disease, stage 3a: Secondary | ICD-10-CM | POA: Insufficient documentation

## 2024-07-06 DIAGNOSIS — I2510 Atherosclerotic heart disease of native coronary artery without angina pectoris: Secondary | ICD-10-CM

## 2024-07-06 DIAGNOSIS — J40 Bronchitis, not specified as acute or chronic: Secondary | ICD-10-CM | POA: Diagnosis not present

## 2024-07-06 DIAGNOSIS — Z0130 Encounter for examination of blood pressure without abnormal findings: Secondary | ICD-10-CM

## 2024-07-06 DIAGNOSIS — G4733 Obstructive sleep apnea (adult) (pediatric): Secondary | ICD-10-CM

## 2024-07-06 DIAGNOSIS — R233 Spontaneous ecchymoses: Secondary | ICD-10-CM

## 2024-07-06 DIAGNOSIS — Z131 Encounter for screening for diabetes mellitus: Secondary | ICD-10-CM

## 2024-07-06 NOTE — Progress Notes (Signed)
 Cardiology Office Note   Date:  07/06/2024   ID:  Lindsay, Cooke 04-Sep-1961, MRN [id number]  PCP:  Carin Gauze, NP  Cardiologist:  Denyse Bathe, MD      History of Present Illness: Lindsay Cooke is a 63 y.o. female who presents for  Chief Complaint  Patient presents with   Follow-up    4 week follow up    Has congestion, cough, productive of yellow sputum, thinks may have pneumonia. Also has bruises on legs.      Past Medical History:  Diagnosis Date   Allergy    Anemia    Anxiety    Arthritis    Asthma    Benign essential hypertension    Calcaneal spur, left    Class 3 severe obesity with serious comorbidity and body mass index (BMI) of 60.0 to 69.9 in adult, unspecified obesity type (HCC)    Depression    Dyspnea    Dysrhythmia    Equinus deformity of both feet    GERD (gastroesophageal reflux disease)    Hyperlipidemia    Left foot pain    Lumbar spondylosis    Obesity    Pneumonia    Pre-diabetes    Reflux    Right leg DVT (HCC)    Sleep apnea    no cpap   Stroke (HCC)    Tendonitis, Achilles, left      Past Surgical History:  Procedure Laterality Date   ABDOMINAL HYSTERECTOMY     ACHILLES TENDON SURGERY Left 12/15/2023   Procedure: REPAIR, TENDON, ACHILLES;  Surgeon: Lennie Barter, DPM;  Location: [facility];  Service: Orthopedics/Podiatry;  Laterality: Left;   CHOLECYSTECTOMY     COLONOSCOPY WITH PROPOFOL  N/A 06/16/2017   Procedure: COLONOSCOPY WITH PROPOFOL ;  Surgeon: Unk Corinn Skiff, MD;  Location: [facility];  Service: Gastroenterology;  Laterality: N/A;   COLONOSCOPY WITH PROPOFOL  N/A 11/10/2020   Procedure: COLONOSCOPY WITH PROPOFOL ;  Surgeon: Unk Corinn Skiff, MD;  Location: [facility];  Service: Gastroenterology;  Laterality: N/A;   GASTROC RECESSION EXTREMITY Left 12/15/2023   Procedure: RECESSION, TENDON, GASTROCNEMIUS;  Surgeon: Lennie Barter, DPM;  Location: [facility];  Service: Orthopedics/Podiatry;   Laterality: Left;   HEEL SPUR EXCISION     HERNIA REPAIR     KELOID EXCISION     OSTECTOMY Left 12/15/2023   Procedure: OSTECTOMY;  Surgeon: Lennie Barter, DPM;  Location: [facility];  Service: Orthopedics/Podiatry;  Laterality: Left;   TENDON TRANSFER Left 12/15/2023   Procedure: TRANSFER, TENDON;  Surgeon: Lennie Barter, DPM;  Location: [facility];  Service: Orthopedics/Podiatry;  Laterality: Left;     Current Outpatient Medications  Medication Sig Dispense Refill   acetaminophen  (TYLENOL ) 500 MG tablet Take 1,000 mg by mouth every 6 (six) hours as needed.     albuterol  (PROVENTIL ) (2.5 MG/3ML) 0.083% nebulizer solution Take 3 mLs (2.5 mg total) by nebulization every 4 (four) hours as needed for wheezing or shortness of breath. 75 mL 2   albuterol  (VENTOLIN  HFA) 108 (90 Base) MCG/ACT inhaler Inhale 1-2 puffs into the lungs every 4 (four) hours as needed for wheezing or shortness of breath. 8.5 g 1   aspirin  81 MG chewable tablet Chew 81 mg by mouth daily.     atorvastatin  (LIPITOR) 20 MG tablet TAKE 1 TABLET BY MOUTH EVERY EVENING 90 tablet 3   benzonatate  (TESSALON  PERLES) 100 MG capsule Take 1 capsule (100 mg total) by mouth 3 (three) times daily as needed for cough. 30  capsule 1   clopidogrel  (PLAVIX ) 75 MG tablet Take 1 tablet (75 mg total) by mouth daily. 90 tablet 3   escitalopram  (LEXAPRO ) 10 MG tablet TAKE 1 TABLET BY MOUTH DAILY 90 tablet 3   famotidine  (PEPCID ) 20 MG tablet TAKE 1 TABLET BY MOUTH TWICE A DAY 60 tablet 1   FARXIGA  10 MG TABS tablet TAKE 1 TABLET BY MOUTH DAILY BEFORE BREAKFAST 30 tablet 3   gabapentin (NEURONTIN) 300 MG capsule Take 900 mg by mouth 2 (two) times daily.     hydrOXYzine  (ATARAX /VISTARIL ) 50 MG tablet Take 50 mg by mouth at bedtime.     LATUDA 60 MG TABS Take 1 tablet by mouth at bedtime.     losartan  (COZAAR ) 100 MG tablet Take 1 tablet (100 mg total) by mouth daily. 90 tablet 3   montelukast  (SINGULAIR ) 10 MG tablet TAKE 1 TABLET BY MOUTH DAILY 90  tablet 1   naproxen  (NAPROSYN ) 500 MG tablet Take 1 tablet (500 mg total) by mouth 2 (two) times daily with a meal. 20 tablet 2   oxybutynin  (DITROPAN -XL) 10 MG 24 hr tablet TAKE 1 TABLET BY MOUTH DAILY 30 tablet 2   pantoprazole  (PROTONIX ) 40 MG tablet TAKE 1 TABLET BY MOUTH DAILY 90 tablet 1   REXULTI 2 MG TABS tablet Take 2 mg by mouth daily.     spironolactone (ALDACTONE) 25 MG tablet TAKE 1 TABLET BY MOUTH DAILY 90 tablet 0   tirzepatide  (ZEPBOUND ) 7.5 MG/0.5ML Pen Inject 7.5 mg into the skin once a week. 2 mL 5   zolpidem  (AMBIEN  CR) 6.25 MG CR tablet TAKE 1 TABLET BY MOUTH AT BEDTIME AS NEEDED FOR INSOMNIA 30 tablet 0   cetirizine  (ZYRTEC ) 10 MG tablet TAKE 1 TABLET BY MOUTH NIGHTLY AT BEDTIME FOR SEASONAL ALLERGIES (Patient not taking: Reported on 07/06/2024) 30 tablet 3   No current facility-administered medications for this visit.    Allergies:   Penicillins and Metronidazole    Social History:   reports that she has never smoked. She has never used smokeless tobacco. She reports that she does not drink alcohol and does not use drugs.   Family History:  family history includes Breast cancer in her sister; Congestive Heart Failure in her sister; Heart disease in her mother.    ROS:     Review of Systems  Constitutional: Negative.   HENT: Negative.    Eyes: Negative.   Respiratory: Negative.    Gastrointestinal: Negative.   Genitourinary: Negative.   Musculoskeletal: Negative.   Skin: Negative.   Neurological: Negative.   Endo/Heme/Allergies: Negative.   Psychiatric/Behavioral: Negative.    All other systems reviewed and are negative.     All other systems are reviewed and negative.    PHYSICAL EXAM: VS:  BP 108/72   Pulse 87   Ht 5' 1 (1.549 m)   Wt (!) 300 lb 6.4 oz (136.3 kg)   SpO2 96%   BMI 56.76 kg/m  , BMI Body mass index is 56.76 kg/m. Last weight:  Wt Readings from Last 3 Encounters:  07/06/24 (!) 300 lb 6.4 oz (136.3 kg)  06/26/24 (!) 300 lb 3.2  oz (136.2 kg)  06/07/24 300 lb (136.1 kg)     Physical Exam Constitutional:      Appearance: Normal appearance.  Cardiovascular:     Rate and Rhythm: Normal rate and regular rhythm.     Heart sounds: Normal heart sounds.  Pulmonary:     Effort: Pulmonary effort is normal.  Breath sounds: Normal breath sounds.  Musculoskeletal:     Right lower leg: No edema.     Left lower leg: No edema.  Neurological:     Mental Status: She is alert.       EKG:   Recent Labs: 05/22/2024: ALT 12; BUN 16; Creatinine, Ser 1.48; Hemoglobin 12.8; Platelets 230; Potassium 4.4; Sodium 142; TSH 1.990    Lipid Panel    Component Value Date/Time   CHOL 152 05/22/2024 1124   TRIG 70 05/22/2024 1124   HDL 80 05/22/2024 1124   CHOLHDL 1.9 05/22/2024 1124   CHOLHDL 2.8 11/25/2015 0502   VLDL 10 11/25/2015 0502   LDLCALC 58 05/22/2024 1124      Other studies Reviewed: Additional studies/ records that were reviewed today include:  Review of the above records demonstrates:       No data to display            ASSESSMENT AND PLAN:    ICD-10-CM   1. Easy bruising  R23.3 DG Chest 2 View   on plavix  as had CVa in past.    2. Morbid obesity (HCC)  E66.01 DG Chest 2 View    3. Obstructive sleep apnea syndrome  G47.33 DG Chest 2 View    4. Coronary artery disease involving native coronary artery of native heart without angina pectoris  I25.10 DG Chest 2 View    5. Bronchitis  J40 DG Chest 2 View   Took Levaquin 250 daily but no improvement. Advise see Amber, may need CXR.    6. Stage 3a chronic kidney disease (HCC)  N18.31 DG Chest 2 View       Problem List Items Addressed This Visit       Cardiovascular and Mediastinum   Coronary artery disease   Relevant Orders   DG Chest 2 View     Respiratory   Sleep apnea   Relevant Orders   DG Chest 2 View     Genitourinary   Stage 3a chronic kidney disease (HCC)   Relevant Orders   DG Chest 2 View     Other   Morbid obesity  (HCC)   Relevant Orders   DG Chest 2 View   Other Visit Diagnoses       Easy bruising    -  Primary   on plavix  as had CVa in past.   Relevant Orders   DG Chest 2 View     Bronchitis       Took Levaquin 250 daily but no improvement. Advise see Amber, may need CXR.   Relevant Orders   DG Chest 2 View          Disposition:   Return in about 4 weeks (around 08/03/2024) for CXR and see Amber as has cough, and f/u me 4 weeks.    Total time spent: 30 minutes  Signed,  Denyse Bathe, MD  07/06/2024 10:01 AM    [facility]

## 2024-07-09 ENCOUNTER — Ambulatory Visit: Payer: Self-pay | Admitting: Cardiology

## 2024-07-10 ENCOUNTER — Encounter: Payer: Self-pay | Admitting: Cardiology

## 2024-07-10 ENCOUNTER — Ambulatory Visit (INDEPENDENT_AMBULATORY_CARE_PROVIDER_SITE_OTHER): Admitting: Cardiology

## 2024-07-10 VITALS — BP 106/68 | HR 77 | Ht 61.0 in | Wt 295.8 lb

## 2024-07-10 DIAGNOSIS — I10 Essential (primary) hypertension: Secondary | ICD-10-CM | POA: Diagnosis not present

## 2024-07-10 DIAGNOSIS — N1831 Chronic kidney disease, stage 3a: Secondary | ICD-10-CM | POA: Diagnosis not present

## 2024-07-10 DIAGNOSIS — J301 Allergic rhinitis due to pollen: Secondary | ICD-10-CM | POA: Insufficient documentation

## 2024-07-10 DIAGNOSIS — J4520 Mild intermittent asthma, uncomplicated: Secondary | ICD-10-CM | POA: Diagnosis not present

## 2024-07-10 MED ORDER — BENZONATATE 100 MG PO CAPS: 100.0000 mg | ORAL_CAPSULE | Freq: Three times a day (TID) | ORAL | 1 refills | Status: DC | PRN

## 2024-07-10 MED ORDER — FLUTICASONE PROPIONATE 50 MCG/ACT NA SUSP: 1.0000 | Freq: Every day | NASAL | 2 refills | Status: AC

## 2024-07-10 MED ORDER — CETIRIZINE HCL 10 MG PO TABS: 10.0000 mg | ORAL_TABLET | Freq: Every day | ORAL | 3 refills | Status: AC

## 2024-07-10 NOTE — Progress Notes (Signed)
 Established Patient Office Visit  Subjective:  Patient ID: Lindsay Cooke, female    DOB: Jun 15, 1961  Age: 63 y.o. MRN: [id number]  Chief Complaint  Patient presents with   Follow-up    Cough/congestion, chest x-ray results    Patient in office for an acute visit, complaining of cough and congestion for the past month. Wants to discuss chest xray results from 07/06/2024. Patient treated with a Z-pack on 06/14/24. Chest xray showed no infectious process. Patient does have wheezing on exam. Patient has not been using her nebulizer. Recommend using her nebulizer, start an antihistamine, Flonase . Will refill tessalon  pearls. Drink plenty of water.  Blood pressure well controlled today. Kidney function decreased, follows with nephrology.     No other concerns at this time.   Past Medical History:  Diagnosis Date   Allergy    Anemia    Anxiety    Arthritis    Asthma    Benign essential hypertension    Calcaneal spur, left    Class 3 severe obesity with serious comorbidity and body mass index (BMI) of 60.0 to 69.9 in adult, unspecified obesity type (HCC)    Depression    Dyspnea    Dysrhythmia    Equinus deformity of both feet    GERD (gastroesophageal reflux disease)    Hyperlipidemia    Left foot pain    Lumbar spondylosis    Obesity    Pneumonia    Pre-diabetes    Reflux    Right leg DVT (HCC)    Sleep apnea    no cpap   Stroke (HCC)    Tendonitis, Achilles, left     Past Surgical History:  Procedure Laterality Date   ABDOMINAL HYSTERECTOMY     ACHILLES TENDON SURGERY Left 12/15/2023   Procedure: REPAIR, TENDON, ACHILLES;  Surgeon: Lennie Barter, DPM;  Location: [facility];  Service: Orthopedics/Podiatry;  Laterality: Left;   CHOLECYSTECTOMY     COLONOSCOPY WITH PROPOFOL  N/A 06/16/2017   Procedure: COLONOSCOPY WITH PROPOFOL ;  Surgeon: Unk Corinn Skiff, MD;  Location: [facility];  Service: Gastroenterology;  Laterality: N/A;   COLONOSCOPY WITH PROPOFOL  N/A  11/10/2020   Procedure: COLONOSCOPY WITH PROPOFOL ;  Surgeon: Unk Corinn Skiff, MD;  Location: [facility] END[facility];  Service: Gastroenterology;  Laterality: N/A;   GASTROC RECESSION EXTREMITY Left 12/15/2023   Procedure: RECESSION, TENDON, GASTROCNEMIUS;  Surgeon: Lennie Barter, DPM;  Location: [facility];  Service: Orthopedics/Podiatry;  Laterality: Left;   HEEL SPUR EXCISION     HERNIA REPAIR     KELOID EXCISION     OSTECTOMY Left 12/15/2023   Procedure: OSTECTOMY;  Surgeon: Lennie Barter, DPM;  Location: [facility];  Service: Orthopedics/Podiatry;  Laterality: Left;   TENDON TRANSFER Left 12/15/2023   Procedure: TRANSFER, TENDON;  Surgeon: Lennie Barter, DPM;  Location: [facility];  Service: Orthopedics/Podiatry;  Laterality: Left;    Social History   Socioeconomic History   Marital status: Single    Spouse name: Not on file   Number of children: Not on file   Years of education: Not on file   Highest education level: Not on file  Occupational History   Not on file  Tobacco Use   Smoking status: Never   Smokeless tobacco: Never  Vaping Use   Vaping status: Never Used  Substance and Sexual Activity   Alcohol use: No    Alcohol/week: 0.0 standard drinks of alcohol   Drug use: No   Sexual activity: Never  Other Topics Concern   Not  on file  Social History Narrative   Lives with daughter   Social Drivers of Health   Financial Resource Strain: High Risk (06/21/2024)   Received from [facility]   Overall Financial Resource Strain (CARDIA)    Difficulty of Paying Living Expenses: Very hard  Food Insecurity: Food Insecurity Present (06/21/2024)   Received [facility]   Hunger Vital Sign    Within the past 12 months, you worried that your food would run out before you got the money to buy more.: Never true    Within the past 12 months, the food you bought just didn't last and you didn't have money to get more.: Often true  Transportation Needs: No  Transportation Needs (06/21/2024)   Received from [facility] - Transportation    In the past 12 months, has lack of transportation kept you from medical appointments or from getting medications?: No    Lack of Transportation (Non-Medical): No  Physical Activity: Not on file  Stress: Not on file  Social Connections: Not on file  Intimate Partner Violence: Not on file    Family History  Problem Relation Age of Onset   Breast cancer Sister    Heart disease Mother    Congestive Heart Failure Sister     Allergies  Allergen Reactions   Penicillins Hives and Rash    Has patient had a PCN reaction causing immediate rash, facial/tongue/throat swelling, SOB or lightheadedness with hypotension: Yes Has patient had a PCN reaction causing severe rash involving mucus membranes or skin necrosis: No Has patient had a PCN reaction that required hospitalization No Has patient had a PCN reaction occurring within the last 10 years: No If all of the above answers are NO, then may proceed with Cephalosporin use.   Metronidazole Rash    Other reaction(s): Skin Rashes, Hives    Outpatient Medications Prior to Visit  Medication Sig   acetaminophen  (TYLENOL ) 500 MG tablet Take 1,000 mg by mouth every 6 (six) hours as needed.   albuterol  (PROVENTIL ) (2.5 MG/3ML) 0.083% nebulizer solution Take 3 mLs (2.5 mg total) by nebulization every 4 (four) hours as needed for wheezing or shortness of breath.   albuterol  (VENTOLIN  HFA) 108 (90 Base) MCG/ACT inhaler Inhale 1-2 puffs into the lungs every 4 (four) hours as needed for wheezing or shortness of breath.   aspirin  81 MG chewable tablet Chew 81 mg by mouth daily.   atorvastatin  (LIPITOR) 20 MG tablet TAKE 1 TABLET BY MOUTH EVERY EVENING   clopidogrel  (PLAVIX ) 75 MG tablet Take 1 tablet (75 mg total) by mouth daily.   escitalopram  (LEXAPRO ) 10 MG tablet TAKE 1 TABLET BY MOUTH DAILY   famotidine  (PEPCID ) 20 MG tablet TAKE 1 TABLET  BY MOUTH TWICE A DAY   FARXIGA  10 MG TABS tablet TAKE 1 TABLET BY MOUTH DAILY BEFORE BREAKFAST   gabapentin (NEURONTIN) 300 MG capsule Take 900 mg by mouth 2 (two) times daily.   hydrOXYzine  (ATARAX /VISTARIL ) 50 MG tablet Take 50 mg by mouth at bedtime.   LATUDA 60 MG TABS Take 1 tablet by mouth at bedtime.   losartan  (COZAAR ) 100 MG tablet Take 1 tablet (100 mg total) by mouth daily.   montelukast  (SINGULAIR ) 10 MG tablet TAKE 1 TABLET BY MOUTH DAILY   naproxen  (NAPROSYN ) 500 MG tablet Take 1 tablet (500 mg total) by mouth 2 (two) times daily with a meal.   oxybutynin  (DITROPAN -XL) 10 MG 24 hr tablet  TAKE 1 TABLET BY MOUTH DAILY   pantoprazole  (PROTONIX ) 40 MG tablet TAKE 1 TABLET BY MOUTH DAILY   REXULTI 2 MG TABS tablet Take 2 mg by mouth daily.   spironolactone (ALDACTONE) 25 MG tablet TAKE 1 TABLET BY MOUTH DAILY   tirzepatide  (ZEPBOUND ) 7.5 MG/0.5ML Pen Inject 7.5 mg into the skin once a week.   zolpidem  (AMBIEN  CR) 6.25 MG CR tablet TAKE 1 TABLET BY MOUTH AT BEDTIME AS NEEDED FOR INSOMNIA   [DISCONTINUED] benzonatate  (TESSALON  PERLES) 100 MG capsule Take 1 capsule (100 mg total) by mouth 3 (three) times daily as needed for cough.   [DISCONTINUED] cetirizine  (ZYRTEC ) 10 MG tablet TAKE 1 TABLET BY MOUTH NIGHTLY AT BEDTIME FOR SEASONAL ALLERGIES (Patient not taking: Reported on 07/10/2024)   No facility-administered medications prior to visit.    Review of Systems  Constitutional: Negative.   HENT:  Positive for congestion. Negative for ear pain, sinus pain and sore throat.   Eyes: Negative.   Respiratory:  Positive for cough. Negative for shortness of breath.   Cardiovascular: Negative.  Negative for chest pain.  Gastrointestinal: Negative.  Negative for abdominal pain, constipation and diarrhea.  Genitourinary: Negative.   Musculoskeletal:  Negative for joint pain and myalgias.  Skin: Negative.   Neurological: Negative.  Negative for dizziness and headaches.  Endo/Heme/Allergies:  Negative.   All other systems reviewed and are negative.      Objective:   BP 106/68   Pulse 77   Ht 5' 1 (1.549 m)   Wt 295 lb 12.8 oz (134.2 kg)   SpO2 97%   BMI 55.89 kg/m   Vitals:   07/10/24 1256  BP: 106/68  Pulse: 77  Height: 5' 1 (1.549 m)  Weight: 295 lb 12.8 oz (134.2 kg)  SpO2: 97%  BMI (Calculated): 55.92    Physical Exam Vitals and nursing note reviewed.  Constitutional:      Appearance: Normal appearance. She is normal weight.  HENT:     Head: Normocephalic and atraumatic.     Nose: Nose normal.     Mouth/Throat:     Mouth: Mucous membranes are moist.  Eyes:     Extraocular Movements: Extraocular movements intact.     Conjunctiva/sclera: Conjunctivae normal.     Pupils: Pupils are equal, round, and reactive to light.  Cardiovascular:     Rate and Rhythm: Normal rate and regular rhythm.     Pulses: Normal pulses.     Heart sounds: Normal heart sounds.  Pulmonary:     Effort: Pulmonary effort is normal.     Breath sounds: No stridor. Wheezing present. No rhonchi or rales.  Abdominal:     General: Abdomen is flat. Bowel sounds are normal.     Palpations: Abdomen is soft.  Musculoskeletal:        General: Normal range of motion.     Cervical back: Normal range of motion.  Skin:    General: Skin is warm and dry.  Neurological:     General: No focal deficit present.     Mental Status: She is alert and oriented to person, place, and time.  Psychiatric:        Mood and Affect: Mood normal.        Behavior: Behavior normal.        Thought Content: Thought content normal.        Judgment: Judgment normal.      No results found for any visits on 07/10/24.  Recent Results (from  the past 2160 hours)  Lipid Profile     Status: None   Collection Time: 05/22/24 11:24 AM  Result Value Ref Range   Cholesterol, Total 152 100 - 199 mg/dL   Triglycerides 70 0 - 149 mg/dL   HDL 80 >60 mg/dL   VLDL Cholesterol Cal 14 5 - 40 mg/dL   LDL Chol Calc  (NIH) 58 0 - 99 mg/dL   Chol/HDL Ratio 1.9 0.0 - 4.4 ratio    Comment:                                   T. Chol/HDL Ratio                                             Men  Women                               1/2 Avg.Risk  3.4    3.3                                   Avg.Risk  5.0    4.4                                2X Avg.Risk  9.6    7.1                                3X Avg.Risk 23.4   11.0   CMP14+EGFR     Status: Abnormal   Collection Time: 05/22/24 11:24 AM  Result Value Ref Range   Glucose 139 (H) 70 - 99 mg/dL   BUN 16 8 - 27 mg/dL   Creatinine, Ser 8.51 (H) 0.57 - 1.00 mg/dL   eGFR 40 (L) >40 fO/fpw/8.26   BUN/Creatinine Ratio 11 (L) 12 - 28   Sodium 142 134 - 144 mmol/L   Potassium 4.4 3.5 - 5.2 mmol/L   Chloride 104 96 - 106 mmol/L   CO2 23 20 - 29 mmol/L   Calcium  9.7 8.7 - 10.3 mg/dL   Total Protein 6.6 6.0 - 8.5 g/dL   Albumin 4.4 3.8 - 4.9 g/dL   Globulin, Total 2.2 1.5 - 4.5 g/dL   Bilirubin Total 0.8 0.0 - 1.2 mg/dL   Alkaline Phosphatase 53 49 - 135 IU/L    Comment:               **Please note reference interval change**   AST 17 0 - 40 IU/L   ALT 12 0 - 32 IU/L  TSH     Status: None   Collection Time: 05/22/24 11:24 AM  Result Value Ref Range   TSH 1.990 0.450 - 4.500 uIU/mL  Hemoglobin A1c     Status: None   Collection Time: 05/22/24 11:24 AM  Result Value Ref Range   Hgb A1c MFr Bld 5.6 4.8 - 5.6 %    Comment:          Prediabetes: 5.7 - 6.4          Diabetes: >6.4  Glycemic control for adults with diabetes: <7.0    Est. average glucose Bld gHb Est-mCnc 114 mg/dL  CBC with Differential/Platelet     Status: Abnormal   Collection Time: 05/22/24 11:24 AM  Result Value Ref Range   WBC 4.7 3.4 - 10.8 x10E3/uL   RBC 4.55 3.77 - 5.28 x10E6/uL   Hemoglobin 12.8 11.1 - 15.9 g/dL   Hematocrit 59.9 65.9 - 46.6 %   MCV 88 79 - 97 fL   MCH 28.1 26.6 - 33.0 pg   MCHC 32.0 31.5 - 35.7 g/dL   RDW 84.1 (H) 88.2 - 84.5 %   Platelets 230 150 - 450  x10E3/uL   Neutrophils 39 Not Estab. %   Lymphs 45 Not Estab. %   Monocytes 8 Not Estab. %   Eos 7 Not Estab. %   Basos 1 Not Estab. %   Neutrophils Absolute 1.8 1.4 - 7.0 x10E3/uL   Lymphocytes Absolute 2.1 0.7 - 3.1 x10E3/uL   Monocytes Absolute 0.4 0.1 - 0.9 x10E3/uL   EOS (ABSOLUTE) 0.3 0.0 - 0.4 x10E3/uL   Basophils Absolute 0.1 0.0 - 0.2 x10E3/uL   Immature Granulocytes 0 Not Estab. %   Immature Grans (Abs) 0.0 0.0 - 0.1 x10E3/uL  Iron, TIBC and Ferritin Panel     Status: None   Collection Time: 05/22/24 11:24 AM  Result Value Ref Range   Total Iron Binding Capacity 271 250 - 450 ug/dL   UIBC 797 868 - 574 ug/dL   Iron 69 27 - 840 ug/dL   Iron Saturation 25 15 - 55 %   Ferritin 119 15 - 150 ng/mL      Assessment & Plan:  Use nebulizer Antihistamine Flonase  Tessalon  pearls Drink plenty of water  Problem List Items Addressed This Visit       Cardiovascular and Mediastinum   Hypertension     Respiratory   Mild intermittent asthma without complication   Seasonal allergic rhinitis due to pollen - Primary     Genitourinary   Stage 3a chronic kidney disease (HCC)    Return if symptoms worsen or fail to improve, for as scheduled.   Total time spent: 25 minutes. This time includes review of previous notes and results and patient face to face interaction during today's visit.    Jeoffrey Pollen, NP  07/10/2024   This document may have been prepared by Dragon Voice Recognition software and as such may include unintentional dictation errors.

## 2024-07-19 ENCOUNTER — Other Ambulatory Visit: Payer: Self-pay | Admitting: Cardiology

## 2024-07-19 ENCOUNTER — Telehealth: Payer: Self-pay

## 2024-07-19 MED ORDER — ZEPBOUND 10 MG/0.5ML ~~LOC~~ SOAJ: 10.0000 mg | SUBCUTANEOUS | 5 refills | Status: DC

## 2024-07-19 NOTE — Progress Notes (Signed)
 SABRA

## 2024-07-19 NOTE — Telephone Encounter (Signed)
 Patient called asking for increased dose of injection to be sent to the pharmcacy

## 2024-07-20 ENCOUNTER — Other Ambulatory Visit: Payer: Self-pay | Admitting: Cardiology

## 2024-07-24 ENCOUNTER — Other Ambulatory Visit: Payer: Self-pay | Admitting: Cardiovascular Disease

## 2024-07-24 ENCOUNTER — Other Ambulatory Visit: Payer: Self-pay | Admitting: Cardiology

## 2024-07-24 ENCOUNTER — Other Ambulatory Visit: Payer: Self-pay | Admitting: Internal Medicine

## 2024-07-24 ENCOUNTER — Ambulatory Visit: Admitting: Cardiovascular Disease

## 2024-07-24 DIAGNOSIS — F5101 Primary insomnia: Secondary | ICD-10-CM

## 2024-07-25 ENCOUNTER — Telehealth: Payer: Self-pay

## 2024-07-25 NOTE — Telephone Encounter (Signed)
 Pt is requesting an increase in her dose of her Zepbound . Please advise.

## 2024-07-26 ENCOUNTER — Other Ambulatory Visit: Payer: Self-pay | Admitting: Cardiology

## 2024-07-26 ENCOUNTER — Other Ambulatory Visit: Payer: Self-pay | Admitting: Internal Medicine

## 2024-07-26 DIAGNOSIS — F5101 Primary insomnia: Secondary | ICD-10-CM

## 2024-07-26 MED ORDER — ZEPBOUND 12.5 MG/0.5ML ~~LOC~~ SOAJ: 12.5000 mg | SUBCUTANEOUS | 5 refills | Status: DC

## 2024-07-26 MED ORDER — ZOLPIDEM TARTRATE ER 6.25 MG PO TBCR: EXTENDED_RELEASE_TABLET | ORAL | 0 refills | Status: DC

## 2024-07-31 ENCOUNTER — Telehealth: Payer: Self-pay

## 2024-07-31 NOTE — Progress Notes (Signed)
   07/31/2024  Patient ID: Grayce JONELLE Dollar, female   DOB: December 30, 1960, 63 y.o.   MRN: [id number]  Pharmacy Quality Measure Review  This patient is appearing on a report for being at risk of failing the adherence measure for diabetes medications this calendar year.   Medication: Farxiga  Last fill date: 07/18/24 for 30 day supply  Insurance report was not up to date. No action needed at this time.   Jon VEAR Lindau, PharmD Clinical Pharmacist [phone]

## 2024-07-31 NOTE — Telephone Encounter (Signed)
 Pt states she needs something sent in to help bowel movement.

## 2024-07-31 NOTE — Telephone Encounter (Signed)
Pt informed

## 2024-08-06 ENCOUNTER — Telehealth: Payer: Self-pay

## 2024-08-06 ENCOUNTER — Ambulatory Visit: Admitting: Cardiovascular Disease

## 2024-08-06 NOTE — Telephone Encounter (Signed)
 Patient called asking for something to be called in for her for a cough

## 2024-08-07 ENCOUNTER — Other Ambulatory Visit: Payer: Self-pay

## 2024-08-07 MED ORDER — BENZONATATE 100 MG PO CAPS: 100.0000 mg | ORAL_CAPSULE | Freq: Three times a day (TID) | ORAL | 1 refills | Status: AC | PRN

## 2024-08-07 NOTE — Telephone Encounter (Signed)
 Appt already schdeuled for 12/8 for CPE

## 2024-08-07 NOTE — Telephone Encounter (Signed)
 Spoke with patient this AM and took care of this

## 2024-08-13 ENCOUNTER — Ambulatory Visit: Payer: Self-pay | Admitting: Cardiology

## 2024-08-13 ENCOUNTER — Encounter: Payer: Self-pay | Admitting: Cardiology

## 2024-08-13 ENCOUNTER — Ambulatory Visit (INDEPENDENT_AMBULATORY_CARE_PROVIDER_SITE_OTHER): Admitting: Cardiology

## 2024-08-13 VITALS — BP 108/76 | HR 81 | Ht 61.0 in | Wt 293.0 lb

## 2024-08-13 DIAGNOSIS — Z1211 Encounter for screening for malignant neoplasm of colon: Secondary | ICD-10-CM

## 2024-08-13 DIAGNOSIS — N1831 Chronic kidney disease, stage 3a: Secondary | ICD-10-CM | POA: Diagnosis not present

## 2024-08-13 DIAGNOSIS — R7303 Prediabetes: Secondary | ICD-10-CM

## 2024-08-13 DIAGNOSIS — E785 Hyperlipidemia, unspecified: Secondary | ICD-10-CM

## 2024-08-13 DIAGNOSIS — I10 Essential (primary) hypertension: Secondary | ICD-10-CM

## 2024-08-13 DIAGNOSIS — Z0001 Encounter for general adult medical examination with abnormal findings: Secondary | ICD-10-CM

## 2024-08-13 DIAGNOSIS — Z1329 Encounter for screening for other suspected endocrine disorder: Secondary | ICD-10-CM

## 2024-08-13 DIAGNOSIS — Z124 Encounter for screening for malignant neoplasm of cervix: Secondary | ICD-10-CM

## 2024-08-13 DIAGNOSIS — Z0000 Encounter for general adult medical examination without abnormal findings: Secondary | ICD-10-CM

## 2024-08-13 LAB — POC CREATINE & ALBUMIN,URINE
Creatinine, POC: 100 mg/dL
Microalbumin Ur, POC: 30 mg/L

## 2024-08-13 NOTE — Progress Notes (Signed)
 Complete physical exam  Patient: Lindsay Cooke   DOB: 1961/05/03   63 y.o. Female  MRN: [id number]  Subjective:    Chief Complaint  Patient presents with   Annual Exam    CPE, no PAP    Lindsay Cooke is a 63 y.o. female who presents today for a complete physical exam. She reports consuming a general diet. The patient does not participate in regular exercise at present. She generally feels fairly well. She reports sleeping well. She does not have additional problems to discuss today.    Most recent fall risk assessment:    04/11/2020   10:18 AM  Fall Risk   Falls in the past year? 1  Number falls in past yr: 0  Injury with Fall? 0      Data saved with a previous flowsheet row definition     Most recent depression screenings:    05/30/2017   10:34 AM  PHQ 2/9 Scores  PHQ - 2 Score 0    Vision:Within last year and Dental: No current dental problems and Receives regular dental care  Past Medical History:  Diagnosis Date   Allergy    Anemia    Anxiety    Arthritis    Asthma    Benign essential hypertension    Calcaneal spur, left    Class 3 severe obesity with serious comorbidity and body mass index (BMI) of 60.0 to 69.9 in adult, unspecified obesity type (HCC)    Depression    Dyspnea    Dysrhythmia    Equinus deformity of both feet    GERD (gastroesophageal reflux disease)    Hyperlipidemia    Left foot pain    Lumbar spondylosis    Obesity    Pneumonia    Pre-diabetes    Reflux    Right leg DVT (HCC)    Sleep apnea    no cpap   Stroke (HCC)    Tendonitis, Achilles, left     Past Surgical History:  Procedure Laterality Date   ABDOMINAL HYSTERECTOMY     ACHILLES TENDON SURGERY Left 12/15/2023   Procedure: REPAIR, TENDON, ACHILLES;  Surgeon: Lennie Barter, DPM;  Location: [facility] ORS;  Service: Orthopedics/Podiatry;  Laterality: Left;   CHOLECYSTECTOMY     COLONOSCOPY WITH PROPOFOL  N/A 06/16/2017   Procedure: COLONOSCOPY WITH PROPOFOL ;  Surgeon:  Unk Corinn Skiff, MD;  Location: [facility] ENDOSCOPY;  Service: Gastroenterology;  Laterality: N/A;   COLONOSCOPY WITH PROPOFOL  N/A 11/10/2020   Procedure: COLONOSCOPY WITH PROPOFOL ;  Surgeon: Unk Corinn Skiff, MD;  Location: [facility] ENDOSCOPY;  Service: Gastroenterology;  Laterality: N/A;   GASTROC RECESSION EXTREMITY Left 12/15/2023   Procedure: RECESSION, TENDON, GASTROCNEMIUS;  Surgeon: Lennie Barter, DPM;  Location: [facility] ORS;  Service: Orthopedics/Podiatry;  Laterality: Left;   HEEL SPUR EXCISION     HERNIA REPAIR     KELOID EXCISION     OSTECTOMY Left 12/15/2023   Procedure: OSTECTOMY;  Surgeon: Lennie Barter, DPM;  Location: [facility];  Service: Orthopedics/Podiatry;  Laterality: Left;   TENDON TRANSFER Left 12/15/2023   Procedure: TRANSFER, TENDON;  Surgeon: Lennie Barter, DPM;  Location: [facility];  Service: Orthopedics/Podiatry;  Laterality: Left;    Family History  Problem Relation Age of Onset   Breast cancer Sister    Heart disease Mother    Congestive Heart Failure Sister     Social History   Socioeconomic History   Marital status: Single    Spouse name: Not on file   Number of  children: Not on file   Years of education: Not on file   Highest education level: Not on file  Occupational History   Not on file  Tobacco Use   Smoking status: Never   Smokeless tobacco: Never  Vaping Use   Vaping status: Never Used  Substance and Sexual Activity   Alcohol use: No    Alcohol/week: 0.0 standard drinks of alcohol   Drug use: No   Sexual activity: Never  Other Topics Concern   Not on file  Social History Narrative   Lives with daughter   Social Drivers of Health   Financial Resource Strain: Low Risk  (08/01/2024)   Received [facility]   Overall Financial Resource Strain (CARDIA)    Difficulty of Paying Living Expenses: Not very hard  Recent Concern: Financial Resource Strain - High Risk (06/21/2024)   Received from [facility]    Overall Financial Resource Strain (CARDIA)    Difficulty of Paying Living Expenses: Very hard  Food Insecurity: Food Insecurity Present (08/01/2024)   Received from [facility]   Hunger Vital Sign    Within the past 12 months, you worried that your food would run out before you got the money to buy more.: Never true    Within the past 12 months, the food you bought just didn't last and you didn't have money to get more.: Sometimes true  Transportation Needs: No Transportation Needs (08/01/2024)   Received from [facility] - Transportation    In the past 12 months, has lack of transportation kept you from medical appointments or from getting medications?: No    Lack of Transportation (Non-Medical): No  Physical Activity: Not on file  Stress: Not on file  Social Connections: Not on file  Intimate Partner Violence: Not on file    Outpatient Medications Prior to Visit  Medication Sig   acetaminophen  (TYLENOL ) 500 MG tablet Take 1,000 mg by mouth every 6 (six) hours as needed.   albuterol  (PROVENTIL ) (2.5 MG/3ML) 0.083% nebulizer solution Take 3 mLs (2.5 mg total) by nebulization every 4 (four) hours as needed for wheezing or shortness of breath.   albuterol  (VENTOLIN  HFA) 108 (90 Base) MCG/ACT inhaler Inhale 1-2 puffs into the lungs every 4 (four) hours as needed for wheezing or shortness of breath.   aspirin  81 MG chewable tablet Chew 81 mg by mouth daily.   atorvastatin  (LIPITOR) 20 MG tablet TAKE 1 TABLET BY MOUTH EVERY EVENING   benzonatate  (TESSALON  PERLES) 100 MG capsule Take 1 capsule (100 mg total) by mouth 3 (three) times daily as needed for cough.   celecoxib (CELEBREX) 100 MG capsule Take 100 mg by mouth 2 (two) times daily.   cetirizine  (ZYRTEC ) 10 MG tablet Take 1 tablet (10 mg total) by mouth daily.   clopidogrel  (PLAVIX ) 75 MG tablet Take 1 tablet (75 mg total) by mouth daily.   escitalopram  (LEXAPRO ) 10 MG tablet TAKE 1 TABLET  BY MOUTH DAILY   famotidine  (PEPCID ) 20 MG tablet TAKE 1 TABLET BY MOUTH TWICE A DAY   FARXIGA  10 MG TABS tablet TAKE 1 TABLET BY MOUTH DAILY BEFORE BREAKFAST   fluticasone  (FLONASE ) 50 MCG/ACT nasal spray Place 1 spray into both nostrils daily.   gabapentin (NEURONTIN) 300 MG capsule Take 900 mg by mouth 2 (two) times daily.   hydrOXYzine  (ATARAX /VISTARIL ) 50 MG tablet Take 50 mg by mouth at bedtime.   LATUDA 60 MG TABS Take  1 tablet by mouth at bedtime.   losartan  (COZAAR ) 100 MG tablet Take 1 tablet (100 mg total) by mouth daily.   montelukast  (SINGULAIR ) 10 MG tablet TAKE 1 TABLET BY MOUTH DAILY   naproxen  (NAPROSYN ) 500 MG tablet Take 1 tablet (500 mg total) by mouth 2 (two) times daily with a meal.   oxybutynin  (DITROPAN -XL) 10 MG 24 hr tablet TAKE 1 TABLET BY MOUTH DAILY   pantoprazole  (PROTONIX ) 40 MG tablet TAKE 1 TABLET BY MOUTH DAILY   REXULTI 2 MG TABS tablet Take 2 mg by mouth daily.   spironolactone (ALDACTONE) 25 MG tablet TAKE 1 TABLET BY MOUTH DAILY   tirzepatide  (ZEPBOUND ) 12.5 MG/0.5ML Pen Inject 12.5 mg into the skin once a week.   zolpidem  (AMBIEN  CR) 6.25 MG CR tablet TAKE 1 TABLET BY MOUTH AT BEDTIME AS NEEDED FOR INSOMNIA   No facility-administered medications prior to visit.    Review of Systems  Constitutional: Negative.   HENT: Negative.    Eyes: Negative.   Respiratory:  Positive for cough. Negative for shortness of breath.   Cardiovascular: Negative.  Negative for chest pain.  Gastrointestinal: Negative.  Negative for abdominal pain, constipation and diarrhea.  Genitourinary: Negative.   Musculoskeletal:  Negative for joint pain and myalgias.  Skin: Negative.   Neurological: Negative.  Negative for dizziness and headaches.  Endo/Heme/Allergies: Negative.   All other systems reviewed and are negative.       Objective:     BP 108/76   Pulse 81   Ht 5' 1 (1.549 m)   Wt 293 lb (132.9 kg)   SpO2 97%   BMI 55.36 kg/m  BP Readings from Last 3  Encounters:  08/13/24 108/76  07/10/24 106/68  07/06/24 108/72      Physical Exam Vitals and nursing note reviewed.  Constitutional:      Appearance: Normal appearance. She is normal weight.  HENT:     Head: Normocephalic and atraumatic.     Nose: Nose normal.     Mouth/Throat:     Mouth: Mucous membranes are moist.  Eyes:     Extraocular Movements: Extraocular movements intact.     Conjunctiva/sclera: Conjunctivae normal.     Pupils: Pupils are equal, round, and reactive to light.  Cardiovascular:     Rate and Rhythm: Normal rate and regular rhythm.     Pulses: Normal pulses.     Heart sounds: Normal heart sounds.  Pulmonary:     Effort: Pulmonary effort is normal.     Breath sounds: Normal breath sounds.  Abdominal:     General: Abdomen is flat. Bowel sounds are normal.     Palpations: Abdomen is soft.  Musculoskeletal:        General: Normal range of motion.     Cervical back: Normal range of motion.  Skin:    General: Skin is warm and dry.  Neurological:     General: No focal deficit present.     Mental Status: She is alert and oriented to person, place, and time.  Psychiatric:        Mood and Affect: Mood normal.        Behavior: Behavior normal.        Thought Content: Thought content normal.        Judgment: Judgment normal.      Results for orders placed or performed in visit on 08/13/24  POC CREATINE & ALBUMIN,URINE  Result Value Ref Range   Microalbumin Ur, POC 30 mg/L  Creatinine, POC 100 mg/dL   Albumin/Creatinine Ratio, Urine, POC 30-300     Recent Results (from the past 2160 hours)  Lipid Profile     Status: None   Collection Time: 05/22/24 11:24 AM  Result Value Ref Range   Cholesterol, Total 152 100 - 199 mg/dL   Triglycerides 70 0 - 149 mg/dL   HDL 80 >60 mg/dL   VLDL Cholesterol Cal 14 5 - 40 mg/dL   LDL Chol Calc (NIH) 58 0 - 99 mg/dL   Chol/HDL Ratio 1.9 0.0 - 4.4 ratio    Comment:                                   T. Chol/HDL  Ratio                                             Men  Women                               1/2 Avg.Risk  3.4    3.3                                   Avg.Risk  5.0    4.4                                2X Avg.Risk  9.6    7.1                                3X Avg.Risk 23.4   11.0   CMP14+EGFR     Status: Abnormal   Collection Time: 05/22/24 11:24 AM  Result Value Ref Range   Glucose 139 (H) 70 - 99 mg/dL   BUN 16 8 - 27 mg/dL   Creatinine, Ser 8.51 (H) 0.57 - 1.00 mg/dL   eGFR 40 (L) >40 fO/fpw/8.26   BUN/Creatinine Ratio 11 (L) 12 - 28   Sodium 142 134 - 144 mmol/L   Potassium 4.4 3.5 - 5.2 mmol/L   Chloride 104 96 - 106 mmol/L   CO2 23 20 - 29 mmol/L   Calcium  9.7 8.7 - 10.3 mg/dL   Total Protein 6.6 6.0 - 8.5 g/dL   Albumin 4.4 3.8 - 4.9 g/dL   Globulin, Total 2.2 1.5 - 4.5 g/dL   Bilirubin Total 0.8 0.0 - 1.2 mg/dL   Alkaline Phosphatase 53 49 - 135 IU/L    Comment:               **Please note reference interval change**   AST 17 0 - 40 IU/L   ALT 12 0 - 32 IU/L  TSH     Status: None   Collection Time: 05/22/24 11:24 AM  Result Value Ref Range   TSH 1.990 0.450 - 4.500 uIU/mL  Hemoglobin A1c     Status: None   Collection Time: 05/22/24 11:24 AM  Result Value Ref Range   Hgb A1c MFr Bld 5.6 4.8 - 5.6 %    Comment:  Prediabetes: 5.7 - 6.4          Diabetes: >6.4          Glycemic control for adults with diabetes: <7.0    Est. average glucose Bld gHb Est-mCnc 114 mg/dL  CBC with Differential/Platelet     Status: Abnormal   Collection Time: 05/22/24 11:24 AM  Result Value Ref Range   WBC 4.7 3.4 - 10.8 x10E3/uL   RBC 4.55 3.77 - 5.28 x10E6/uL   Hemoglobin 12.8 11.1 - 15.9 g/dL   Hematocrit 59.9 65.9 - 46.6 %   MCV 88 79 - 97 fL   MCH 28.1 26.6 - 33.0 pg   MCHC 32.0 31.5 - 35.7 g/dL   RDW 84.1 (H) 88.2 - 84.5 %   Platelets 230 150 - 450 x10E3/uL   Neutrophils 39 Not Estab. %   Lymphs 45 Not Estab. %   Monocytes 8 Not Estab. %   Eos 7 Not Estab. %    Basos 1 Not Estab. %   Neutrophils Absolute 1.8 1.4 - 7.0 x10E3/uL   Lymphocytes Absolute 2.1 0.7 - 3.1 x10E3/uL   Monocytes Absolute 0.4 0.1 - 0.9 x10E3/uL   EOS (ABSOLUTE) 0.3 0.0 - 0.4 x10E3/uL   Basophils Absolute 0.1 0.0 - 0.2 x10E3/uL   Immature Granulocytes 0 Not Estab. %   Immature Grans (Abs) 0.0 0.0 - 0.1 x10E3/uL  Iron, TIBC and Ferritin Panel     Status: None   Collection Time: 05/22/24 11:24 AM  Result Value Ref Range   Total Iron Binding Capacity 271 250 - 450 ug/dL   UIBC 797 868 - 574 ug/dL   Iron 69 27 - 840 ug/dL   Iron Saturation 25 15 - 55 %   Ferritin 119 15 - 150 ng/mL  POC CREATINE & ALBUMIN,URINE     Status: Abnormal   Collection Time: 08/13/24  9:39 AM  Result Value Ref Range   Microalbumin Ur, POC 30 mg/L   Creatinine, POC 100 mg/dL   Albumin/Creatinine Ratio, Urine, POC 30-300         Assessment & Plan:    Routine Health Maintenance and Physical Exam  Immunization History  Administered Date(s) Administered   Influenza Whole 07/21/2007, 06/19/2008   Influenza, Mdck, Trivalent,PF 6+ MOS(egg free) 07/04/2023, 06/07/2024   Influenza,inj,Quad PF,6+ Mos 05/05/2017, 06/01/2019   Influenza,inj,quad, With Preservative 06/07/2016   PFIZER(Purple Top)SARS-COV-2 Vaccination 12/07/2019, 12/28/2019    Health Maintenance  Topic Date Due   Medicare Annual Wellness (AWV)  Never done   Hepatitis C Screening  Never done   DTaP/Tdap/Td (1 - Tdap) Never done   Pneumococcal Vaccine: 50+ Years (1 of 2 - PCV) Never done   Cervical Cancer Screening (HPV/Pap Cotest)  Never done   Zoster Vaccines- Shingrix (1 of 2) Never done   Colonoscopy  11/11/2023   COVID-19 Vaccine (3 - 2025-26 season) 05/07/2024   Mammogram  01/17/2026   HIV Screening  Completed   Hepatitis B Vaccines 19-59 Average Risk  Aged Out   HPV VACCINES  Aged Out   Meningococcal B Vaccine  Aged Out    Discussed health benefits of physical activity, and encouraged her to engage in regular exercise  appropriate for her age and condition.  Pap smear 09/2021 NEGATIVE FOR INTRAEPITHELIAL LESION OR MALIGNANCY.   Problem List Items Addressed This Visit       Cardiovascular and Mediastinum   Hypertension   Relevant Orders   CMP14+EGFR     Genitourinary   Stage 3a chronic  kidney disease (HCC)   Relevant Orders   CMP14+EGFR   CBC with Differential/Platelet     Other   Dyslipidemia   Relevant Orders   Lipid Profile   Pre-diabetes   Relevant Orders   POC CREATINE & ALBUMIN,URINE (Completed)   Hemoglobin A1c   Encounter for annual health examination - Primary   Other Visit Diagnoses       Thyroid  disorder screening       Relevant Orders   TSH     Colon cancer screening       Relevant Orders   Ambulatory referral to Gastroenterology     Cervical cancer screening          Return in about 4 months (around 12/12/2024) for fasting lab work prior.     Jeoffrey Pollen, NP  08/13/2024   This document may have been prepared by [facility] Voice Recognition software and as such may include unintentional dictation errors.

## 2024-08-14 LAB — LIPID PANEL
Chol/HDL Ratio: 2.4 ratio (ref 0.0–4.4)
Cholesterol, Total: 149 mg/dL (ref 100–199)
HDL: 63 mg/dL (ref >39)
LDL Chol Calc (NIH): 72 mg/dL (ref 0–99)
Triglycerides: 68 mg/dL (ref 0–149)
VLDL Cholesterol Cal: 14 mg/dL (ref 5–40)

## 2024-08-14 LAB — CBC WITH DIFFERENTIAL/PLATELET
Basophils Absolute: 0.1 x10E3/uL (ref 0.0–0.2)
Basos: 1 %
EOS (ABSOLUTE): 0.8 x10E3/uL — ABNORMAL HIGH (ref 0.0–0.4)
Eos: 13 %
Hematocrit: 42.1 % (ref 34.0–46.6)
Hemoglobin: 13.4 g/dL (ref 11.1–15.9)
Immature Grans (Abs): 0 x10E3/uL (ref 0.0–0.1)
Immature Granulocytes: 0 %
Lymphocytes Absolute: 2.3 x10E3/uL (ref 0.7–3.1)
Lymphs: 40 %
MCH: 28.6 pg (ref 26.6–33.0)
MCHC: 31.8 g/dL (ref 31.5–35.7)
MCV: 90 fL (ref 79–97)
Monocytes Absolute: 0.5 x10E3/uL (ref 0.1–0.9)
Monocytes: 9 %
Neutrophils Absolute: 2.1 x10E3/uL (ref 1.4–7.0)
Neutrophils: 37 %
Platelets: 250 x10E3/uL (ref 150–450)
RBC: 4.69 x10E6/uL (ref 3.77–5.28)
RDW: 16 % — ABNORMAL HIGH (ref 11.7–15.4)
WBC: 5.7 x10E3/uL (ref 3.4–10.8)

## 2024-08-14 LAB — CMP14+EGFR
ALT: 8 IU/L (ref 0–32)
AST: 14 IU/L (ref 0–40)
Albumin: 4.3 g/dL (ref 3.9–4.9)
Alkaline Phosphatase: 57 IU/L (ref 49–135)
BUN/Creatinine Ratio: 8 — ABNORMAL LOW (ref 12–28)
BUN: 10 mg/dL (ref 8–27)
Bilirubin Total: 0.7 mg/dL (ref 0.0–1.2)
CO2: 25 mmol/L (ref 20–29)
Calcium: 9.6 mg/dL (ref 8.7–10.3)
Chloride: 102 mmol/L (ref 96–106)
Creatinine, Ser: 1.3 mg/dL — ABNORMAL HIGH (ref 0.57–1.00)
Globulin, Total: 2.3 g/dL (ref 1.5–4.5)
Glucose: 99 mg/dL (ref 70–99)
Potassium: 4.5 mmol/L (ref 3.5–5.2)
Sodium: 141 mmol/L (ref 134–144)
Total Protein: 6.6 g/dL (ref 6.0–8.5)
eGFR: 47 mL/min/1.73 — ABNORMAL LOW (ref >59)

## 2024-08-14 LAB — TSH: TSH: 1.88 u[IU]/mL (ref 0.450–4.500)

## 2024-08-14 LAB — HEMOGLOBIN A1C
Est. average glucose Bld gHb Est-mCnc: 108 mg/dL
Hgb A1c MFr Bld: 5.4 % (ref 4.8–5.6)

## 2024-08-15 ENCOUNTER — Telehealth: Payer: Self-pay | Admitting: Cardiology

## 2024-08-15 NOTE — Telephone Encounter (Signed)
 Patient left VM inquiring about her recent lab results. Please advise.

## 2024-08-16 ENCOUNTER — Ambulatory Visit: Admitting: Cardiovascular Disease

## 2024-08-21 ENCOUNTER — Other Ambulatory Visit: Payer: Self-pay | Admitting: Cardiology

## 2024-08-23 ENCOUNTER — Other Ambulatory Visit: Payer: Self-pay

## 2024-08-23 ENCOUNTER — Telehealth: Payer: Self-pay

## 2024-08-23 NOTE — Telephone Encounter (Signed)
 Pt needs refill on incontinence supplies .

## 2024-08-23 NOTE — Telephone Encounter (Signed)
 Pt will be getting [employer] to send over orders.

## 2024-08-28 ENCOUNTER — Other Ambulatory Visit: Payer: Self-pay | Admitting: Internal Medicine

## 2024-08-28 ENCOUNTER — Telehealth: Payer: Self-pay

## 2024-08-28 ENCOUNTER — Other Ambulatory Visit: Payer: Self-pay

## 2024-08-28 DIAGNOSIS — Z860100 Personal history of colon polyps, unspecified: Secondary | ICD-10-CM

## 2024-08-28 DIAGNOSIS — F5101 Primary insomnia: Secondary | ICD-10-CM

## 2024-08-28 MED ORDER — PEG 3350-KCL-NA BICARB-NACL 420 G PO SOLR: ORAL | 0 refills | Status: DC

## 2024-08-28 NOTE — Telephone Encounter (Signed)
 Patient is not yet due for a colonoscopy. Patient and provider were notified since patient is not currently having any GI issues.

## 2024-09-10 ENCOUNTER — Ambulatory Visit: Admitting: Cardiology

## 2024-09-14 ENCOUNTER — Telehealth: Payer: Self-pay

## 2024-09-14 NOTE — Telephone Encounter (Signed)
 3:52p pt LM asking for call back

## 2024-09-17 ENCOUNTER — Other Ambulatory Visit: Payer: Self-pay | Admitting: Cardiology

## 2024-09-17 MED ORDER — ZEPBOUND 15 MG/0.5ML ~~LOC~~ SOAJ: 15.0000 mg | SUBCUTANEOUS | 3 refills | Status: AC

## 2024-09-18 ENCOUNTER — Telehealth: Payer: Self-pay

## 2024-09-18 NOTE — Telephone Encounter (Signed)
 Pt LM but did not state what she needed she needs a call back

## 2024-09-18 NOTE — Telephone Encounter (Signed)
 Will call the patient back once this has been done

## 2024-09-26 ENCOUNTER — Telehealth: Payer: Self-pay

## 2024-09-26 ENCOUNTER — Other Ambulatory Visit

## 2024-09-27 ENCOUNTER — Ambulatory Visit: Admitting: Cardiology

## 2024-09-27 NOTE — Telephone Encounter (Signed)
 PA has already been done, awaiting the confirmation from the insurance on approval or denial

## 2024-10-03 ENCOUNTER — Telehealth: Payer: Self-pay

## 2024-10-03 NOTE — Progress Notes (Signed)
 Lindsay Cooke                                          MRN: [id number]   10/03/2024   The [facility] Quality Team Specialist reviewed this patient medical record for the purposes of chart review for care gap closure. The following were reviewed: abstraction for care gap closure-glycemic status assessment.    [facility] Quality Team

## 2024-10-09 ENCOUNTER — Telehealth: Payer: Self-pay

## 2024-10-12 ENCOUNTER — Other Ambulatory Visit: Payer: Self-pay | Admitting: Cardiovascular Disease

## 2024-10-12 DIAGNOSIS — E782 Mixed hyperlipidemia: Secondary | ICD-10-CM

## 2024-10-12 DIAGNOSIS — R0602 Shortness of breath: Secondary | ICD-10-CM

## 2024-10-12 DIAGNOSIS — I10 Essential (primary) hypertension: Secondary | ICD-10-CM

## 2024-10-12 DIAGNOSIS — G4733 Obstructive sleep apnea (adult) (pediatric): Secondary | ICD-10-CM

## 2024-11-29 ENCOUNTER — Ambulatory Visit: Admit: 2024-11-29 | Admitting: Gastroenterology

## 2024-11-29 SURGERY — COLONOSCOPY
Anesthesia: General

## 2025-01-01 ENCOUNTER — Ambulatory Visit: Admitting: Internal Medicine
